# Patient Record
Sex: Female | Born: 1955 | Race: White | Hispanic: No | Smoking: Former smoker
Health system: Southern US, Community
[De-identification: ages and names within clinical notes are randomized; demographics above are authoritative.]

## PROBLEM LIST (undated history)

## (undated) DIAGNOSIS — I700 Atherosclerosis of aorta: Secondary | ICD-10-CM

## (undated) DIAGNOSIS — F419 Anxiety disorder, unspecified: Secondary | ICD-10-CM

## (undated) DIAGNOSIS — M5136 Other intervertebral disc degeneration, lumbar region: Secondary | ICD-10-CM

## (undated) DIAGNOSIS — I639 Cerebral infarction, unspecified: Secondary | ICD-10-CM

## (undated) DIAGNOSIS — E119 Type 2 diabetes mellitus without complications: Secondary | ICD-10-CM

## (undated) DIAGNOSIS — M549 Dorsalgia, unspecified: Secondary | ICD-10-CM

## (undated) DIAGNOSIS — M51369 Other intervertebral disc degeneration, lumbar region without mention of lumbar back pain or lower extremity pain: Secondary | ICD-10-CM

## (undated) DIAGNOSIS — F32A Depression, unspecified: Secondary | ICD-10-CM

## (undated) DIAGNOSIS — J449 Chronic obstructive pulmonary disease, unspecified: Secondary | ICD-10-CM

## (undated) DIAGNOSIS — E7800 Pure hypercholesterolemia, unspecified: Secondary | ICD-10-CM

## (undated) DIAGNOSIS — I10 Essential (primary) hypertension: Secondary | ICD-10-CM

## (undated) HISTORY — PX: CHOLECYSTECTOMY: SHX55

## (undated) HISTORY — PX: BREAST BIOPSY: SHX20

## (undated) HISTORY — PX: ABDOMINAL HYSTERECTOMY: SHX81

---

## 2004-03-02 ENCOUNTER — Ambulatory Visit: Payer: Self-pay | Admitting: General Surgery

## 2004-10-12 ENCOUNTER — Ambulatory Visit: Payer: Self-pay | Admitting: General Surgery

## 2008-04-21 ENCOUNTER — Ambulatory Visit: Payer: Self-pay | Admitting: Internal Medicine

## 2010-11-14 ENCOUNTER — Emergency Department: Payer: Self-pay | Admitting: Emergency Medicine

## 2010-12-10 ENCOUNTER — Emergency Department: Payer: Self-pay | Admitting: Emergency Medicine

## 2011-04-15 ENCOUNTER — Observation Stay: Payer: Self-pay | Admitting: Internal Medicine

## 2011-04-15 LAB — TROPONIN I
Troponin-I: <0.02 ng/mL
Troponin-I: <0.02 ng/mL

## 2011-04-15 LAB — CBC
MCH: 26.1 pg (ref 26.0–34.0)
MCHC: 32.2 g/dL (ref 32.0–36.0)
MCV: 81 fL (ref 80–100)
Platelet: 267 10*3/uL (ref 150–440)
RDW: 14.5 % (ref 11.5–14.5)
WBC: 10 10*3/uL (ref 3.6–11.0)

## 2011-04-15 LAB — CK TOTAL AND CKMB (NOT AT ARMC): CK, Total: 94 U/L (ref 21–215)

## 2011-04-15 LAB — BASIC METABOLIC PANEL
BUN: 17 mg/dL (ref 7–18)
Chloride: 97 mmol/L — ABNORMAL LOW (ref 98–107)
Co2: 26 mmol/L (ref 21–32)
Glucose: 170 mg/dL — ABNORMAL HIGH (ref 65–99)
Osmolality: 276 (ref 275–301)
Potassium: 4.5 mmol/L (ref 3.5–5.1)

## 2011-04-16 LAB — CBC WITH DIFFERENTIAL/PLATELET
Basophil #: 0.1 10*3/uL (ref 0.0–0.1)
Basophil %: 0.6 %
Eosinophil #: 0.1 10*3/uL (ref 0.0–0.7)
HCT: 34 % — ABNORMAL LOW (ref 35.0–47.0)
HGB: 11.1 g/dL — ABNORMAL LOW (ref 12.0–16.0)
Lymphocyte #: 2.4 10*3/uL (ref 1.0–3.6)
Lymphocyte %: 28.1 %
MCH: 26.1 pg (ref 26.0–34.0)
MCHC: 32.6 g/dL (ref 32.0–36.0)
MCV: 80 fL (ref 80–100)
Monocyte %: 5.5 %
Neutrophil %: 64.1 %
RDW: 14.9 % — ABNORMAL HIGH (ref 11.5–14.5)

## 2011-04-16 LAB — CK TOTAL AND CKMB (NOT AT ARMC)
CK, Total: 71 U/L (ref 21–215)
CK, Total: 73 U/L (ref 21–215)
CK-MB: 0.8 ng/mL (ref 0.5–3.6)
CK-MB: 0.9 ng/mL (ref 0.5–3.6)

## 2011-04-16 LAB — BASIC METABOLIC PANEL
Anion Gap: 12 (ref 7–16)
BUN: 15 mg/dL (ref 7–18)
Calcium, Total: 9 mg/dL (ref 8.5–10.1)
Creatinine: 1.18 mg/dL (ref 0.60–1.30)
EGFR (African American): >60
EGFR (Non-African Amer.): 50 — ABNORMAL LOW
Glucose: 144 mg/dL — ABNORMAL HIGH (ref 65–99)
Osmolality: 292 (ref 275–301)
Potassium: 4.3 mmol/L (ref 3.5–5.1)

## 2011-04-16 LAB — LIPID PANEL
Cholesterol: 118 mg/dL (ref 0–200)
Triglycerides: 105 mg/dL (ref 0–200)

## 2011-04-16 LAB — TROPONIN I: Troponin-I: <0.02 ng/mL

## 2012-12-10 ENCOUNTER — Emergency Department: Payer: Self-pay | Admitting: Emergency Medicine

## 2012-12-10 LAB — BASIC METABOLIC PANEL
Co2: 27 mmol/L (ref 21–32)
Creatinine: 0.8 mg/dL (ref 0.60–1.30)
EGFR (African American): >60
EGFR (Non-African Amer.): >60
Glucose: 227 mg/dL — ABNORMAL HIGH (ref 65–99)
Osmolality: 278 (ref 275–301)
Potassium: 4.1 mmol/L (ref 3.5–5.1)
Sodium: 135 mmol/L — ABNORMAL LOW (ref 136–145)

## 2012-12-10 LAB — CBC
HGB: 11.5 g/dL — ABNORMAL LOW (ref 12.0–16.0)
Platelet: 255 10*3/uL (ref 150–440)
RBC: 4.64 10*6/uL (ref 3.80–5.20)
RDW: 15.2 % — ABNORMAL HIGH (ref 11.5–14.5)
WBC: 11.4 10*3/uL — ABNORMAL HIGH (ref 3.6–11.0)

## 2012-12-10 LAB — TROPONIN I: Troponin-I: <0.02 ng/mL

## 2012-12-11 LAB — TROPONIN I: Troponin-I: <0.02 ng/mL

## 2013-01-20 IMAGING — CR DG CHEST 2V
1 series · 2 of 2 positions shown · non-contrast
Comparison: none

REASON FOR EXAM: SOB
COMMENTS:

PROCEDURE:     DXR - DXR CHEST PA (OR AP) AND LATERAL  - November 14, 2010  [DATE]
RESULT:     The lung fields are clear. No pneumonia, pneumothorax or pleural
effusion is seen. The heart size is normal. The mediastinal and osseous
structures show no significant abnormalities.

[Series 1: view not recorded · 0.17mm/px · 2 of 2 slices shown]
[im 1/2]
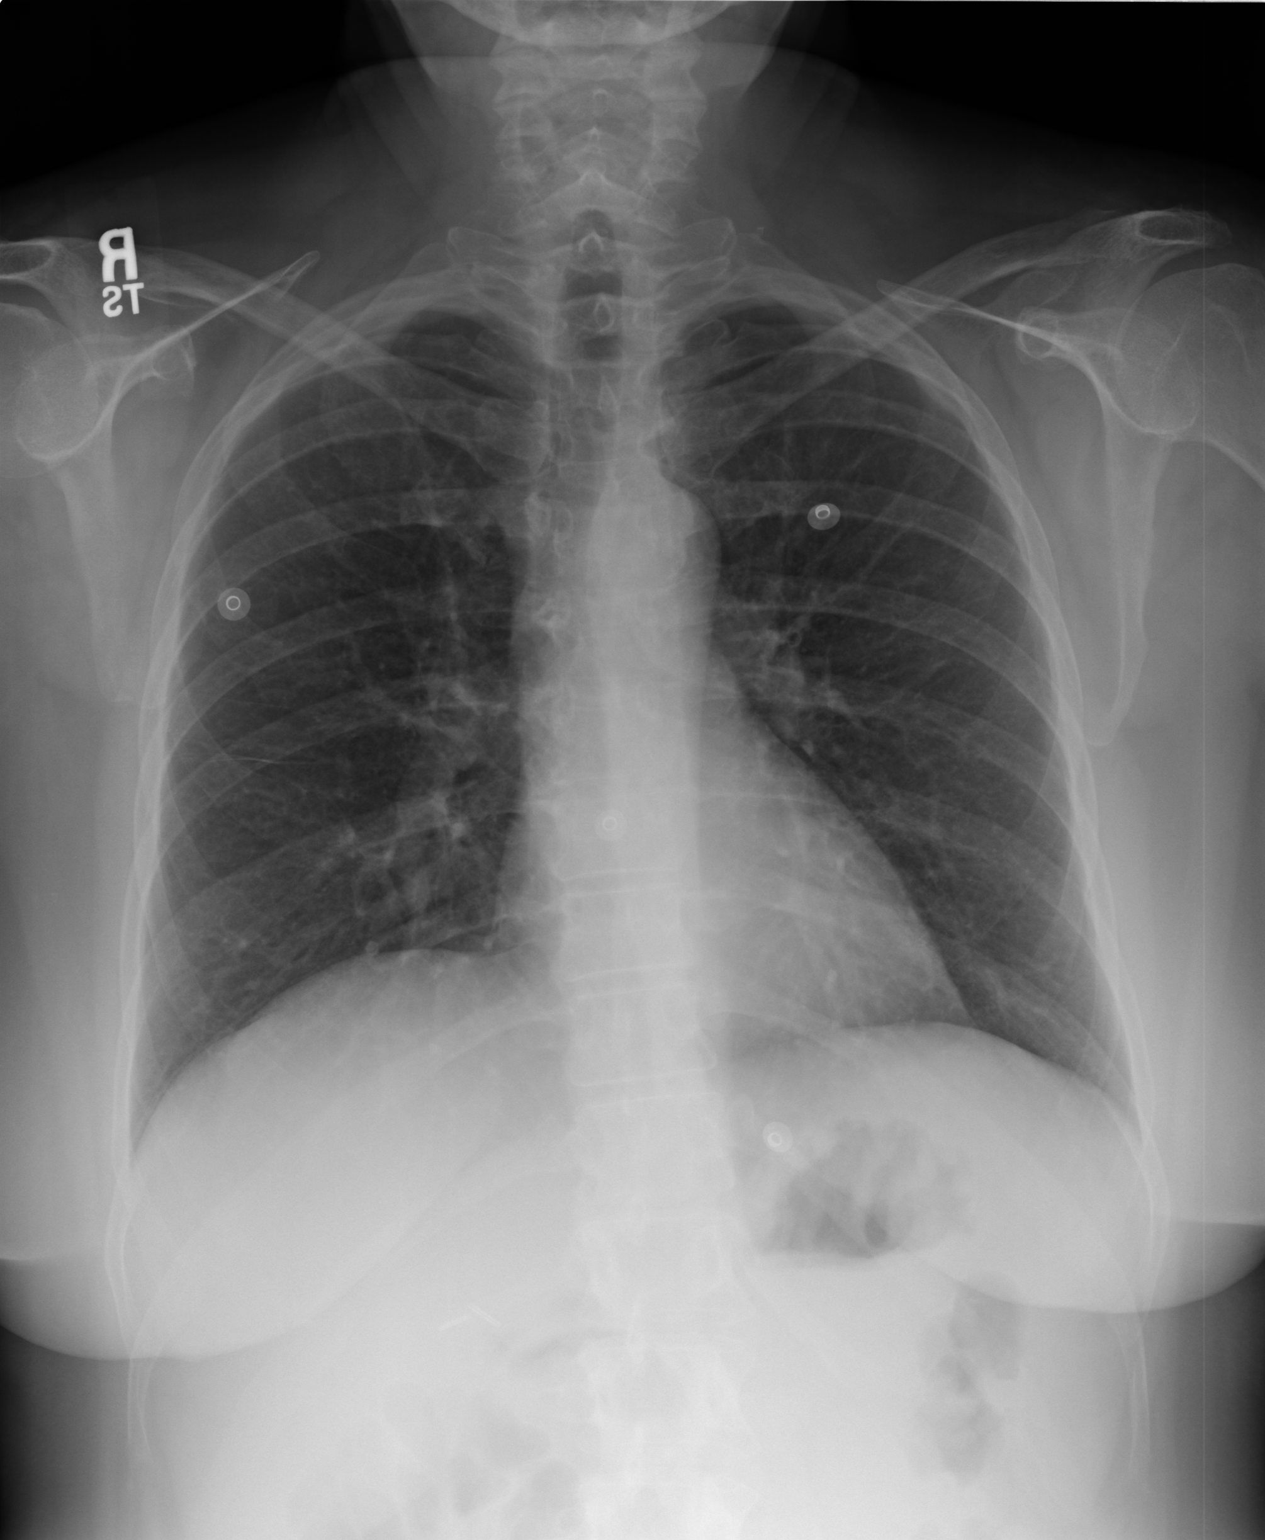
[im 2/2]
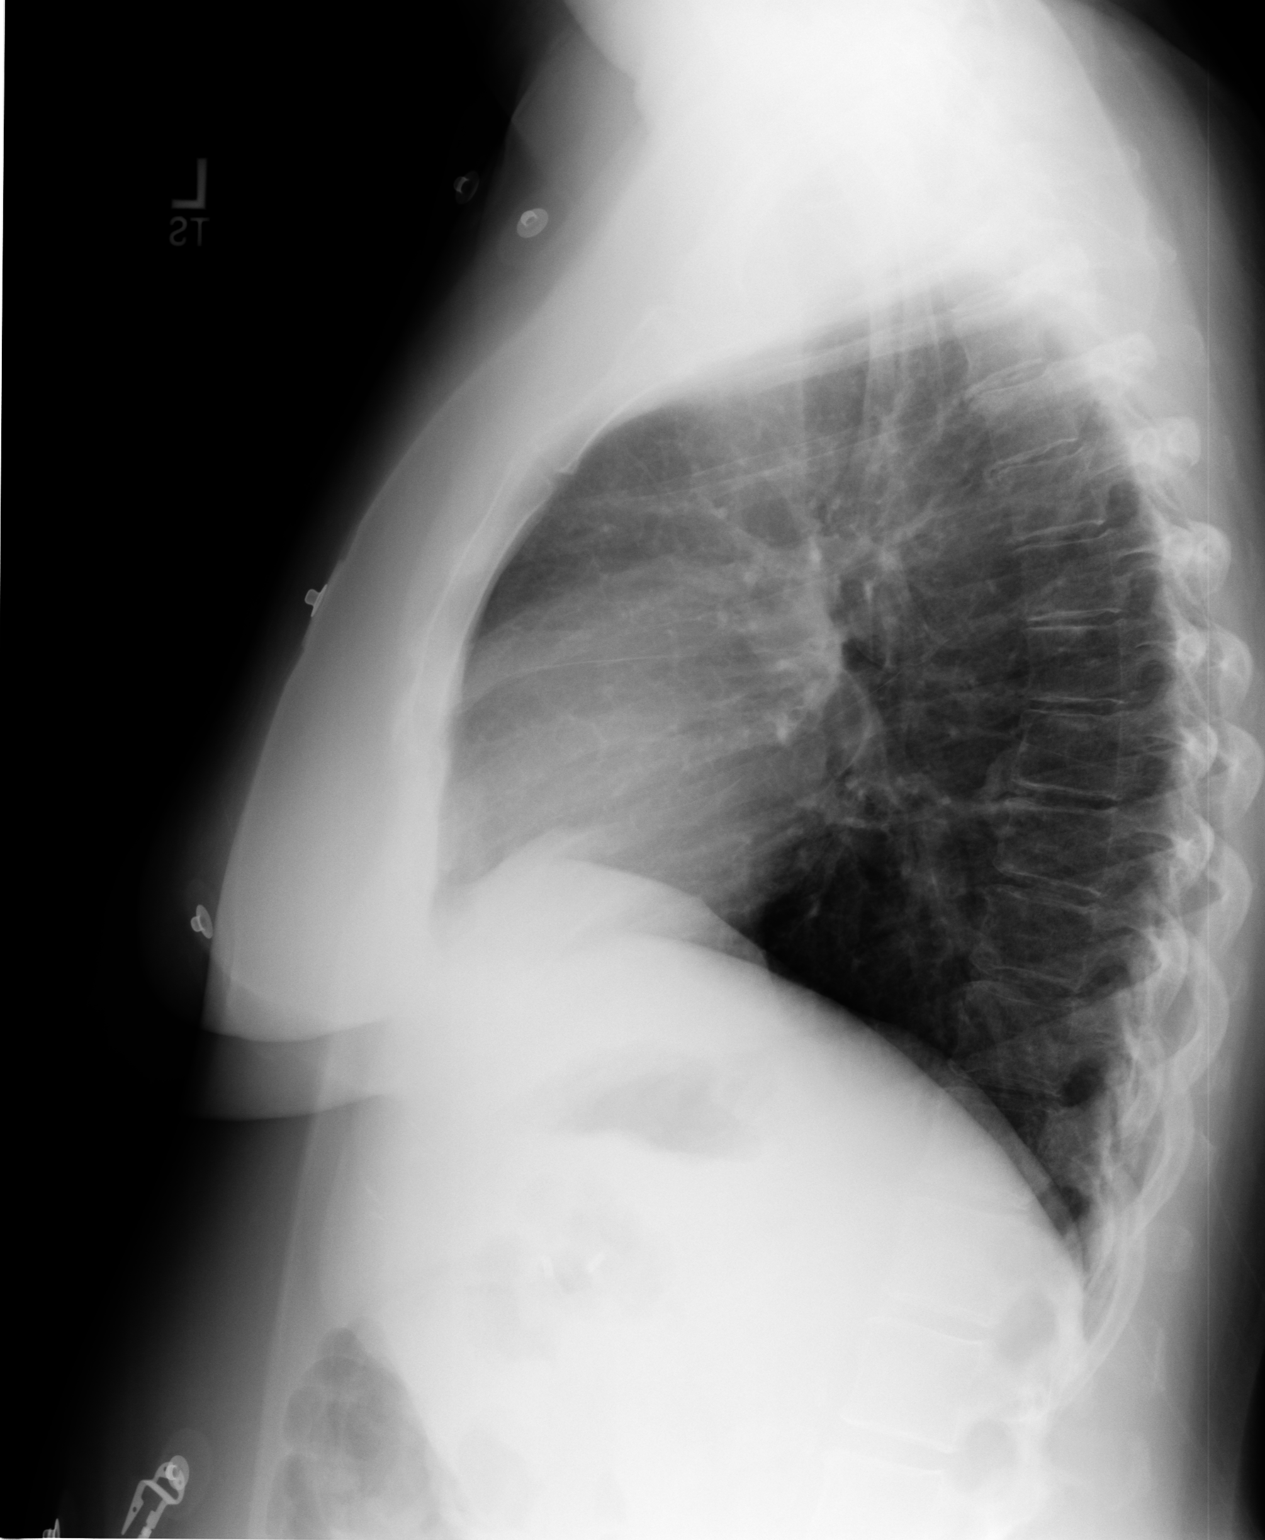

[2 of 2 positions shown; findings below may reference images not displayed]

IMPRESSION: No acute changes are identified.

## 2013-12-25 ENCOUNTER — Emergency Department: Payer: Self-pay | Admitting: Emergency Medicine

## 2013-12-25 LAB — BASIC METABOLIC PANEL
Anion Gap: 10 (ref 7–16)
BUN: 7 mg/dL (ref 7–18)
CALCIUM: 8.4 mg/dL — AB (ref 8.5–10.1)
CREATININE: 0.97 mg/dL (ref 0.60–1.30)
Chloride: 105 mmol/L (ref 98–107)
Co2: 24 mmol/L (ref 21–32)
EGFR (African American): >60
Glucose: 181 mg/dL — ABNORMAL HIGH (ref 65–99)
OSMOLALITY: 280 (ref 275–301)
Potassium: 3.8 mmol/L (ref 3.5–5.1)
SODIUM: 139 mmol/L (ref 136–145)

## 2013-12-25 LAB — CBC
HCT: 35.2 % (ref 35.0–47.0)
HGB: 11.2 g/dL — ABNORMAL LOW (ref 12.0–16.0)
MCH: 24.9 pg — ABNORMAL LOW (ref 26.0–34.0)
MCHC: 31.9 g/dL — ABNORMAL LOW (ref 32.0–36.0)
MCV: 78 fL — AB (ref 80–100)
PLATELETS: 240 10*3/uL (ref 150–440)
RBC: 4.5 10*6/uL (ref 3.80–5.20)
RDW: 15.7 % — AB (ref 11.5–14.5)
WBC: 8 10*3/uL (ref 3.6–11.0)

## 2013-12-25 LAB — TROPONIN I: Troponin-I: <0.02 ng/mL

## 2014-07-24 NOTE — H&P (Signed)
PATIENT NAME:  [name], [name] MR#:  [id number] DATE OF BIRTH:  [birth year]  DATE OF ADMISSION:  04/15/2011  PRIMARY CARE PHYSICIAN: Dr. Audria [facility]son at [facility]  CHIEF COMPLAINT: Tingling in her arm and chest pain.   HISTORY OF PRESENT ILLNESS: 59 year old female who has history of diabetes, hypertension, hyperlipidemia, anxiety, depression, chronic obstructive pulmonary disease. She quit smoking about 1 to 2 years ago. She was smoking for about 30. Today she presented to the Emergency Room. She started having some tingling of her left arm and started having midsternal chest pain. The chest pain she describes as hard. She was slightly nauseous and dizzy but no diaphoresis. She also has [name] dry cough she says for like [name] few weeks which she attributes to her chronic obstructive pulmonary disease. She said she never had this kind of chest pain before. When I asked her was she having an anxiety spells she said she had no reason to be anxious today though her husband says that this could be an anxiety what he thinks. She denies any abdominal pain. She denies any pleuritic chest pain. She got about 324 mg of aspirin in the Emergency Room, 1 mg of IV Ativan. She said she had no cardiac history in the past. She had [name] stress test probably [name] long time ago. So she is being admitted for chest pain with multiple risk factors. She got one dose of DuoNebs also in the Emergency Room. The chest pain and the symptoms happened at rest. She said everything started today. She denies having chest pain for last few days. She has mild dyspnea on exertion also maybe because of her chronic obstructive pulmonary disease.   REVIEW OF SYSTEMS: She denies any fever or chills. No acute change in vision. No headache. She is complaining of mild dizziness. She has cough. She has chronic obstructive pulmonary disease. She has some dyspnea. Today presents with chest pain. No palpitations or syncope. Had mild nausea. No  vomiting, diarrhea, abdominal pain. No dysuria. No frequency. No thyroid problems. No anemia. No easy bruisability or bleeding from any site. No rash. No joint pains or swelling. No focal numbness or weakness. She has anxiety, depression.   PAST MEDICAL HISTORY:  1. Chronic obstructive pulmonary disease. 2. Diabetes. 3. Hypertension. 4. Hyperlipidemia. 5. Anxiety, depression. 6. She was admitted in 2004. At that time she had abdominal wall cellulitis.   PAST SURGICAL HISTORY:  1. Hysterectomy.  2. Surgery for kidney stones.  3. Cholecystectomy.   ALLERGIES TO MEDICATIONS: None.   HOME MEDICATIONS: She does not have [name] list of medications with her but what I could gather from her:  1. Glipizide 10 mg daily.  2. Simvastatin. 3. Metformin 1000 mg twice [name] day. 4. Prilosec.  5. Albuterol as needed. 6. Advair Diskus. 7. Wellbutrin XL 150 mg daily. 8. Xanax 0.25 mg q.8 hours as needed. This needs to be confirmed. I asked her husband to bring the list.   SOCIAL HISTORY: [place]. She was [name] smoker since 59 years of age. She quit about 1 to 2 years ago. No alcohol. No drug use.   FAMILY HISTORY: Her dad died of MI in 7570s. Mother had lung cancer.  PHYSICAL EXAMINATION: VITAL SIGNS: Her vitals when she presented to the Emergency Room include: Temperature 97, heart rate 71, respiratory rate 20, blood pressure 161/70, saturating 98% on room air.   GENERAL: This is [name] middle-aged obese Caucasian female, slightly anxious  in appearance, comfortably lying in bed. She still has some lingering chest pain.   HEENT: Bilateral pupils are equal. Extraocular muscles are intact. No scleral icterus. No conjunctivitis. Oral mucosa is moist. No pallor.   NECK: No thyroid tenderness, enlargement or nodule. Neck is supple. No masses, nontender. No adenopathy. No JVD. No carotid bruits.   CHEST: Bilateral breath sounds, she has coarse breath sounds. No wheezing appreciable.  Normal respiratory effort. Not using accessory muscles of respiration.   HEART: Heart sounds are regular. No murmur. Good peripheral pulses. No lower extremity edema.   ABDOMEN: Soft, nontender. Normal bowel sounds. No hepatomegaly. No bruit. No masses.  RECTAL: Deferred.   NEUROLOGIC: She is awake, alert, oriented to time, place, and person. Anxious. Cranial nerves are intact. Moving all extremities against gravity.   EXTREMITIES: No cyanosis. No clubbing.   SKIN: No rash. No lesions.   LABORATORY, DIAGNOSTIC AND RADIOLOGICAL DATA: White count 10, hemoglobin 11.1, platelet count 267,000. BMP: Sodium 135, potassium 4.5, BUN 17, creatinine 0.96. Troponin is negative, two sets. Chest x-ray essentially negative. D-dimer is pending. Her EKG shows that she has some sinus rhythm. She has nonspecific T wave changes, some flattening of the T waves in the precordial leads but no acute ischemic changes, unchanged from prior EKGs.   IMPRESSION:  1. Chest pain with multiple risk factors of chronic obstructive pulmonary disease, diabetes, hypertension and hyperlipidemia. Rule out myocardial infarction. Rule out coronary artery disease. 2. Diabetes. 3. Hypertension. 4. Hyperlipidemia. 5. Chronic obstructive pulmonary disease. 6. Anxiety, depression.  PLAN: 59 year old obese female with history of diabetes, hypertension, hyperlipidemia, and chronic obstructive pulmonary disease. She was [name] chronic smoker. She comes with some tingling in her left arm and midsternal chest pain at rest. She got 324 mg of aspirin. I am going to continue aspirin on her. Give her some nitro paste to see whether that helps her chest pain or not. Will check an hemoglobin A1c and lipid profile in the morning. Get serial cardiac enzymes on her if her cardiac enzymes are negative then I am going to do [name] Myoview in the morning considering multiple risk factors. She also has [name] cough and she has chronic obstructive pulmonary disease. I am  going to give her some DuoNebs and continue her Advair at this time. I do not think she needs steroids at this time. Will continue her Wellbutrin and Xanax for anxiety. I am going to continue glipizide but hold metformin in case she needs any contrast study and continue her PPI. I am not starting her on any beta blockers this time considering the fact that she has chronic obstructive pulmonary disease. Plan was discussed with the patient and the husband in detail. I will admit her to observation, telemetry.   TIME SPENT WITH ADMISSION AND COORDINATION: 50 minutes.  ____________________________ Fredia Sorrow, MD ag:cms D: 04/15/2011 20:19:26 ET T: 04/16/2011 06:40:24 ET JOB#: [id number]  cc: Fredia Sorrow, MD, <Dictator> Dr. Audria Nine at [facility] Fredia Sorrow MD ELECTRONICALLY SIGNED 04/26/2011 10:55

## 2014-07-24 NOTE — Discharge Summary (Signed)
PATIENT NAME:  Kayla ShanksDAVIS, Kayla A MR#:  [id number] DATE OF BIRTH:  04-08-1955  DATE OF ADMISSION:  04/15/2011 DATE OF DISCHARGE:  04/16/2011  DISCHARGE DIAGNOSES:  1. Atypical chest pain, likely noncardiac, unclear etiology.  2. Diabetes.  3. Hypertension.  4. Hyperlipidemia.  5. Anxiety.  6. Depression.  7. Ex-smoker.  8. History of chronic obstructive pulmonary disease.   DISPOSITION: The patient is being discharged home.   FOLLOWUP: Follow up with primary care physician in 1 to 2 weeks after discharge.   DIET: Low sodium, 1800 calorie ADA diet.   ACTIVITY: As tolerated.   DISCHARGE MEDICATIONS:  1. Advair 250/50, 1 puff b.i.d.  2. Combivent 2 puffs every 6 hours p.r.n.  3. DuoNebs every 6 hours p.r.n.  4. Aspirin 81 mg daily.  5. Omeprazole 20 mg daily.  6. Metformin 1000 mg b.i.d.  7. Xanax 0.25 mg t.i.d.  8. Wellbutrin 150 mg daily.  9. Glipizide 10 mg daily.  10. Lipitor 20 mg daily.   LABORATORY, DIAGNOSTIC AND RADIOLOGICAL DATA:  Inpatient nuclear stress test showed no evidence of any ischemia. Ejection fraction of 72%.  Serial cardiac enzymes normal.  Hemoglobin A1c 7.3.  Essentially normal CBC other than hemoglobin of 11.3.  Normal complete metabolic panel.  Serial cardiac enzymes negative.  Normal cholesterol profile.  Chest x-ray showed no evidence of any acute cardiopulmonary disease.  CT of the chest with contrast showed no evidence of PE.   HOSPITAL COURSE: The patient is a 59 year old female with a history of diabetes, hypertension, and hyperlipidemia who presented with atypical chest pain. She was admitted to the hospital and monitored on telemetry. She had no events on telemetry.  Her serial cardiac enzymes were negative. She underwent an inpatient stress test which showed no evidence of ischemia. Her cholesterol profile was checked and was normal. Her hemoglobin A1c was 7.3. Her hypertension remained well controlled during the hospitalization, as did her  chronic obstructive pulmonary disease.   She is being discharged home in a stable condition. She will follow up with her primary care physician in 1 to 2 weeks after discharge.   TIME SPENT: 45 minutes   ____________________________ Darrick MeigsSangeeta Kayzen Kendzierski, MD sp:cbb D: 04/16/2011 16:41:28 ET T: 04/17/2011 10:24:32 ET JOB#: [id number]  cc: Darrick MeigsSangeeta Elisandro Jarrett, MD, <Dictator> Darrick MeigsSANGEETA Ramah Langhans MD ELECTRONICALLY SIGNED 04/17/2011 16:27

## 2014-10-27 ENCOUNTER — Emergency Department: Payer: Medicare Other

## 2014-10-27 ENCOUNTER — Inpatient Hospital Stay
Admission: EM | Admit: 2014-10-27 | Discharge: 2014-10-29 | DRG: 193 | Disposition: A | Discharge status: Home / Self Care | Payer: Medicare Other | Attending: Internal Medicine | Admitting: Internal Medicine

## 2014-10-27 DIAGNOSIS — E119 Type 2 diabetes mellitus without complications: Secondary | ICD-10-CM | POA: Diagnosis present

## 2014-10-27 DIAGNOSIS — J440 Chronic obstructive pulmonary disease with acute lower respiratory infection: Secondary | ICD-10-CM | POA: Diagnosis present

## 2014-10-27 DIAGNOSIS — Z87891 Personal history of nicotine dependence: Secondary | ICD-10-CM | POA: Diagnosis not present

## 2014-10-27 DIAGNOSIS — J441 Chronic obstructive pulmonary disease with (acute) exacerbation: Secondary | ICD-10-CM | POA: Diagnosis present

## 2014-10-27 DIAGNOSIS — I10 Essential (primary) hypertension: Secondary | ICD-10-CM | POA: Diagnosis present

## 2014-10-27 DIAGNOSIS — Z8673 Personal history of transient ischemic attack (TIA), and cerebral infarction without residual deficits: Secondary | ICD-10-CM | POA: Diagnosis not present

## 2014-10-27 DIAGNOSIS — J189 Pneumonia, unspecified organism: Principal | ICD-10-CM | POA: Diagnosis present

## 2014-10-27 DIAGNOSIS — E780 Pure hypercholesterolemia: Secondary | ICD-10-CM | POA: Diagnosis present

## 2014-10-27 DIAGNOSIS — J9601 Acute respiratory failure with hypoxia: Secondary | ICD-10-CM | POA: Diagnosis present

## 2014-10-27 HISTORY — DX: Pure hypercholesterolemia, unspecified: E78.00

## 2014-10-27 HISTORY — DX: Type 2 diabetes mellitus without complications: E11.9

## 2014-10-27 HISTORY — DX: Cerebral infarction, unspecified: I63.9

## 2014-10-27 HISTORY — DX: Chronic obstructive pulmonary disease, unspecified: J44.9

## 2014-10-27 HISTORY — DX: Dorsalgia, unspecified: M54.9

## 2014-10-27 HISTORY — DX: Essential (primary) hypertension: I10

## 2014-10-27 LAB — COMPREHENSIVE METABOLIC PANEL
ALT: 11 U/L — AB (ref 14–54)
AST: 32 U/L (ref 15–41)
Albumin: 3.7 g/dL (ref 3.5–5.0)
Alkaline Phosphatase: 88 U/L (ref 38–126)
Anion gap: 15 (ref 5–15)
BUN: 18 mg/dL (ref 6–20)
CALCIUM: 9.3 mg/dL (ref 8.9–10.3)
CO2: 24 mmol/L (ref 22–32)
Chloride: 99 mmol/L — ABNORMAL LOW (ref 101–111)
Creatinine, Ser: 0.95 mg/dL (ref 0.44–1.00)
GFR calc Af Amer: >60 mL/min (ref >60)
Glucose, Bld: 346 mg/dL — ABNORMAL HIGH (ref 65–99)
Potassium: 4.7 mmol/L (ref 3.5–5.1)
Sodium: 138 mmol/L (ref 135–145)
TOTAL PROTEIN: 7.4 g/dL (ref 6.5–8.1)
Total Bilirubin: 0.3 mg/dL (ref 0.3–1.2)

## 2014-10-27 LAB — GLUCOSE, CAPILLARY: Glucose-Capillary: 359 mg/dL — ABNORMAL HIGH (ref 65–99)

## 2014-10-27 LAB — BLOOD GAS, ARTERIAL
ALLENS TEST (PASS/FAIL): POSITIVE — AB
Acid-base deficit: 1.7 mmol/L (ref 0.0–2.0)
Bicarbonate: 22.1 mEq/L (ref 21.0–28.0)
FIO2: 0.21
O2 Saturation: 93.1 %
Patient temperature: 37
pCO2 arterial: 34 mmHg (ref 32.0–48.0)
pH, Arterial: 7.42 (ref 7.350–7.450)
pO2, Arterial: 66 mmHg — ABNORMAL LOW (ref 83.0–108.0)

## 2014-10-27 LAB — CBC
HEMATOCRIT: 34.8 % — AB (ref 35.0–47.0)
Hemoglobin: 11 g/dL — ABNORMAL LOW (ref 12.0–16.0)
MCH: 25.5 pg — ABNORMAL LOW (ref 26.0–34.0)
MCHC: 31.5 g/dL — ABNORMAL LOW (ref 32.0–36.0)
MCV: 80.8 fL (ref 80.0–100.0)
PLATELETS: 258 10*3/uL (ref 150–440)
RBC: 4.31 MIL/uL (ref 3.80–5.20)
RDW: 15.7 % — ABNORMAL HIGH (ref 11.5–14.5)
WBC: 20.2 10*3/uL — ABNORMAL HIGH (ref 3.6–11.0)

## 2014-10-27 LAB — BRAIN NATRIURETIC PEPTIDE: B NATRIURETIC PEPTIDE 5: 63 pg/mL (ref 0.0–100.0)

## 2014-10-27 LAB — TROPONIN I: Troponin I: <0.03 ng/mL (ref <0.031)

## 2014-10-27 MED ORDER — INSULIN ASPART 100 UNIT/ML ~~LOC~~ SOLN
0.0000 [IU] | Freq: Every day | SUBCUTANEOUS | Status: DC
  Administered 2014-10-27: 22:00:00 5 [IU] via SUBCUTANEOUS
  Filled 2014-10-27: qty 5

## 2014-10-27 MED ORDER — CEFTRIAXONE SODIUM IN DEXTROSE 20 MG/ML IV SOLN
1.0000 g | INTRAVENOUS | Status: DC
  Filled 2014-10-27: qty 50

## 2014-10-27 MED ORDER — FLUTICASONE PROPIONATE HFA 110 MCG/ACT IN AERO
2.0000 | INHALATION_SPRAY | Freq: Two times a day (BID) | RESPIRATORY_TRACT | Status: DC
  Administered 2014-10-27 – 2014-10-29 (×4): 2 via RESPIRATORY_TRACT
  Filled 2014-10-27: qty 12

## 2014-10-27 MED ORDER — PANTOPRAZOLE SODIUM 40 MG PO TBEC
40.0000 mg | DELAYED_RELEASE_TABLET | Freq: Every day | ORAL | Status: DC
  Administered 2014-10-27 – 2014-10-29 (×3): 40 mg via ORAL
  Filled 2014-10-27 (×3): qty 1

## 2014-10-27 MED ORDER — INSULIN ASPART 100 UNIT/ML ~~LOC~~ SOLN
0.0000 [IU] | Freq: Three times a day (TID) | SUBCUTANEOUS | Status: DC
  Administered 2014-10-28 (×3): 11 [IU] via SUBCUTANEOUS
  Administered 2014-10-29: 13:00:00 8 [IU] via SUBCUTANEOUS
  Administered 2014-10-29: 5 [IU] via SUBCUTANEOUS
  Filled 2014-10-27: qty 11
  Filled 2014-10-27: qty 5
  Filled 2014-10-27: qty 8
  Filled 2014-10-27: qty 15
  Filled 2014-10-27: qty 11

## 2014-10-27 MED ORDER — GLIPIZIDE ER 5 MG PO TB24
10.0000 mg | ORAL_TABLET | Freq: Every day | ORAL | Status: DC
  Administered 2014-10-27 – 2014-10-29 (×3): 10 mg via ORAL
  Filled 2014-10-27 (×3): qty 2

## 2014-10-27 MED ORDER — LISDEXAMFETAMINE DIMESYLATE 70 MG PO CAPS: 70.0000 mg | ORAL_CAPSULE | Freq: Every day | ORAL | Status: DC

## 2014-10-27 MED ORDER — GI COCKTAIL ~~LOC~~
30.0000 mL | Freq: Once | ORAL | Status: AC
  Administered 2014-10-27: 30 mL via ORAL
  Filled 2014-10-27: qty 30

## 2014-10-27 MED ORDER — GABAPENTIN 100 MG PO CAPS
100.0000 mg | ORAL_CAPSULE | Freq: Every morning | ORAL | Status: DC
  Administered 2014-10-28 – 2014-10-29 (×2): 100 mg via ORAL
  Filled 2014-10-27 (×3): qty 1

## 2014-10-27 MED ORDER — ENOXAPARIN SODIUM 40 MG/0.4ML ~~LOC~~ SOLN
40.0000 mg | SUBCUTANEOUS | Status: DC
  Administered 2014-10-28: 40 mg via SUBCUTANEOUS
  Filled 2014-10-27: qty 0.4

## 2014-10-27 MED ORDER — GABAPENTIN 300 MG PO CAPS
900.0000 mg | ORAL_CAPSULE | Freq: Every day | ORAL | Status: DC
  Administered 2014-10-27 – 2014-10-28 (×2): 900 mg via ORAL
  Filled 2014-10-27 (×2): qty 3

## 2014-10-27 MED ORDER — METHYLPREDNISOLONE SODIUM SUCC 125 MG IJ SOLR
INTRAMUSCULAR | Status: AC
  Administered 2014-10-27: 125 mg
  Filled 2014-10-27: qty 2

## 2014-10-27 MED ORDER — ALPRAZOLAM 1 MG PO TABS
1.0000 mg | ORAL_TABLET | Freq: Four times a day (QID) | ORAL | Status: DC | PRN
  Administered 2014-10-27 – 2014-10-28 (×2): 1 mg via ORAL
  Filled 2014-10-27 (×2): qty 1

## 2014-10-27 MED ORDER — DEXTROSE 5 % IV SOLN
500.0000 mg | INTRAVENOUS | Status: DC
  Administered 2014-10-28: 19:00:00 500 mg via INTRAVENOUS
  Filled 2014-10-27 (×2): qty 500

## 2014-10-27 MED ORDER — CITALOPRAM HYDROBROMIDE 20 MG PO TABS
20.0000 mg | ORAL_TABLET | Freq: Every day | ORAL | Status: DC
  Administered 2014-10-27 – 2014-10-29 (×3): 20 mg via ORAL
  Filled 2014-10-27 (×3): qty 1

## 2014-10-27 MED ORDER — DEXTROSE 5 % IV SOLN
1.0000 g | Freq: Once | INTRAVENOUS | Status: AC
  Administered 2014-10-27: 1 g via INTRAVENOUS
  Filled 2014-10-27: qty 10

## 2014-10-27 MED ORDER — AMPHETAMINE-DEXTROAMPHETAMINE 5 MG PO TABS
30.0000 mg | ORAL_TABLET | Freq: Two times a day (BID) | ORAL | Status: DC
  Administered 2014-10-27 – 2014-10-29 (×4): 30 mg via ORAL
  Filled 2014-10-27 (×4): qty 6

## 2014-10-27 MED ORDER — DEXTROSE 5 % IV SOLN
500.0000 mg | Freq: Once | INTRAVENOUS | Status: AC
  Administered 2014-10-27: 500 mg via INTRAVENOUS
  Filled 2014-10-27: qty 500

## 2014-10-27 MED ORDER — IPRATROPIUM-ALBUTEROL 0.5-2.5 (3) MG/3ML IN SOLN
RESPIRATORY_TRACT | Status: AC
  Administered 2014-10-27: 17:00:00
  Filled 2014-10-27: qty 6

## 2014-10-27 MED ORDER — OXYCODONE-ACETAMINOPHEN 7.5-325 MG PO TABS
1.0000 | ORAL_TABLET | ORAL | Status: DC | PRN
  Administered 2014-10-27 – 2014-10-29 (×5): 1 via ORAL
  Filled 2014-10-27 (×5): qty 1

## 2014-10-27 MED ORDER — LISINOPRIL 5 MG PO TABS
5.0000 mg | ORAL_TABLET | Freq: Every day | ORAL | Status: DC
  Administered 2014-10-27 – 2014-10-29 (×3): 5 mg via ORAL
  Filled 2014-10-27 (×3): qty 1

## 2014-10-27 MED ORDER — IPRATROPIUM-ALBUTEROL 0.5-2.5 (3) MG/3ML IN SOLN
3.0000 mL | Freq: Four times a day (QID) | RESPIRATORY_TRACT | Status: DC
  Administered 2014-10-28 – 2014-10-29 (×7): 3 mL via RESPIRATORY_TRACT
  Filled 2014-10-27 (×7): qty 3

## 2014-10-27 MED ORDER — SODIUM CHLORIDE 0.9 % IV SOLN
INTRAVENOUS | Status: DC
  Administered 2014-10-27 – 2014-10-29 (×3): via INTRAVENOUS

## 2014-10-27 MED ORDER — LISDEXAMFETAMINE DIMESYLATE 30 MG PO CAPS
70.0000 mg | ORAL_CAPSULE | Freq: Every day | ORAL | Status: DC
  Administered 2014-10-27: 70 mg via ORAL
  Filled 2014-10-27: qty 1

## 2014-10-27 MED ORDER — TIOTROPIUM BROMIDE MONOHYDRATE 18 MCG IN CAPS
18.0000 ug | ORAL_CAPSULE | Freq: Every day | RESPIRATORY_TRACT | Status: DC
  Administered 2014-10-28 – 2014-10-29 (×2): 18 ug via RESPIRATORY_TRACT
  Filled 2014-10-27: qty 5

## 2014-10-27 NOTE — ED Notes (Signed)
meds given.  Pt continues to have a cough.  nsr on monitor.  Pt anxious.

## 2014-10-27 NOTE — H&P (Signed)
[name] is an 59 y.o. female.   Chief Complaint: Shortness of breath HPI: Patient spent time outside yesterday and began having shortness of breath. Not relieved by inhalers. Has had non productive cough. Feels burning in chest since this started, Was found to have pneumonia on CXR.  Past Medical History  Diagnosis Date  . COPD (chronic obstructive pulmonary disease)   . Hypertension   . Diabetes mellitus without complication   . Hypercholesteremia   . Back pain   . Stroke     History reviewed. No pertinent past surgical history.  No family history on file. Social History:  reports that she has quit smoking. She does not have any smokeless tobacco history on file. She reports that she does not drink alcohol. Her drug history is not on file.  Allergies: No Known Allergies   (Not in a hospital admission)  Results for orders placed or performed during the hospital encounter of 10/27/14 (from the past 48 hour(s))  CBC     Status: Abnormal   Collection Time: 10/27/14  5:08 PM  Result Value Ref Range   WBC 20.2 (H) 3.6 - 11.0 K/uL   RBC 4.31 3.80 - 5.20 MIL/uL   Hemoglobin 11.0 (L) 12.0 - 16.0 g/dL   HCT 34.8 (L) 35.0 - 47.0 %   MCV 80.8 80.0 - 100.0 fL   MCH 25.5 (L) 26.0 - 34.0 pg   MCHC 31.5 (L) 32.0 - 36.0 g/dL   RDW 15.7 (H) 11.5 - 14.5 %   Platelets 258 150 - 440 K/uL  Comprehensive metabolic panel     Status: Abnormal   Collection Time: 10/27/14  5:08 PM  Result Value Ref Range   Sodium 138 135 - 145 mmol/L   Potassium 4.7 3.5 - 5.1 mmol/L   Chloride 99 (L) 101 - 111 mmol/L   CO2 24 22 - 32 mmol/L   Glucose, Bld 346 (H) 65 - 99 mg/dL   BUN 18 6 - 20 mg/dL   Creatinine, Ser 0.95 0.44 - 1.00 mg/dL   Calcium 9.3 8.9 - 10.3 mg/dL   Total Protein 7.4 6.5 - 8.1 g/dL   Albumin 3.7 3.5 - 5.0 g/dL   AST 32 15 - 41 U/L   ALT 11 (L) 14 - 54 U/L   Alkaline Phosphatase 88 38 - 126 U/L   Total Bilirubin 0.3 0.3 - 1.2 mg/dL   GFR calc non Af Amer >60 >60 mL/min   GFR  calc Af Amer >60 >60 mL/min    Comment: (NOTE) The eGFR has been calculated using the CKD EPI equation. This calculation has not been validated in all clinical situations. eGFR's persistently <60 mL/min signify possible Chronic Kidney Disease.    Anion gap 15 5 - 15  Brain natriuretic peptide (only with dyspnea)     Status: None   Collection Time: 10/27/14  5:08 PM  Result Value Ref Range   B Natriuretic Peptide 63.0 0.0 - 100.0 pg/mL  Troponin I     Status: None   Collection Time: 10/27/14  5:08 PM  Result Value Ref Range   Troponin I <0.03 <0.031 ng/mL    Comment:        NO INDICATION OF MYOCARDIAL INJURY.   Blood gas, arterial     Status: Abnormal   Collection Time: 10/27/14  5:20 PM  Result Value Ref Range   FIO2 0.21    pH, Arterial 7.42 7.350 - 7.450   pCO2 arterial 34 32.0 - 48.0  mmHg   pO2, Arterial 66 (L) 83.0 - 108.0 mmHg   Bicarbonate 22.1 21.0 - 28.0 mEq/L   Acid-base deficit 1.7 0.0 - 2.0 mmol/L   O2 Saturation 93.1 %   Patient temperature 37.0    Collection site RIGHT RADIAL    Sample type ARTERIAL DRAW    Allens test (pass/fail) POSITIVE (A) PASS   Dg Chest Portable 1 View  10/27/2014   CLINICAL DATA:  Dyspnea, onset yesterday.  Worsened today.  EXAM: PORTABLE CHEST - 1 VIEW  COMPARISON:  12/25/2013  FINDINGS: There is patchy ground-glass opacity in the left base adjacent to the cardiac apex. This is new. It could represent infectious infiltrate. The right lung is clear. There is no large effusion. Heart size is normal. Hilar and mediastinal contours are normal. Pulmonary vasculature is normal.  IMPRESSION: Patchy infiltrate in the left base, possibly infectious. Followup PA and lateral chest X-ray is recommended in 3-4 weeks following trial of antibiotic therapy to ensure resolution and exclude underlying malignancy.   Electronically Signed   By: Daniel R Mitchell M.D.   On: 10/27/2014 17:35    Review of Systems  Constitutional: Positive for chills. Negative  for fever.  HENT: Negative for hearing loss and sore throat.   Eyes: Negative for blurred vision.  Respiratory: Positive for cough, shortness of breath and wheezing.   Cardiovascular: Negative for chest pain and leg swelling.  Gastrointestinal: Positive for nausea. Negative for vomiting and diarrhea.  Genitourinary: Negative for dysuria.  Musculoskeletal: Negative for back pain.  Skin: Negative for rash.  Neurological: Negative for sensory change, focal weakness and weakness.  Psychiatric/Behavioral: Negative for depression.    Blood pressure 129/67, pulse 111, temperature 98.1 F (36.7 C), resp. rate 26, height 5' 3" (1.6 m), weight 74.844 kg (165 lb), SpO2 100 %. Physical Exam  Constitutional: She is oriented to person, place, and time. She appears well-developed and well-nourished. No distress.  HENT:  Head: Normocephalic.  Mouth/Throat: Oropharyngeal exudate present.  Eyes: EOM are normal. Pupils are equal, round, and reactive to light. No scleral icterus.  Neck: No JVD present. No thyromegaly present.  Cardiovascular: Normal rate.   No murmur heard. Respiratory: She is in respiratory distress. She has wheezes. She exhibits no tenderness.  Scattered rhonci. Mild use of accessory muscles.  GI: Soft. Bowel sounds are normal. She exhibits no distension and no mass.  Musculoskeletal: She exhibits no edema or tenderness.  Lymphadenopathy:    She has no cervical adenopathy.  Neurological: She is alert and oriented to person, place, and time.  Skin: Skin is warm and dry. No rash noted.     Assessment/Plan 1. Pneumonia: Suspect bacterial. Will start IV abx. Check blood cultures.  2. Sepsis: Tachycardic, high WBC . Will give supportive care and treat underlying cause.  3. Acute Respiratory Failure: Hypoxic on presentation and using accessory muscles to breath. Will suppliment oxygen for now.  4. COPD Exacerbation: Start scheduled nebs. Do not see need for steriods at this  point.  5. DM: Hold metformin. SSI.  Time spent=55min  Johnston,  John D 10/27/2014, 8:14 PM    

## 2014-10-27 NOTE — ED Provider Notes (Signed)
[facility] Emergency Department Provider Note  ____________________________________________  Time seen: Approximately 6:07 PM  I have reviewed the triage vital signs and the nursing notes.   HISTORY  Chief Complaint Shortness of Breath and Wheezing    HPI GAETANA [name] is a 59 y.o. female patient reports she quit smoking 7 years ago came a little bit short of breath yesterday much worse today also complains of burning in the epigastric area going through the back. Patient came in in respiratory distress. Although she has oxygen saturation about 99% on room air. Patient was using excessive accessory muscles on arrival and had some wheezing and decreased breath sounds. After breathing treatment and some time her oxygen saturation are still 99% on room air and is more calm there is slight wheezing with deep inspiration patient reports occasional cough which is nonproductive. No chest tightness she says   Past Medical History  Diagnosis Date  . COPD (chronic obstructive pulmonary disease)   . Hypertension   . Diabetes mellitus without complication   . Hypercholesteremia   . Back pain   . Stroke     There are no active problems to display for this patient.   History reviewed. No pertinent past surgical history.  Current Outpatient Rx  Name  Route  Sig  Dispense  Refill  . albuterol (PROVENTIL HFA;VENTOLIN HFA) 108 (90 BASE) MCG/ACT inhaler   Inhalation   Inhale 2 puffs into the lungs 4 (four) times daily as needed for wheezing or shortness of breath.         . ALPRAZolam (XANAX) 1 MG tablet   Oral   Take 1 mg by mouth every 6 (six) hours as needed for anxiety.         Marland Kitchen amphetamine-dextroamphetamine (ADDERALL) 30 MG tablet   Oral   Take 30 mg by mouth 2 (two) times daily.         . citalopram (CELEXA) 20 MG tablet   Oral   Take 20 mg by mouth daily.         Marland Kitchen esomeprazole (NEXIUM) 40 MG capsule   Oral   Take 40 mg by mouth daily at  12 noon.         . fluticasone (FLONASE) 50 MCG/ACT nasal spray   Each Nare   Place 1-2 sprays into both nostrils 2 (two) times daily as needed for allergies or rhinitis.         . fluticasone (FLOVENT HFA) 110 MCG/ACT inhaler   Inhalation   Inhale 2 puffs into the lungs 2 (two) times daily.         Marland Kitchen gabapentin (NEURONTIN) 100 MG capsule   Oral   Take 100 mg by mouth every morning.         . gabapentin (NEURONTIN) 300 MG capsule   Oral   Take 900 mg by mouth at bedtime.         Marland Kitchen glipiZIDE (GLUCOTROL XL) 10 MG 24 hr tablet   Oral   Take 10 mg by mouth daily.         Marland Kitchen ibuprofen (ADVIL,MOTRIN) 800 MG tablet   Oral   Take 800 mg by mouth every 8 (eight) hours as needed for moderate pain.         Marland Kitchen ipratropium-albuterol (DUONEB) 0.5-2.5 (3) MG/3ML SOLN   Nebulization   Take 3 mLs by nebulization 4 (four) times daily as needed (for shortness of breath).         Marland Kitchen  lisdexamfetamine (VYVANSE) 70 MG capsule   Oral   Take 70 mg by mouth daily.         Marland Kitchen lisinopril (PRINIVIL,ZESTRIL) 5 MG tablet   Oral   Take 5 mg by mouth daily.         . metFORMIN (GLUCOPHAGE) 1000 MG tablet   Oral   Take 1,000 mg by mouth 2 (two) times daily with a meal.         . oxyCODONE-acetaminophen (PERCOCET) 7.5-325 MG per tablet   Oral   Take 1 tablet by mouth every 4 (four) hours as needed for severe pain.         . simvastatin (ZOCOR) 40 MG tablet   Oral   Take 40 mg by mouth at bedtime.         Marland Kitchen tiotropium (SPIRIVA) 18 MCG inhalation capsule   Inhalation   Place 18 mcg into inhaler and inhale daily.         Marland Kitchen tiZANidine (ZANAFLEX) 4 MG tablet   Oral   Take 4 mg by mouth 2 (two) times daily as needed for muscle spasms.           Allergies Review of patient's allergies indicates no known allergies.  No family history on file.  Social History History  Substance Use Topics  . Smoking status: Former Games developer  . Smokeless tobacco: Not on file  . Alcohol  Use: No    Review of Systems Constitutional: No fever/chills Eyes: No visual changes. ENT: No sore throat. Cardiovascular: See history of present illness Respiratory: See history of present illness Gastrointestinal: No abdominal pain see history of present illness  No nausea, no vomiting.  No diarrhea.  No constipation. Genitourinary: Negative for dysuria. Musculoskeletal: Negative for back pain. Skin: Negative for rash. Neurological: Negative for headaches, focal weakness or numbness.  10-point ROS otherwise negative.  ____________________________________________   PHYSICAL EXAM:  VITAL SIGNS: ED Triage Vitals  Enc Vitals Group     BP 10/27/14 1650 129/67 mmHg     Pulse Rate 10/27/14 1650 111     Resp 10/27/14 1650 26     Temp 10/27/14 1651 98.1 F (36.7 C)     Temp src --      SpO2 10/27/14 1650 89 %     Weight 10/27/14 1650 165 lb (74.844 kg)     Height 10/27/14 1650 5\' 3"  (1.6 m)     Head Cir --      Peak Flow --      Pain Score 10/27/14 1651 10     Pain Loc --      Pain Edu? --      Excl. in GC? --     Constitutional: Alert and oriented. Well appearing see history of present illness Eyes: Conjunctivae are normal. PERRL. EOMI. Head: Atraumatic. Nose: No congestion/rhinnorhea. Mouth/Throat: Mucous membranes are moist.  Oropharynx non-erythematous. Neck: No stridor.  cept for slightly Cardiovascular: Normal rate, regular rhythm. Grossly normal heart sounds.  Good peripheral circulation. Respiratory: Normal respiratory effort.  No retractions. See history of present illness Gastrointestinal: Soft and nontender except for slightly in the epigastric area . No distention. No abdominal bruits. No CVA tenderness. Musculoskeletal: No lower extremity tenderness nor edema.  No joint effusions. Neurologic:  Normal speech and language. No gross focal neurologic deficits are appreciated. No gait instability. Skin:  Skin is warm, dry and intact. No rash noted. Psychiatric:  Mood and affect are normal. Speech and behavior are normal.  ____________________________________________  LABS (all labs ordered are listed, but only abnormal results are displayed)  Labs Reviewed  CBC - Abnormal; Notable for the following:    WBC 20.2 (*)    Hemoglobin 11.0 (*)    HCT 34.8 (*)    MCH 25.5 (*)    MCHC 31.5 (*)    RDW 15.7 (*)    All other components within normal limits  COMPREHENSIVE METABOLIC PANEL - Abnormal; Notable for the following:    Chloride 99 (*)    Glucose, Bld 346 (*)    ALT 11 (*)    All other components within normal limits  BLOOD GAS, ARTERIAL - Abnormal; Notable for the following:    pO2, Arterial 66 (*)    Allens test (pass/fail) POSITIVE (*)    All other components within normal limits  BRAIN NATRIURETIC PEPTIDE  TROPONIN I   ____________________________________________  EKG  EKG read and interpreted by me shows normal-sized normal sinus rhythm at a rate of 97 normal axis no significant ST-T wave changes ____________________________________________  RADIOLOGY  X-ray read by radiologist and reviewed by me shows left lower lobe infiltrate ____________________________________________   PROCEDURES  Patient walked in room by tECH patient becomes more tachypnea, she is walking in the room and becomes tachycardic using accessory muscles and begins to get dizzy and stumble after walking around the room once or twice she did not desaturate.  ____________________________________________   INITIAL IMPRESSION / ASSESSMENT AND PLAN / ED COURSE  Pertinent labs & imaging results that were available during my care of the patient were reviewed by me and considered in my medical decision making (see chart for details).   ____________________________________________   FINAL CLINICAL IMPRESSION(S) / ED DIAGNOSES  Final diagnoses:  Community acquired pneumonia      Arnaldo Natal, MD 10/27/14 1929

## 2014-10-27 NOTE — ED Notes (Signed)
Pt reports that she has had chest tightness and sob since noon today.  No n/v/d.  Pt has raspy cough.  No fever.  md at bedside.  Iv started stat.  Breathing treatment done.  nsr on monitor.  Alert.

## 2014-10-27 NOTE — ED Notes (Signed)
Pt hx of COPD, to ER c/o SOB and burning in chest. Pt nonproductive cough, wheezing, increased WOB. Hoarse voice, color WNL.

## 2014-10-28 LAB — CBC
HCT: 33.8 % — ABNORMAL LOW (ref 35.0–47.0)
Hemoglobin: 10.9 g/dL — ABNORMAL LOW (ref 12.0–16.0)
MCH: 26 pg (ref 26.0–34.0)
MCHC: 32.4 g/dL (ref 32.0–36.0)
MCV: 80.1 fL (ref 80.0–100.0)
Platelets: 252 10*3/uL (ref 150–440)
RBC: 4.22 MIL/uL (ref 3.80–5.20)
RDW: 15.8 % — ABNORMAL HIGH (ref 11.5–14.5)
WBC: 10.3 10*3/uL (ref 3.6–11.0)

## 2014-10-28 LAB — GLUCOSE, CAPILLARY
Glucose-Capillary: 317 mg/dL — ABNORMAL HIGH (ref 65–99)
Glucose-Capillary: 336 mg/dL — ABNORMAL HIGH (ref 65–99)
Glucose-Capillary: 303 mg/dL — ABNORMAL HIGH (ref 65–99)
Glucose-Capillary: 489 mg/dL — ABNORMAL HIGH (ref 65–99)
Glucose-Capillary: 490 mg/dL — ABNORMAL HIGH (ref 65–99)

## 2014-10-28 MED ORDER — METHYLPREDNISOLONE SODIUM SUCC 125 MG IJ SOLR
60.0000 mg | INTRAMUSCULAR | Status: DC
  Administered 2014-10-28: 13:00:00 60 mg via INTRAVENOUS
  Filled 2014-10-28: qty 2

## 2014-10-28 MED ORDER — PRAMIPEXOLE DIHYDROCHLORIDE 0.125 MG PO TABS
0.1250 mg | ORAL_TABLET | Freq: Every day | ORAL | Status: DC
  Administered 2014-10-28 – 2014-10-29 (×2): 0.125 mg via ORAL
  Filled 2014-10-28 (×4): qty 1

## 2014-10-28 MED ORDER — GUAIFENESIN 100 MG/5ML PO SOLN: 200.0000 mg | ORAL | Status: DC | PRN

## 2014-10-28 MED ORDER — DEXTROSE 50 % IV SOLN: 25.0000 mL | Freq: Once | INTRAVENOUS | Status: DC

## 2014-10-28 MED ORDER — INSULIN ASPART 100 UNIT/ML ~~LOC~~ SOLN
7.0000 [IU] | Freq: Once | SUBCUTANEOUS | Status: AC
  Administered 2014-10-28: 7 [IU] via SUBCUTANEOUS
  Filled 2014-10-28: qty 7

## 2014-10-28 MED ORDER — DIPHENHYDRAMINE HCL 25 MG PO CAPS
25.0000 mg | ORAL_CAPSULE | Freq: Every evening | ORAL | Status: DC | PRN
  Administered 2014-10-28: 25 mg via ORAL
  Filled 2014-10-28: qty 1

## 2014-10-28 MED ORDER — GUAIFENESIN-CODEINE 100-10 MG/5ML PO SOLN
10.0000 mL | ORAL | Status: DC | PRN
  Administered 2014-10-28 (×2): 10 mL via ORAL
  Filled 2014-10-28 (×2): qty 10

## 2014-10-28 MED ORDER — DEXTROSE 5 % IV SOLN
1.0000 g | INTRAVENOUS | Status: DC
  Administered 2014-10-28: 17:00:00 1 g via INTRAVENOUS
  Filled 2014-10-28 (×2): qty 10

## 2014-10-28 MED ORDER — MENTHOL 3 MG MT LOZG
1.0000 | LOZENGE | OROMUCOSAL | Status: DC | PRN
  Administered 2014-10-28 – 2014-10-29 (×4): 3 mg via ORAL
  Filled 2014-10-28: qty 9

## 2014-10-28 NOTE — Progress Notes (Signed)
Inpatient Diabetes Program Recommendations  AACE/ADA: New Consensus Statement on Inpatient Glycemic Control (2013)  Target Ranges:  Prepandial:   less than 140 mg/dL      Peak postprandial:   less than 180 mg/dL (1-2 hours)      Critically ill patients:  140 - 180 mg/dL   Results for [name], [name] (MRN [id number]) as of 10/28/2014 08:57  Ref. Range 10/27/2014 22:12 10/28/2014 07:18  Glucose-Capillary Latest Ref Range: 65-99 mg/dL 782 (H) 956 (H)    Diabetes history: DM2 Outpatient Diabetes medications: Metformin 1000 mg BID, Glipizide 10 mg daily Current orders for Inpatient glycemic control: Novolog 0-15 units TID with meals, Novolog 0-5 units HS, Glipizide 10 mg daily  Inpatient Diabetes Program Recommendations Correction (SSI): Please consider increasing Novolog correction to Resistant scale. HgbA1C: Please consider ordering an A1C to evaluate glycemic control over the past 2-3 months.  Note: Patient received Solumedrol 125 mg on 10/27/14 at 17:09 which is contributing to hyperglycemia. No other steroids are currently ordered.   Thanks, Orlando Penner, RN, MSN, CCRN, CDE Diabetes Coordinator Inpatient Diabetes Program [phone] (Team Pager from 8am to 5pm) [phone] ([facility] office) [phone] [facility] office) [phone] [facility] office)

## 2014-10-28 NOTE — Progress Notes (Signed)
Patient's blood sugar is higher than scheduled dose, MD notified, ordered 7 units of short acting insulin. Patient wants something for sleep, benadryl ordered.

## 2014-10-28 NOTE — Plan of Care (Signed)
Problem: Discharge Progression Outcomes Goal: Other Discharge Outcomes/Goals Outcome: Progressing Patient c/o pain in lungs after coughing and back pain.  Relieved well with prn Percocet, also c/o anxiety x1 relieved well with prn Xanax    Patient VSS stable this shift, did c/o some SOB in afternoon relieved well after respiratory gave breathing treatment did resolve complaint.   Patient continues on IV ABX   Patient fell and home and can be slightly unsteady at times high fall protocol in place u to bathroom with standby assist. Patient educated on purse lip breathing if having shortness of breath

## 2014-10-28 NOTE — Plan of Care (Signed)
Problem: Discharge Progression Outcomes Goal: Discharge plan in place and appropriate Outcome: Progressing Individualization of Care Pt prefers to be called [name] Hx of COPD, HTN, DM, Hypercholesteremia, Stroke, and Back Pain.  Admitted for bacterial pneumonia      Goal: Other Discharge Outcomes/Goals 1.  Pain:  Patient with complaints of burning pain in lungs after coughing.  Also complaints of back pain.  Administered Percocet x 2 with partial relief.   2.  Hemodynamic:  Patient VSS stable this shift. BP 148/64 mmHg  Pulse 75  Temp(Src) 98.8 F (37.1 C) (Oral)  Resp 20  Ht  (1.575 m)  Wt 163 lb 1.6 oz (73.982 kg)  BMI 29.82 kg/m2  SpO2 98% 3.  Complications:  Patient also with complaints of anxiety.  Administration of Xanax did resolve complaint.  Patient WBC count elevated.  Antibiotic treatment in EC.   4.  Activity:  Patient steady on her feet, but has had recent fall at home.  On high fall risk.  Up to bathroom with standby assist. 5.  No discharge plan at this time.  Patient educated on deep breathing and pneumonia with handout.

## 2014-10-28 NOTE — Progress Notes (Signed)
[facility] - [place] at [facility]   PATIENT NAME: [name]    MR#:  [id number]  DATE OF BIRTH:  [birth year]  SUBJECTIVE:  Patient reports that she feels better than admission however not quite at baseline. Patient does have some wheezing.  REVIEW OF SYSTEMS:    Review of Systems  Constitutional: Negative for fever, chills and malaise/fatigue.  HENT: Negative for sore throat.   Eyes: Negative for blurred vision.  Respiratory: Positive for cough, sputum production and shortness of breath. Negative for hemoptysis and wheezing.   Cardiovascular: Negative for chest pain, palpitations and leg swelling.  Gastrointestinal: Negative for nausea, vomiting, abdominal pain, diarrhea and blood in stool.  Genitourinary: Negative for dysuria.  Musculoskeletal: Negative for back pain.  Neurological: Negative for dizziness, tingling, tremors and headaches.  Endo/Heme/Allergies: Does not bruise/bleed easily.    Tolerating Diet: Yes      DRUG ALLERGIES:  No Known Allergies  VITALS:  Blood pressure 126/58, pulse 84, temperature 97.8 F (36.6 C), temperature source Oral, resp. rate 19, height  (1.575 m), weight 73.982 kg (163 lb 1.6 oz), SpO2 100 %.  PHYSICAL EXAMINATION:   Physical Exam  Constitutional: She is oriented to person, place, and time and well-developed, well-nourished, and in no distress. No distress.  HENT:  Head: Normocephalic.  Eyes: No scleral icterus.  Neck: Normal range of motion. Neck supple. No JVD present. No tracheal deviation present.  Cardiovascular: Normal rate, regular rhythm and normal heart sounds.  Exam reveals no gallop and no friction rub.   No murmur heard. Pulmonary/Chest: Effort normal. No respiratory distress. She has wheezes. She has no rales. She exhibits no tenderness.  Abdominal: Soft. Bowel sounds are normal. She exhibits no distension and no mass. There is no tenderness. There is no rebound and no guarding.   Musculoskeletal: Normal range of motion. She exhibits no edema.  Neurological: She is alert and oriented to person, place, and time.  Skin: Skin is warm. No rash noted. No erythema.  Psychiatric: Affect and judgment normal.      LABORATORY PANEL:   CBC  Recent Labs Lab 10/28/14 0452  WBC 10.3  HGB 10.9*  HCT 33.8*  PLT 252   ------------------------------------------------------------------------------------------------------------------  Chemistries   Recent Labs Lab 10/27/14 1708  NA 138  K 4.7  CL 99*  CO2 24  GLUCOSE 346*  BUN 18  CREATININE 0.95  CALCIUM 9.3  AST 32  ALT 11*  ALKPHOS 88  BILITOT 0.3   ------------------------------------------------------------------------------------------------------------------  Cardiac Enzymes  Recent Labs Lab 10/27/14 1708  TROPONINI <0.03   ------------------------------------------------------------------------------------------------------------------  RADIOLOGY:  Dg Chest Portable 1 View  10/27/2014   CLINICAL DATA:  Dyspnea, onset yesterday.  Worsened today.  EXAM: PORTABLE CHEST - 1 VIEW  COMPARISON:  12/25/2013  FINDINGS: There is patchy ground-glass opacity in the left base adjacent to the cardiac apex. This is new. It could represent infectious infiltrate. The right lung is clear. There is no large effusion. Heart size is normal. Hilar and mediastinal contours are normal. Pulmonary vasculature is normal.  IMPRESSION: Patchy infiltrate in the left base, possibly infectious. Followup PA and lateral chest X-ray is recommended in 3-4 weeks following trial of antibiotic therapy to ensure resolution and exclude underlying malignancy.   Electronically Signed   By: Ellery Plunk M.D.   On: 10/27/2014 17:35     ASSESSMENT AND PLAN:   59-year-old female with a history of COPD, diabetes and hypertension who presented with shortness of breath  found to have a continue quite pneumonia.  1. Acute respiratory  failure : This is a combination of COPD exacerbation as well as left lower lobe pneumonia as outlined below.  2. Community acquired pneumonia: Patient continue Rocephin and azithromycin. Blood culture negative date. We'll continue with current management.  3.. Acute COPD exacerbation: Patient has wheezing today on examination. I will start low-dose IV steroids and continue her current nebulizer treatment and inhaler.   4. Diabetes without complication type II: Patient's metformin is on hold. Continue sliding scale insulin. Continue glipizide and ADA diet.  5. PSYCH: Continue Celexa and Adderall  6. Essential hypertension: Patient's blood pressure is controlled. Continue lisinopril    Management plans discussed with the patient and she is in agreement.  CODE STATUS: Full  TOTAL TIME TAKING CARE OF THIS PATIENT: 35 minutes.   Greater than 50% counseling and coordination of care  POSSIBLE D/Ctomorrow , DEPENDING ON CLINICAL CONDITION.   Alistar Mcenery M.D on 10/28/2014 at 12:23 PM  Between 7am to 6pm - Pager - [phone] After 6pm go to [email or web address] - password [email or web address] [facility]  [place] [facility]  Office  [phone]  CC: Primary care physician; ADAMS, Laurette Schimke, MD

## 2014-10-29 LAB — GLUCOSE, CAPILLARY
GLUCOSE-CAPILLARY: 205 mg/dL — AB (ref 65–99)
Glucose-Capillary: 251 mg/dL — ABNORMAL HIGH (ref 65–99)

## 2014-10-29 MED ORDER — PREDNISONE 10 MG PO TABS: 5.0000 mg | ORAL_TABLET | Freq: Every day | ORAL | Status: DC

## 2014-10-29 MED ORDER — PREDNISONE 50 MG PO TABS
50.0000 mg | ORAL_TABLET | Freq: Every day | ORAL | Status: DC
  Administered 2014-10-29: 50 mg via ORAL
  Filled 2014-10-29: qty 1

## 2014-10-29 MED ORDER — LEVOFLOXACIN 750 MG PO TABS: 750.0000 mg | ORAL_TABLET | Freq: Every day | ORAL | Status: DC

## 2014-10-29 MED ORDER — GUAIFENESIN-CODEINE 100-10 MG/5ML PO SOLN
10.0000 mL | Freq: Four times a day (QID) | ORAL | Status: DC | PRN
Start: 2014-10-29 — End: 2020-06-23

## 2014-10-29 MED ORDER — IPRATROPIUM-ALBUTEROL 0.5-2.5 (3) MG/3ML IN SOLN: 3.0000 mL | Freq: Four times a day (QID) | RESPIRATORY_TRACT | Status: DC | PRN

## 2014-10-29 MED ORDER — FLUCONAZOLE 100 MG PO TABS
150.0000 mg | ORAL_TABLET | Freq: Once | ORAL | Status: AC
  Administered 2014-10-29: 150 mg via ORAL
  Filled 2014-10-29: qty 2

## 2014-10-29 NOTE — Progress Notes (Signed)
Discharge instructions given and went over with patient at bedside. All questions answered. Prescriptions in hand. Patient discharged home via wheelchair by nursing staff. Bo Mcclintock, RN

## 2014-10-29 NOTE — Discharge Summary (Signed)
[facility] - [place] at [facility]   PATIENT NAME: [name]    MR#:  [id number]  DATE OF BIRTH:  February 26, 1956  DATE OF ADMISSION:  10/27/2014 ADMITTING PHYSICIAN: Gracelyn Nurse, MD  DATE OF DISCHARGE: 10/28/2014 PRIMARY CARE PHYSICIAN: ADAMS, Laurette Schimke, MD    ADMISSION DIAGNOSIS:  Community acquired pneumonia [J18.9]  DISCHARGE DIAGNOSIS:  Active Problems:   Pneumonia   SECONDARY DIAGNOSIS:   Past Medical History  Diagnosis Date  . COPD (chronic obstructive pulmonary disease)   . Hypertension   . Diabetes mellitus without complication   . Hypercholesteremia   . Back pain   . Stroke     HOSPITAL COURSE:  59 year old female with a history of COPD, diabetes and hypertension who presented with shortness of breath found to have a continue quite pneumonia.  1. Acute respiratory failure : This is a combination of COPD exacerbation as well as left lower lobe pneumonia as outlined below.  2. Community acquired pneumonia: Patient was started on Rocephin and azithromycin. Blood cultures are negative date. Her symptoms have improved. She will be discharged on oral antibiotics.  3.. Acute COPD exacerbation: Patient was started on low-dose IV steroids and continued her current nebulizer treatment and inhaler. On discharge she has no audible wheezing and is not requiring oxygen.   4. Diabetes without complication type II: Patient may resume her outpatient medications including metformin and glipizide. 5. PSYCH: Continue Celexa and Adderall  6. Essential hypertension: Patient's blood pressure is controlled. Continue lisinopril   DISCHARGE CONDITIONS AND DIET:  Patient's being discharged home in stable condition on a regular diet  CONSULTS OBTAINED:    none  DRUG ALLERGIES:  No Known Allergies  DISCHARGE MEDICATIONS:   Current Discharge Medication List    START taking these medications   Details  guaiFENesin-codeine 100-10 MG/5ML syrup  Take 10 mLs by mouth every 6 (six) hours as needed for cough. Qty: 120 mL, Refills: 0    !! ipratropium-albuterol (DUONEB) 0.5-2.5 (3) MG/3ML SOLN Take 3 mLs by nebulization every 6 (six) hours as needed. Qty: 360 mL, Refills: 0    levofloxacin (LEVAQUIN) 750 MG tablet Take 1 tablet (750 mg total) by mouth daily. Qty: 5 tablet, Refills: 0    predniSONE (DELTASONE) 10 MG tablet Take 0.5 tablets (5 mg total) by mouth daily with breakfast. Qty: 38 tablet, Refills: 0     !! - Potential duplicate medications found. Please discuss with provider.    CONTINUE these medications which have NOT CHANGED   Details  albuterol (PROVENTIL HFA;VENTOLIN HFA) 108 (90 BASE) MCG/ACT inhaler Inhale 2 puffs into the lungs 4 (four) times daily as needed for wheezing or shortness of breath.    ALPRAZolam (XANAX) 1 MG tablet Take 1 mg by mouth every 6 (six) hours as needed for anxiety.    amphetamine-dextroamphetamine (ADDERALL) 30 MG tablet Take 30 mg by mouth 2 (two) times daily.    citalopram (CELEXA) 20 MG tablet Take 20 mg by mouth daily.    esomeprazole (NEXIUM) 40 MG capsule Take 40 mg by mouth daily at 12 noon.    fluticasone (FLONASE) 50 MCG/ACT nasal spray Place 1-2 sprays into both nostrils 2 (two) times daily as needed for allergies or rhinitis.    fluticasone (FLOVENT HFA) 110 MCG/ACT inhaler Inhale 2 puffs into the lungs 2 (two) times daily.    !! gabapentin (NEURONTIN) 100 MG capsule Take 100 mg by mouth every morning.    !! gabapentin (NEURONTIN) 300 MG  capsule Take 900 mg by mouth at bedtime.    glipiZIDE (GLUCOTROL XL) 10 MG 24 hr tablet Take 10 mg by mouth daily.    ibuprofen (ADVIL,MOTRIN) 800 MG tablet Take 800 mg by mouth every 8 (eight) hours as needed for moderate pain.    !! ipratropium-albuterol (DUONEB) 0.5-2.5 (3) MG/3ML SOLN Take 3 mLs by nebulization 4 (four) times daily as needed (for shortness of breath).    lisdexamfetamine (VYVANSE) 70 MG capsule Take 70 mg by mouth  daily.    lisinopril (PRINIVIL,ZESTRIL) 5 MG tablet Take 5 mg by mouth daily.    metFORMIN (GLUCOPHAGE) 1000 MG tablet Take 1,000 mg by mouth 2 (two) times daily with a meal.    oxyCODONE-acetaminophen (PERCOCET) 7.5-325 MG per tablet Take 1 tablet by mouth every 4 (four) hours as needed for severe pain.    simvastatin (ZOCOR) 40 MG tablet Take 40 mg by mouth at bedtime.    tiotropium (SPIRIVA) 18 MCG inhalation capsule Place 18 mcg into inhaler and inhale daily.    tiZANidine (ZANAFLEX) 4 MG tablet Take 4 mg by mouth 2 (two) times daily as needed for muscle spasms.     !! - Potential duplicate medications found. Please discuss with provider.            Today   CHIEF COMPLAINT:  Patient is feeling much better this morning. Patient has no complaints. Patient is ready for discharge. Patient denies shortness of breath. She does have a cough however it is improved since admission.   VITAL SIGNS:  Blood pressure 108/54, pulse 71, temperature 97.6 F (36.4 C), temperature source Oral, resp. rate 24, height 5\' 2"  (1.575 m), weight 73.982 kg (163 lb 1.6 oz), SpO2 99 %.   REVIEW OF SYSTEMS:  Review of Systems  Constitutional: Negative for fever, chills and malaise/fatigue.  HENT: Negative for sore throat.   Eyes: Negative for blurred vision.  Respiratory: Positive for cough. Negative for hemoptysis, shortness of breath and wheezing.   Cardiovascular: Negative for chest pain, palpitations and leg swelling.  Gastrointestinal: Negative for nausea, vomiting, abdominal pain, diarrhea and blood in stool.  Genitourinary: Negative for dysuria.  Musculoskeletal: Negative for back pain.  Neurological: Negative for dizziness, tremors and headaches.  Endo/Heme/Allergies: Does not bruise/bleed easily.     PHYSICAL EXAMINATION:  GENERAL:  59 y.o.-year-old patient lying in the bed with no acute distress.  NECK:  Supple, no jugular venous distention. No thyroid enlargement, no tenderness.   LUNGS: Normal breath sounds bilaterally, no wheezing, rales,rhonchi  No use of accessory muscles of respiration.  CARDIOVASCULAR: S1, S2 normal. No murmurs, rubs, or gallops.  ABDOMEN: Soft, non-tender, non-distended. Bowel sounds present. No organomegaly or mass.  EXTREMITIES: No pedal edema, cyanosis, or clubbing.  PSYCHIATRIC: The patient is alert and oriented x 3.  SKIN: No obvious rash, lesion, or ulcer.   DATA REVIEW:   CBC  Recent Labs Lab 10/28/14 0452  WBC 10.3  HGB 10.9*  HCT 33.8*  PLT 252    Chemistries   Recent Labs Lab 10/27/14 1708  NA 138  K 4.7  CL 99*  CO2 24  GLUCOSE 346*  BUN 18  CREATININE 0.95  CALCIUM 9.3  AST 32  ALT 11*  ALKPHOS 88  BILITOT 0.3    Cardiac Enzymes  Recent Labs Lab 10/27/14 1708  TROPONINI <0.03    Microbiology Results  @[email or web address] @  RADIOLOGY:  Dg Chest Portable 1 View  10/27/2014   CLINICAL DATA:  Dyspnea, onset yesterday.  Worsened today.  EXAM: PORTABLE CHEST - 1 VIEW  COMPARISON:  12/25/2013  FINDINGS: There is patchy ground-glass opacity in the left base adjacent to the cardiac apex. This is new. It could represent infectious infiltrate. The right lung is clear. There is no large effusion. Heart size is normal. Hilar and mediastinal contours are normal. Pulmonary vasculature is normal.  IMPRESSION: Patchy infiltrate in the left base, possibly infectious. Followup PA and lateral chest X-ray is recommended in 3-4 weeks following trial of antibiotic therapy to ensure resolution and exclude underlying malignancy.   Electronically Signed   By: Ellery Plunk M.D.   On: 10/27/2014 17:35      Management plans discussed with the patient and she is in agreement. Stable for discharge home  Patient should follow up with PCP  CODE STATUS:     Code Status Orders        Start     Ordered   10/27/14 2140  Full code   Continuous     10/27/14 2139      TOTAL TIME TAKING CARE OF THIS PATIENT: 35 minutes.     Caleb Prigmore M.D on 10/29/2014 at 11:20 AM  Between 7am to 6pm - Pager - [phone] After 6pm go to [email or web address] - password [email or web address] [facility]  [place] [facility]  Office  [phone]  CC: Primary care physician; ADAMS, Laurette Schimke, MD

## 2014-11-01 LAB — CULTURE, BLOOD (ROUTINE X 2)
Culture: NO GROWTH
Culture: NO GROWTH

## 2014-12-01 ENCOUNTER — Ambulatory Visit: Payer: Self-pay | Admitting: Family Medicine

## 2015-08-11 ENCOUNTER — Emergency Department: Payer: Medicare Other

## 2015-08-11 ENCOUNTER — Emergency Department
Admission: EM | Admit: 2015-08-11 | Discharge: 2015-08-11 | Disposition: A | Discharge status: Home / Self Care | Payer: Medicare Other | Attending: Emergency Medicine | Admitting: Emergency Medicine

## 2015-08-11 ENCOUNTER — Encounter: Payer: Self-pay | Admitting: Emergency Medicine

## 2015-08-11 DIAGNOSIS — Z87891 Personal history of nicotine dependence: Secondary | ICD-10-CM | POA: Insufficient documentation

## 2015-08-11 DIAGNOSIS — Z7984 Long term (current) use of oral hypoglycemic drugs: Secondary | ICD-10-CM | POA: Insufficient documentation

## 2015-08-11 DIAGNOSIS — E119 Type 2 diabetes mellitus without complications: Secondary | ICD-10-CM | POA: Insufficient documentation

## 2015-08-11 DIAGNOSIS — Z79899 Other long term (current) drug therapy: Secondary | ICD-10-CM | POA: Insufficient documentation

## 2015-08-11 DIAGNOSIS — T7840XA Allergy, unspecified, initial encounter: Secondary | ICD-10-CM | POA: Diagnosis present

## 2015-08-11 DIAGNOSIS — I10 Essential (primary) hypertension: Secondary | ICD-10-CM | POA: Insufficient documentation

## 2015-08-11 DIAGNOSIS — E7800 Pure hypercholesterolemia, unspecified: Secondary | ICD-10-CM | POA: Insufficient documentation

## 2015-08-11 DIAGNOSIS — J449 Chronic obstructive pulmonary disease, unspecified: Secondary | ICD-10-CM | POA: Diagnosis not present

## 2015-08-11 DIAGNOSIS — Z792 Long term (current) use of antibiotics: Secondary | ICD-10-CM | POA: Diagnosis not present

## 2015-08-11 DIAGNOSIS — Z8673 Personal history of transient ischemic attack (TIA), and cerebral infarction without residual deficits: Secondary | ICD-10-CM | POA: Insufficient documentation

## 2015-08-11 DIAGNOSIS — R42 Dizziness and giddiness: Secondary | ICD-10-CM | POA: Diagnosis not present

## 2015-08-11 LAB — BASIC METABOLIC PANEL
Anion gap: 10 (ref 5–15)
BUN: 14 mg/dL (ref 6–20)
CALCIUM: 9.8 mg/dL (ref 8.9–10.3)
CO2: 27 mmol/L (ref 22–32)
CREATININE: 0.85 mg/dL (ref 0.44–1.00)
Chloride: 101 mmol/L (ref 101–111)
GFR calc Af Amer: >60 mL/min (ref >60)
Glucose, Bld: 209 mg/dL — ABNORMAL HIGH (ref 65–99)
POTASSIUM: 4.7 mmol/L (ref 3.5–5.1)
SODIUM: 138 mmol/L (ref 135–145)

## 2015-08-11 LAB — URINALYSIS COMPLETE WITH MICROSCOPIC (ARMC ONLY)
BILIRUBIN URINE: NEGATIVE
GLUCOSE, UA: NEGATIVE mg/dL
HGB URINE DIPSTICK: NEGATIVE
Ketones, ur: NEGATIVE mg/dL
LEUKOCYTES UA: NEGATIVE
NITRITE: NEGATIVE
Protein, ur: NEGATIVE mg/dL
SPECIFIC GRAVITY, URINE: 1.008 (ref 1.005–1.030)
pH: 5 (ref 5.0–8.0)

## 2015-08-11 LAB — GLUCOSE, CAPILLARY: GLUCOSE-CAPILLARY: 185 mg/dL — AB (ref 65–99)

## 2015-08-11 LAB — CBC
HEMATOCRIT: 38.1 % (ref 35.0–47.0)
Hemoglobin: 12.2 g/dL (ref 12.0–16.0)
MCH: 26.1 pg (ref 26.0–34.0)
MCHC: 32 g/dL (ref 32.0–36.0)
MCV: 81.6 fL (ref 80.0–100.0)
PLATELETS: 237 10*3/uL (ref 150–440)
RBC: 4.67 MIL/uL (ref 3.80–5.20)
RDW: 14.9 % — AB (ref 11.5–14.5)
WBC: 10.2 10*3/uL (ref 3.6–11.0)

## 2015-08-11 MED ORDER — IPRATROPIUM-ALBUTEROL 0.5-2.5 (3) MG/3ML IN SOLN
3.0000 mL | Freq: Once | RESPIRATORY_TRACT | Status: AC
  Administered 2015-08-11: 3 mL via RESPIRATORY_TRACT
  Filled 2015-08-11 (×2): qty 3

## 2015-08-11 MED ORDER — OXYCODONE-ACETAMINOPHEN 5-325 MG PO TABS
1.0000 | ORAL_TABLET | Freq: Once | ORAL | Status: AC
  Administered 2015-08-11: 1 via ORAL
  Filled 2015-08-11: qty 1

## 2015-08-11 MED ORDER — PREDNISONE 20 MG PO TABS: 40.0000 mg | ORAL_TABLET | Freq: Every day | ORAL | Status: DC

## 2015-08-11 MED ORDER — RANITIDINE HCL 150 MG/10ML PO SYRP
150.0000 mg | ORAL_SOLUTION | Freq: Once | ORAL | Status: AC
  Administered 2015-08-11: 150 mg via ORAL
  Filled 2015-08-11: qty 10

## 2015-08-11 MED ORDER — DIPHENHYDRAMINE HCL 25 MG PO CAPS
50.0000 mg | ORAL_CAPSULE | Freq: Once | ORAL | Status: AC
  Administered 2015-08-11: 50 mg via ORAL
  Filled 2015-08-11: qty 2

## 2015-08-11 MED ORDER — BENZONATATE 100 MG PO CAPS: 100.0000 mg | ORAL_CAPSULE | Freq: Four times a day (QID) | ORAL | Status: AC | PRN

## 2015-08-11 MED ORDER — METHYLPREDNISOLONE SODIUM SUCC 125 MG IJ SOLR
125.0000 mg | Freq: Once | INTRAMUSCULAR | Status: AC
  Administered 2015-08-11: 125 mg via INTRAVENOUS
  Filled 2015-08-11: qty 2

## 2015-08-11 NOTE — ED Provider Notes (Signed)
[facility] Emergency Department Provider Note    ____________________________________________  Time seen: ~1535  I have reviewed the triage vital signs and the nursing notes.   HISTORY  Chief Complaint Allergic Reaction and Dizziness   History limited by: Not Limited   HPI [name] is a 60 y.o. female who presents to the emergency department today with primary complaint of facial swelling. The patient states that she got bit between her eyebrows yesterday by a bug when she was outside. She states that since that time she started noticing swelling around her eyes. The patient tried taking some Benadryl last night. She states today the swelling has progressively gotten worse. It is associated with some itchiness. She denies any blurry vision or double vision. The patient states that she has also developed a cough today. The patient does have a history of COPD. It is nonproductive. The patient has not had any chest pain. The patient does not feel like her throat or tongue or swelling. Patient denies any recent fevers.   Past Medical History  Diagnosis Date  . COPD (chronic obstructive pulmonary disease) (HCC)   . Hypertension   . Diabetes mellitus without complication (HCC)   . Hypercholesteremia   . Back pain   . Stroke [facility])     Patient Active Problem List   Diagnosis Date Noted  . Pneumonia 10/27/2014    Past Surgical History  Procedure Laterality Date  . Cholecystectomy      Current Outpatient Rx  Name  Route  Sig  Dispense  Refill  . albuterol (PROVENTIL HFA;VENTOLIN HFA) 108 (90 BASE) MCG/ACT inhaler   Inhalation   Inhale 2 puffs into the lungs 4 (four) times daily as needed for wheezing or shortness of breath.         . ALPRAZolam (XANAX) 1 MG tablet   Oral   Take 1 mg by mouth every 6 (six) hours as needed for anxiety.         Marland Kitchen amphetamine-dextroamphetamine (ADDERALL) 30 MG tablet   Oral   Take 30 mg by mouth 2 (two)  times daily.         . citalopram (CELEXA) 20 MG tablet   Oral   Take 20 mg by mouth daily.         Marland Kitchen esomeprazole (NEXIUM) 40 MG capsule   Oral   Take 40 mg by mouth daily at 12 noon.         . fluticasone (FLONASE) 50 MCG/ACT nasal spray   Each Nare   Place 1-2 sprays into both nostrils 2 (two) times daily as needed for allergies or rhinitis.         . fluticasone (FLOVENT HFA) 110 MCG/ACT inhaler   Inhalation   Inhale 2 puffs into the lungs 2 (two) times daily.         Marland Kitchen gabapentin (NEURONTIN) 100 MG capsule   Oral   Take 100 mg by mouth every morning.         . gabapentin (NEURONTIN) 300 MG capsule   Oral   Take 900 mg by mouth at bedtime.         Marland Kitchen glipiZIDE (GLUCOTROL XL) 10 MG 24 hr tablet   Oral   Take 10 mg by mouth daily.         Marland Kitchen guaiFENesin-codeine 100-10 MG/5ML syrup   Oral   Take 10 mLs by mouth every 6 (six) hours as needed for cough.   120 mL  0   . ibuprofen (ADVIL,MOTRIN) 800 MG tablet   Oral   Take 800 mg by mouth every 8 (eight) hours as needed for moderate pain.         Marland Kitchen. ipratropium-albuterol (DUONEB) 0.5-2.5 (3) MG/3ML SOLN   Nebulization   Take 3 mLs by nebulization 4 (four) times daily as needed (for shortness of breath).         Marland Kitchen. ipratropium-albuterol (DUONEB) 0.5-2.5 (3) MG/3ML SOLN   Nebulization   Take 3 mLs by nebulization every 6 (six) hours as needed.   360 mL   0   . levofloxacin (LEVAQUIN) 750 MG tablet   Oral   Take 1 tablet (750 mg total) by mouth daily.   5 tablet   0   . lisdexamfetamine (VYVANSE) 70 MG capsule   Oral   Take 70 mg by mouth daily.         Marland Kitchen. lisinopril (PRINIVIL,ZESTRIL) 5 MG tablet   Oral   Take 5 mg by mouth daily.         . metFORMIN (GLUCOPHAGE) 1000 MG tablet   Oral   Take 1,000 mg by mouth 2 (two) times daily with a meal.         . oxyCODONE-acetaminophen (PERCOCET) 7.5-325 MG per tablet   Oral   Take 1 tablet by mouth every 4 (four) hours as needed for severe  pain.         . predniSONE (DELTASONE) 10 MG tablet   Oral   Take 0.5 tablets (5 mg total) by mouth daily with breakfast.   38 tablet   0     Label  & dispense according to the schedule below: ...   . simvastatin (ZOCOR) 40 MG tablet   Oral   Take 40 mg by mouth at bedtime.         Marland Kitchen. tiotropium (SPIRIVA) 18 MCG inhalation capsule   Inhalation   Place 18 mcg into inhaler and inhale daily.         Marland Kitchen. tiZANidine (ZANAFLEX) 4 MG tablet   Oral   Take 4 mg by mouth 2 (two) times daily as needed for muscle spasms.           Allergies Review of patient's allergies indicates no known allergies.  History reviewed. No pertinent family history.  Social History Social History  Substance Use Topics  . Smoking status: Former Games developermoker  . Smokeless tobacco: None  . Alcohol Use: No    Review of Systems  Constitutional: Negative for fever. Cardiovascular: Negative for chest pain. Respiratory: Negative for shortness of breath.Positive for cough Gastrointestinal: Negative for abdominal pain, vomiting and diarrhea. Genitourinary: Negative for dysuria. Musculoskeletal: Positive for chronic back pain Skin: Negative for rash. Positive for swelling around her eyes Neurological: Negative for headaches, focal weakness or numbness.  10-point ROS otherwise negative.  ____________________________________________   PHYSICAL EXAM:  VITAL SIGNS: ED Triage Vitals  Enc Vitals Group     BP 08/11/15 1320 143/61 mmHg     Pulse Rate 08/11/15 1320 80     Resp 08/11/15 1320 18     Temp 08/11/15 1320 98.2 F (36.8 C)     Temp Source 08/11/15 1320 Oral     SpO2 08/11/15 1320 99 %     Weight 08/11/15 1320 165 lb (74.844 kg)     Height 08/11/15 1320 5\' 2"  (1.575 m)     Head Cir --      Peak Flow --  Pain Score 08/11/15 1320 10   Constitutional: Alert and oriented. Well appearing and in no distress. Eyes: Conjunctivae are normal. PERRL. Normal extraocular movements. ENT   Head:  Normocephalic and atraumatic.Patient does have some swelling around bilateral eyes. No conjunctival injection.   Nose: No congestion/rhinnorhea.   Mouth/Throat: Mucous membranes are moist. No tongue swelling. No pharyngeal swelling or erythema.   Neck: No stridor. Hematological/Lymphatic/Immunilogical: No cervical lymphadenopathy. Cardiovascular: Normal rate, regular rhythm.  No murmurs, rubs, or gallops. Respiratory: Normal respiratory effort without tachypnea nor retractions.  Diffuse bilateral expiratory wheezing. Occasional dry cough. Gastrointestinal: Soft and nontender. No distention.  Genitourinary: Deferred Musculoskeletal: Normal range of motion in all extremities. No joint effusions.  No lower extremity tenderness nor edema. Neurologic:  Normal speech and language. No gross focal neurologic deficits are appreciated.  Skin:  Skin is warm, dry and intact. No rash noted. Psychiatric: Mood and affect are normal. Speech and behavior are normal. Patient exhibits appropriate insight and judgment.  ____________________________________________    LABS (pertinent positives/negatives)  Labs Reviewed  GLUCOSE, CAPILLARY - Abnormal; Notable for the following:    Glucose-Capillary 185 (*)    All other components within normal limits  BASIC METABOLIC PANEL - Abnormal; Notable for the following:    Glucose, Bld 209 (*)    All other components within normal limits  CBC - Abnormal; Notable for the following:    RDW 14.9 (*)    All other components within normal limits  URINALYSIS COMPLETEWITH MICROSCOPIC ([facility] ONLY) - Abnormal; Notable for the following:    Color, Urine STRAW (*)    APPearance CLEAR (*)    Bacteria, UA RARE (*)    Squamous Epithelial / LPF 0-5 (*)    All other components within normal limits  CBG MONITORING, ED     ____________________________________________   EKG  I, Phineas Semen, attending physician, personally viewed and interpreted this  EKG  EKG Time: 1333 Rate: 77 Rhythm: normal sinus rhythm with av dual paced complex Axis: normal Intervals: qtc 463 QRS: narrow ST changes: no st elevation Impression: abnormal ekg ____________________________________________    RADIOLOGY  CXR IMPRESSION: No active cardiopulmonary disease.  ____________________________________________   PROCEDURES  Procedure(s) performed: None  Critical Care performed: No  ____________________________________________   INITIAL IMPRESSION / ASSESSMENT AND PLAN / ED COURSE  Pertinent labs & imaging results that were available during my care of the patient were reviewed by me and considered in my medical decision making (see chart for details).  Patient presents to the emergency department today primarily because of concerns for swelling around her eyes which she thinks is secondary to an insect bite. On exam patient does have some. Orbital swelling. No erythema or warmth. Motions are intact. This point I think likely secondary to some localized edema possibly secondary to insect bite. Will give steroids as well as oral Benadryl and Zantac. This point no concerning signs for anaphylaxis. No throat swelling. No nausea or vomiting. Blood pressure is good. Patient has had a cough and some wheezing, while this theoretically could be secondary to allergic reaction to think more likely it represents a COPD exacerbation given patient's history. I think the IV steroids will help with this. Additionally will try DuoNeb. Will furthermore get a chest x-ray to make sure no pneumonia is present.  ----------------------------------------- 5:29 PM on 08/11/2015 -----------------------------------------  Patient appears much improved after medication. Swelling around the eyes has decreased significantly. Patient states she felt a little bit better after DuoNeb treatment. Coughing has decreased  in frequency. Will plan on discharging home with further prednisone  and cough medication.  ____________________________________________   FINAL CLINICAL IMPRESSION(S) / ED DIAGNOSES  Final diagnoses:  Allergic reaction, initial encounter     Phineas Semen, MD 08/11/15 1730

## 2015-08-11 NOTE — ED Notes (Addendum)
Yesterday pt claims was bitten in her yard but not sure by what. Ambulance came to home by pt did not want to be taken to ED, pt took benadryl. Face was swollen today and caused the pt alarm and decided to come in to be seen.

## 2015-08-11 NOTE — Discharge Instructions (Signed)
Please seek medical attention for any high fevers, chest pain, shortness of breath, change in behavior, persistent vomiting, bloody stool or any other new or concerning symptoms.   Allergies An allergy is when your body reacts to a substance in a way that is not normal. An allergic reaction can happen after you:  Eat something.  Breathe in something.  Touch something. WHAT KINDS OF ALLERGIES ARE THERE? You can be allergic to:  Things that are only around during certain seasons, like molds and pollens.  Foods.  Drugs.  Insects.  Animal dander. WHAT ARE SYMPTOMS OF ALLERGIES?  Puffiness (swelling). This may happen on the lips, face, tongue, mouth, or throat.  Sneezing.  Coughing.  Breathing loudly (wheezing).  Stuffy nose.  Tingling in the mouth.  A rash.  Itching.  Itchy, red, puffy areas of skin (hives).  Watery eyes.  Throwing up (vomiting).  Watery poop (diarrhea).  Dizziness.  Feeling faint or fainting.  Trouble breathing or swallowing.  A tight feeling in the chest.  A fast heartbeat. HOW ARE ALLERGIES DIAGNOSED? Allergies can be diagnosed with:  A medical and family history.  Skin tests.  Blood tests.  A food diary. A food diary is a record of all the foods, drinks, and symptoms you have each day.  The results of an elimination diet. This diet involves making sure not to eat certain foods and then seeing what happens when you start eating them again. HOW ARE ALLERGIES TREATED? There is no cure for allergies, but allergic reactions can be treated with medicine. Severe reactions usually need to be treated at a hospital.  HOW CAN REACTIONS BE PREVENTED? The best way to prevent an allergic reaction is to avoid the thing you are allergic to. Allergy shots and medicines can also help prevent reactions in some cases.   This information is not intended to replace advice given to you by your health care provider. Make sure you discuss any  questions you have with your health care provider.   Document Released: 07/13/2012 Document Revised: 04/08/2014 Document Reviewed: 12/28/2013 Elsevier Interactive Patient Education 2016 Elsevier Inc.  Cough, Adult A cough helps to clear your throat and lungs. A cough may last only 2-3 weeks (acute), or it may last longer than 8 weeks (chronic). Many different things can cause a cough. A cough may be a sign of an illness or another medical condition. HOME CARE  Pay attention to any changes in your cough.  Take medicines only as told by your doctor.  If you were prescribed an antibiotic medicine, take it as told by your doctor. Do not stop taking it even if you start to feel better.  Talk with your doctor before you try using a cough medicine.  Drink enough fluid to keep your pee (urine) clear or pale yellow.  If the air is dry, use a cold steam vaporizer or humidifier in your home.  Stay away from things that make you cough at work or at home.  If your cough is worse at night, try using extra pillows to raise your head up higher while you sleep.  Do not smoke, and try not to be around smoke. If you need help quitting, ask your doctor.  Do not have caffeine.  Do not drink alcohol.  Rest as needed. GET HELP IF:  You have new problems (symptoms).  You cough up yellow fluid (pus).  Your cough does not get better after 2-3 weeks, or your cough gets worse.  Medicine  does not help your cough and you are not sleeping well.  You have pain that gets worse or pain that is not helped with medicine.  You have a fever.  You are losing weight and you do not know why.  You have night sweats. GET HELP RIGHT AWAY IF:  You cough up blood.  You have trouble breathing.  Your heartbeat is very fast.   This information is not intended to replace advice given to you by your health care provider. Make sure you discuss any questions you have with your health care provider.   Document  Released: 11/29/2010 Document Revised: 12/07/2014 Document Reviewed: 05/25/2014 Elsevier Interactive Patient Education [employer] Elsevier Inc.

## 2015-08-11 NOTE — ED Notes (Addendum)
Patient Daughter in law Kayla SkatesHeather [name] Contact information  [phone]

## 2015-08-11 NOTE — ED Notes (Signed)
Patient states that she was working out in the yard yesterday and was bit by something on her face. Patient has swelling and redness on her cheeks, eyes, nose, and forehead, breathing is equal and unlabored, patient does not appear to be in any distress at this time.   Patient also states that she has been experiencing some dizziness for the past few day and tingling in her left arm for the past several weeks.

## 2015-08-11 NOTE — ED Notes (Signed)
Redness and swelling in patients face has improved with medications given in ED

## 2016-07-04 ENCOUNTER — Emergency Department: Payer: Medicare Other

## 2016-07-04 ENCOUNTER — Emergency Department
Admission: EM | Admit: 2016-07-04 | Discharge: 2016-07-05 | Disposition: A | Discharge status: Home / Self Care | Payer: Medicare Other | Attending: Emergency Medicine | Admitting: Emergency Medicine

## 2016-07-04 ENCOUNTER — Encounter: Payer: Self-pay | Admitting: Emergency Medicine

## 2016-07-04 DIAGNOSIS — Z79899 Other long term (current) drug therapy: Secondary | ICD-10-CM | POA: Diagnosis not present

## 2016-07-04 DIAGNOSIS — E119 Type 2 diabetes mellitus without complications: Secondary | ICD-10-CM | POA: Insufficient documentation

## 2016-07-04 DIAGNOSIS — Z87891 Personal history of nicotine dependence: Secondary | ICD-10-CM | POA: Insufficient documentation

## 2016-07-04 DIAGNOSIS — J069 Acute upper respiratory infection, unspecified: Secondary | ICD-10-CM

## 2016-07-04 DIAGNOSIS — R05 Cough: Secondary | ICD-10-CM | POA: Diagnosis present

## 2016-07-04 DIAGNOSIS — J441 Chronic obstructive pulmonary disease with (acute) exacerbation: Secondary | ICD-10-CM

## 2016-07-04 DIAGNOSIS — I10 Essential (primary) hypertension: Secondary | ICD-10-CM | POA: Insufficient documentation

## 2016-07-04 DIAGNOSIS — Z7984 Long term (current) use of oral hypoglycemic drugs: Secondary | ICD-10-CM | POA: Insufficient documentation

## 2016-07-04 LAB — CBC
HCT: 36.8 % (ref 35.0–47.0)
Hemoglobin: 12 g/dL (ref 12.0–16.0)
MCH: 25.5 pg — ABNORMAL LOW (ref 26.0–34.0)
MCHC: 32.6 g/dL (ref 32.0–36.0)
MCV: 78 fL — ABNORMAL LOW (ref 80.0–100.0)
PLATELETS: 236 10*3/uL (ref 150–440)
RBC: 4.71 MIL/uL (ref 3.80–5.20)
RDW: 14.4 % (ref 11.5–14.5)
WBC: 12.7 10*3/uL — ABNORMAL HIGH (ref 3.6–11.0)

## 2016-07-04 LAB — BASIC METABOLIC PANEL
Anion gap: 8 (ref 5–15)
BUN: 15 mg/dL (ref 6–20)
CO2: 29 mmol/L (ref 22–32)
Calcium: 9.5 mg/dL (ref 8.9–10.3)
Chloride: 101 mmol/L (ref 101–111)
Creatinine, Ser: 0.91 mg/dL (ref 0.44–1.00)
Glucose, Bld: 156 mg/dL — ABNORMAL HIGH (ref 65–99)
Potassium: 3.9 mmol/L (ref 3.5–5.1)
Sodium: 138 mmol/L (ref 135–145)

## 2016-07-04 LAB — INFLUENZA PANEL BY PCR (TYPE A & B)
INFLBPCR: NEGATIVE
Influenza A By PCR: NEGATIVE

## 2016-07-04 MED ORDER — METHYLPREDNISOLONE SODIUM SUCC 125 MG IJ SOLR
125.0000 mg | Freq: Once | INTRAMUSCULAR | Status: AC
  Administered 2016-07-04: 125 mg via INTRAVENOUS
  Filled 2016-07-04: qty 2

## 2016-07-04 MED ORDER — AZITHROMYCIN 500 MG PO TABS
500.0000 mg | ORAL_TABLET | Freq: Once | ORAL | Status: AC
  Administered 2016-07-05: 500 mg via ORAL
  Filled 2016-07-04: qty 1

## 2016-07-04 MED ORDER — AZITHROMYCIN 250 MG PO TABS: 500.0000 mg | ORAL_TABLET | Freq: Every day | ORAL | 0 refills | Status: AC

## 2016-07-04 MED ORDER — IPRATROPIUM-ALBUTEROL 0.5-2.5 (3) MG/3ML IN SOLN
RESPIRATORY_TRACT | Status: AC
  Administered 2016-07-04: 3 mL
  Filled 2016-07-04: qty 3

## 2016-07-04 MED ORDER — PREDNISONE 20 MG PO TABS: 60.0000 mg | ORAL_TABLET | Freq: Every day | ORAL | 0 refills | Status: DC

## 2016-07-04 MED ORDER — IPRATROPIUM-ALBUTEROL 0.5-2.5 (3) MG/3ML IN SOLN
3.0000 mL | Freq: Once | RESPIRATORY_TRACT | Status: AC
  Administered 2016-07-04: 3 mL via RESPIRATORY_TRACT

## 2016-07-04 NOTE — ED Notes (Signed)
Patient transported to X-ray 

## 2016-07-04 NOTE — ED Provider Notes (Signed)
[facility] Emergency Department Provider Note  ____________________________________________  Time seen: Approximately 9:50 PM  I have reviewed the triage vital signs and the nursing notes.   HISTORY  Chief Complaint Cough and Shortness of Breath    HPI [name] is a 61 y.o. female with a history of COPD who wears oxygen as needed, remote smoking history,HTN, HL, DM presenting with a cough. The patient reports that since yesterday, she has had a cough that is mostly dry but intermittently productive of clear mucus. She has also had a clear rhinorrhea. No ear pain, sore throat, nausea vomiting or diarrhea. No fevers or chills. At home, she has tried her albuterol inhaler without improvement.   Past Medical History:  Diagnosis Date  . Back pain   . COPD (chronic obstructive pulmonary disease) (HCC)   . Diabetes mellitus without complication (HCC)   . Hypercholesteremia   . Hypertension   . Stroke [facility])     Patient Active Problem List   Diagnosis Date Noted  . Pneumonia 10/27/2014    Past Surgical History:  Procedure Laterality Date  . CHOLECYSTECTOMY      Current Outpatient Rx  . Order #: [id number] Class: Historical Med  . Order #: [id number] Class: Historical Med  . Order #: [id number] Class: Historical Med  . Order #: [id number] Class: Print  . Order #: [id number] Class: Print  . Order #: [id number] Class: Historical Med  . Order #: [id number] Class: Historical Med  . Order #: [id number] Class: Historical Med  . Order #: [id number] Class: Historical Med  . Order #: [id number] Class: Historical Med  . Order #: [id number] Class: Historical Med  . Order #: [id number] Class: Historical Med  . Order #: [id number] Class: Print  . Order #: [id number] Class: Historical Med  . Order #: [id number] Class: Historical Med  . Order #: [id number] Class: Normal  . Order #: [id number] Class: Normal  . Order #: [id number] Class: Historical Med  . Order #: [id number] Class:  Historical Med  . Order #: [id number] Class: Historical Med  . Order #: [id number] Class: Historical Med  . Order #: [id number] Class: Print  . Order #: [id number] Class: Historical Med  . Order #: [id number] Class: Historical Med  . Order #: [id number] Class: Historical Med    Allergies Patient has no known allergies.  No family history on file.  Social History Social History  Substance Use Topics  . Smoking status: Former Games developer  . Smokeless tobacco: Never Used  . Alcohol use No    Review of Systems Constitutional: No fever/chills.No lightheadedness or syncope. No myalgias. Eyes: No visual changes. No eye discharge. ENT: No sore throat. Positive clear rhinorrhea. Cardiovascular: Denies chest pain. Denies palpitations. Respiratory: Denies shortness of breath.  Positive cough. Denies posttussive vomiting. Gastrointestinal: No abdominal pain.  No nausea, no vomiting.  No diarrhea.  No constipation. Genitourinary: Negative for dysuria. Musculoskeletal: Negative for back pain. Skin: Negative for rash. Neurological: Negative for headaches. No focal numbness, tingling or weakness.   10-point ROS otherwise negative.  ____________________________________________   PHYSICAL EXAM:  VITAL SIGNS: ED Triage Vitals  Enc Vitals Group     BP 07/04/16 2116 (!) 162/100     Pulse Rate 07/04/16 2116 98     Resp 07/04/16 2116 (!) 22     Temp 07/04/16 2116 99.7 F (37.6 C)     Temp Source 07/04/16 2116 Oral     SpO2 07/04/16 2116 100 %     Weight 07/04/16 2116 125 lb (56.7 kg)     Height 07/04/16 2116  (1.549 m)     Head  Circumference --      Peak Flow --      Pain Score 07/04/16 2134 9     Pain Loc --      Pain Edu? --      Excl. in GC? --     Constitutional: Alert and oriented. Chronically ill appearing and in no acute distress. Answers questions appropriately. Eyes: Conjunctivae are normal.  EOMI. No scleral icterus. No eye discharge. Head: Atraumatic. Nose: No  congestion/rhinnorhea. Mouth/Throat: Mucous membranes are moist.  Neck: No stridor.  Supple.  No JVD. Cardiovascular: Normal rate, regular rhythm. No murmurs, rubs or gallops.  Respiratory: Normal respiratory effort.  No accessory muscle use or retractions. Mild end expiratory wheezing in the upper lung fields. No rales or rhonchi. Prolonged expiratory phase. Is able to speak in full sentences without difficulty. Active "barky" cough during my examination that is dry.  Gastrointestinal: Soft, nontender and nondistended.  No guarding or rebound.  No peritoneal signs. Musculoskeletal: No LE edema. No ttp in the calves or palpable cords.  Negative Homan's sign. Neurologic:  A&Ox3.  Speech is clear.  Face and smile are symmetric.  EOMI.  Moves all extremities well. Skin:  Skin is warm, dry and intact. No rash noted. Psychiatric: Mood and affect are normal. Speech and behavior are normal.  Normal judgement.  ____________________________________________   LABS (all labs ordered are listed, but only abnormal results are displayed)  Labs Reviewed  CBC - Abnormal; Notable for the following:       Result Value   WBC 12.7 (*)    MCV 78.0 (*)    MCH 25.5 (*)    All other components within normal limits  BASIC METABOLIC PANEL - Abnormal; Notable for the following:    Glucose, Bld 156 (*)    All other components within normal limits  INFLUENZA PANEL BY PCR (TYPE A & B)   ____________________________________________  EKG  ED ECG REPORT I, Rockne Menghini, the attending physician, personally viewed and interpreted this ECG.   Date: 07/04/2016  EKG Time: 2124  Rate: 94  Rhythm: normal sinus rhythm  Axis: leftward  Intervals:none  ST&T Chang No STEMI ____________________________________________  RADIOLOGY  Dg Chest 2 View  Result Date: 07/04/2016 CLINICAL DATA:  Barking cough since yesterday EXAM: CHEST  2 VIEW COMPARISON:  08/11/2015 FINDINGS: The lungs are clear. The pulmonary  vasculature is normal. Heart size is normal. Hilar and mediastinal contours are unremarkable. There is no pleural effusion. IMPRESSION: No active cardiopulmonary disease. Electronically Signed   By: Ellery Plunk M.D.   On: 07/04/2016 22:08    ____________________________________________   PROCEDURES  Procedure(s) performed: None  Procedures  Critical Care performed: No ____________________________________________   INITIAL IMPRESSION / ASSESSMENT AND PLAN / ED COURSE  Pertinent labs & imaging results that were available during my care of the patient were reviewed by me and considered in my medical decision making (see chart for details).  61 y.o. female with a history of COPD presenting with cough, rhinorrhea. Overall, the patient's examination is reassuring. She is afebrile here with a normal heart rate and an O2 sat between 96 and 98% on room air on my examination. She does have some active coughing or get a chest x-ray to rule out pneumonia but most likely this is a viral URI with acute COPD exacerbation. I will treat her with Solu-Medrol, DuoNeb, and reevaluate the patient for final disposition. Pertussis, pneumothorax, or acute cardiac pathology is much less likely.  ----------------------------------------- 11:56 PM on  07/04/2016 -----------------------------------------  The patient has no evidence of infiltrate on her chest x-ray. In addition, her influenza testing is negative. She has been ambulatory with a pulse oximeter reading of 98 100% during the entirety of walking. She is feeling better after DuoNeb and has received antibiotics and steroids here. At this time, the patient is stable for discharge. Return precautions as well as follow-up instructions were discussed.  ____________________________________________  FINAL CLINICAL IMPRESSION(S) / ED DIAGNOSES  Final diagnoses:  COPD exacerbation (HCC)  Upper respiratory tract infection, unspecified type          NEW MEDICATIONS STARTED DURING THIS VISIT:  New Prescriptions   AZITHROMYCIN (ZITHROMAX) 250 MG TABLET    Take 2 tablets (500 mg total) by mouth daily. Take 1 tablet daily for 3 days.   PREDNISONE (DELTASONE) 20 MG TABLET    Take 3 tablets (60 mg total) by mouth daily.      Rockne Menghini, MD 07/04/16 2356

## 2016-07-04 NOTE — ED Notes (Signed)
Pt has barking cough since yesterday.  Pt also reports pain in neck and left arm pain.  No chest pain.  Fever today. Pt alert.  Speech clear.

## 2016-07-04 NOTE — ED Triage Notes (Addendum)
Pt to triage via w/c, frequent barky cough noted; pt st cough, SHOB, neck pain since yesterday; pt taken to room 12 and placed in hosp gown & on card monitor for further evaluation

## 2016-07-04 NOTE — Discharge Instructions (Signed)
Please drink plenty of fluids stay well hydrated. Take the entire course of antibiotics and steroids, even if you're feeling better. Continue to use your inhalers for coughing spasms, wheezing or shortness of breath.  Return to the emergency department if you develop severe pain, shortness of breath, lightheadedness or fainting, fever, or any other symptoms concerning to you.

## 2016-07-04 NOTE — ED Notes (Signed)
Report off to teresa rn.

## 2016-07-04 NOTE — ED Notes (Signed)
Pt ambulated approx. 200 feet and maintained O2 sat between 98-100%

## 2016-07-05 DIAGNOSIS — J441 Chronic obstructive pulmonary disease with (acute) exacerbation: Secondary | ICD-10-CM | POA: Diagnosis not present

## 2016-10-17 ENCOUNTER — Emergency Department
Admission: EM | Admit: 2016-10-17 | Discharge: 2016-10-17 | Disposition: A | Discharge status: Home / Self Care | Payer: Medicare Other | Attending: Emergency Medicine | Admitting: Emergency Medicine

## 2016-10-17 ENCOUNTER — Emergency Department: Payer: Medicare Other

## 2016-10-17 ENCOUNTER — Encounter: Payer: Self-pay | Admitting: Emergency Medicine

## 2016-10-17 DIAGNOSIS — Z87891 Personal history of nicotine dependence: Secondary | ICD-10-CM | POA: Diagnosis not present

## 2016-10-17 DIAGNOSIS — I10 Essential (primary) hypertension: Secondary | ICD-10-CM | POA: Insufficient documentation

## 2016-10-17 DIAGNOSIS — J449 Chronic obstructive pulmonary disease, unspecified: Secondary | ICD-10-CM | POA: Insufficient documentation

## 2016-10-17 DIAGNOSIS — E119 Type 2 diabetes mellitus without complications: Secondary | ICD-10-CM | POA: Diagnosis not present

## 2016-10-17 DIAGNOSIS — R1084 Generalized abdominal pain: Secondary | ICD-10-CM | POA: Insufficient documentation

## 2016-10-17 DIAGNOSIS — R109 Unspecified abdominal pain: Secondary | ICD-10-CM

## 2016-10-17 LAB — URINALYSIS, COMPLETE (UACMP) WITH MICROSCOPIC
Bacteria, UA: NONE SEEN
Bilirubin Urine: NEGATIVE
GLUCOSE, UA: 150 mg/dL — AB
HGB URINE DIPSTICK: NEGATIVE
KETONES UR: 5 mg/dL — AB
LEUKOCYTES UA: NEGATIVE
NITRITE: NEGATIVE
PROTEIN: 30 mg/dL — AB
Specific Gravity, Urine: 1.034 — ABNORMAL HIGH (ref 1.005–1.030)
pH: 5 (ref 5.0–8.0)

## 2016-10-17 LAB — BASIC METABOLIC PANEL
Anion gap: 9 (ref 5–15)
BUN: 19 mg/dL (ref 6–20)
CALCIUM: 9.2 mg/dL (ref 8.9–10.3)
CO2: 28 mmol/L (ref 22–32)
Chloride: 100 mmol/L — ABNORMAL LOW (ref 101–111)
Creatinine, Ser: 0.88 mg/dL (ref 0.44–1.00)
GFR calc Af Amer: >60 mL/min (ref >60)
GLUCOSE: 235 mg/dL — AB (ref 65–99)
Potassium: 4.7 mmol/L (ref 3.5–5.1)
Sodium: 137 mmol/L (ref 135–145)

## 2016-10-17 LAB — CBC
HEMATOCRIT: 38.4 % (ref 35.0–47.0)
Hemoglobin: 12.7 g/dL (ref 12.0–16.0)
MCH: 25.7 pg — AB (ref 26.0–34.0)
MCHC: 33 g/dL (ref 32.0–36.0)
MCV: 78 fL — ABNORMAL LOW (ref 80.0–100.0)
Platelets: 267 10*3/uL (ref 150–440)
RBC: 4.92 MIL/uL (ref 3.80–5.20)
RDW: 14.9 % — AB (ref 11.5–14.5)
WBC: 11.2 10*3/uL — ABNORMAL HIGH (ref 3.6–11.0)

## 2016-10-17 MED ORDER — ONDANSETRON HCL 4 MG/2ML IJ SOLN
4.0000 mg | Freq: Once | INTRAMUSCULAR | Status: AC
  Administered 2016-10-17: 4 mg via INTRAVENOUS
  Filled 2016-10-17: qty 2

## 2016-10-17 MED ORDER — MORPHINE SULFATE (PF) 4 MG/ML IV SOLN
4.0000 mg | Freq: Once | INTRAVENOUS | Status: AC
  Administered 2016-10-17: 4 mg via INTRAVENOUS
  Filled 2016-10-17: qty 1

## 2016-10-17 MED ORDER — HYDROMORPHONE HCL 1 MG/ML IJ SOLN
1.0000 mg | Freq: Once | INTRAMUSCULAR | Status: AC
  Administered 2016-10-17: 1 mg via INTRAVENOUS
  Filled 2016-10-17: qty 1

## 2016-10-17 MED ORDER — IOPAMIDOL (ISOVUE-300) INJECTION 61%
100.0000 mL | Freq: Once | INTRAVENOUS | Status: AC | PRN
  Administered 2016-10-17: 100 mL via INTRAVENOUS

## 2016-10-17 MED ORDER — IOPAMIDOL (ISOVUE-300) INJECTION 61%
30.0000 mL | Freq: Once | INTRAVENOUS | Status: AC
  Administered 2016-10-17: 30 mL via ORAL

## 2016-10-17 MED ORDER — OXYCODONE-ACETAMINOPHEN 5-325 MG PO TABS: 1.0000 | ORAL_TABLET | Freq: Three times a day (TID) | ORAL | 0 refills | Status: DC | PRN

## 2016-10-17 MED ORDER — SODIUM CHLORIDE 0.9 % IV SOLN
Freq: Once | INTRAVENOUS | Status: AC
  Administered 2016-10-17: 10:00:00 via INTRAVENOUS

## 2016-10-17 MED ORDER — DOCUSATE SODIUM 100 MG PO CAPS: 100.0000 mg | ORAL_CAPSULE | Freq: Every day | ORAL | 2 refills | Status: AC | PRN

## 2016-10-17 NOTE — ED Triage Notes (Signed)
Patient presents to ED via POV from home with c/o right flank pain that radiates to her right side. Patient states she was seen at her PCP for same, blood work was done and patient was placed on another oral diabetic medication. Patient unable to state what it is called. Patient also reports nausea. Patient denies CP or SOB at this time.

## 2016-10-17 NOTE — ED Notes (Signed)
Patient transported to CT at this time. 

## 2016-10-17 NOTE — ED Notes (Signed)
Ct notified pt finished with oral contrast.

## 2016-10-17 NOTE — ED Provider Notes (Addendum)
[facility] Emergency Department Provider Note       Time seen: ----------------------------------------- 9:58 AM on 10/17/2016 -----------------------------------------     I have reviewed the triage vital signs and the nursing notes.   HISTORY   Chief Complaint Flank Pain    HPI [name] is a 61 y.o. female who presents to the ED for severe right flank pain. Patient reports right flank pain that radiates to her right side. She was seen recently by her primary care doctor for same. She is not sure what the diagnosis was. She has had some nausea, describes the pain as sharp and unbearable. Nothing makes it better or worse. She denies any other associated symptoms.   Past Medical History:  Diagnosis Date  . Back pain   . COPD (chronic obstructive pulmonary disease) (HCC)   . Diabetes mellitus without complication (HCC)   . Hypercholesteremia   . Hypertension   . Stroke [facility](HCC)     Patient Active Problem List   Diagnosis Date Noted  . Pneumonia 10/27/2014    Past Surgical History:  Procedure Laterality Date  . CHOLECYSTECTOMY      Allergies Patient has no known allergies.  Social History Social History  Substance Use Topics  . Smoking status: Former Games developermoker  . Smokeless tobacco: Never Used  . Alcohol use No    Review of Systems Constitutional: Negative for fever. Cardiovascular: Negative for chest pain. Respiratory: Negative for shortness of breath. Gastrointestinal: Positive for abdominal pain, nausea Genitourinary: Negative for dysuria. Musculoskeletal: Negative for back pain. Skin: Negative for rash. Neurological: Negative for headaches, focal weakness or numbness.  All systems negative/normal/unremarkable except as stated in the HPI  ____________________________________________   PHYSICAL EXAM:  VITAL SIGNS: ED Triage Vitals  Enc Vitals Group     BP 10/17/16 0920 128/64     Pulse Rate 10/17/16 0916 67     Resp  10/17/16 0916 17     Temp 10/17/16 0916 97.6 F (36.4 C)     Temp Source 10/17/16 0916 Oral     SpO2 10/17/16 0916 100 %     Weight 10/17/16 0918 160 lb (72.6 kg)     Height 10/17/16 0918 5\' 1"  (1.549 m)     Head Circumference --      Peak Flow --      Pain Score 10/17/16 0916 9     Pain Loc --      Pain Edu? --      Excl. in GC? --     Constitutional: Alert and oriented. Mild distress Eyes: Conjunctivae are normal. Normal extraocular movements. ENT   Head: Normocephalic and atraumatic.   Nose: No congestion/rhinnorhea.   Mouth/Throat: Mucous membranes are moist.   Neck: No stridor. Cardiovascular: Normal rate, regular rhythm. No murmurs, rubs, or gallops. Respiratory: Normal respiratory effort without tachypnea nor retractions. Breath sounds are clear and equal bilaterally. No wheezes/rales/rhonchi. Gastrointestinal: Right flank tenderness, no rebound or guarding. Normal bowel sounds. Musculoskeletal: Nontender with normal range of motion in extremities. No lower extremity tenderness nor edema. Neurologic:  Normal speech and language. No gross focal neurologic deficits are appreciated.  Skin:  Skin is warm, dry and intact. No rash noted. Psychiatric: Mood and affect are normal. Speech and behavior are normal.  ____________________________________________  ED COURSE:  Pertinent labs & imaging results that were available during my care of the patient were reviewed by me and considered in my medical decision making (see chart for details). Patient presents for flank  pain, we will assess with labs and imaging as indicated.   Procedures ____________________________________________   LABS (pertinent positives/negatives)  Labs Reviewed  URINALYSIS, COMPLETE (UACMP) WITH MICROSCOPIC - Abnormal; Notable for the following:       Result Value   Color, Urine YELLOW (*)    APPearance CLOUDY (*)    Specific Gravity, Urine 1.034 (*)    Glucose, UA 150 (*)    Ketones, ur  5 (*)    Protein, ur 30 (*)    Squamous Epithelial / LPF 0-5 (*)    All other components within normal limits  BASIC METABOLIC PANEL - Abnormal; Notable for the following:    Chloride 100 (*)    Glucose, Bld 235 (*)    All other components within normal limits  CBC - Abnormal; Notable for the following:    WBC 11.2 (*)    MCV 78.0 (*)    MCH 25.7 (*)    RDW 14.9 (*)    All other components within normal limits    RADIOLOGY Images were viewed by me  CT renal protocol IMPRESSION: No acute abnormality. Negative for urinary tract calculi. Normal Appendix Repeat CT with contrast IMPRESSION: 1. Negative for age CT Abdomen and Pelvis. Normal appendix. No acute or inflammatory process identified. 2. Aortic Atherosclerosis (ICD10-I70.0). ____________________________________________  FINAL ASSESSMENT AND PLAN  Flank pain  Plan: Patient's labs and imaging were dictated above. Patient had presented for Flank pain of uncertain etiology. Labs and workup here have been reassuring, no clear etiology for her symptoms. She is stable for outpatient follow-up.   Emily Filbert, MD   Note: This note was generated in part or whole with voice recognition software. Voice recognition is usually quite accurate but there are transcription errors that can and very often do occur. I apologize for any typographical errors that were not detected and corrected.     Emily Filbert, MD 10/17/16 1119    Emily Filbert, MD 10/17/16 1230

## 2016-10-17 NOTE — ED Notes (Signed)
Patient transported to CT 

## 2016-10-17 NOTE — ED Notes (Addendum)
Critical result note charted in error, wrong patient.

## 2016-10-18 ENCOUNTER — Other Ambulatory Visit: Payer: Self-pay | Admitting: Internal Medicine

## 2016-10-18 DIAGNOSIS — R109 Unspecified abdominal pain: Secondary | ICD-10-CM

## 2017-05-02 DEATH — deceased

## 2017-10-09 ENCOUNTER — Encounter: Payer: Medicare Other | Attending: Nurse Practitioner | Admitting: Nurse Practitioner

## 2017-10-09 DIAGNOSIS — J449 Chronic obstructive pulmonary disease, unspecified: Secondary | ICD-10-CM | POA: Diagnosis not present

## 2017-10-09 DIAGNOSIS — E11622 Type 2 diabetes mellitus with other skin ulcer: Secondary | ICD-10-CM | POA: Insufficient documentation

## 2017-10-09 DIAGNOSIS — E1140 Type 2 diabetes mellitus with diabetic neuropathy, unspecified: Secondary | ICD-10-CM | POA: Insufficient documentation

## 2017-10-09 DIAGNOSIS — L97212 Non-pressure chronic ulcer of right calf with fat layer exposed: Secondary | ICD-10-CM | POA: Diagnosis present

## 2017-10-09 DIAGNOSIS — L409 Psoriasis, unspecified: Secondary | ICD-10-CM | POA: Insufficient documentation

## 2017-10-09 DIAGNOSIS — E7800 Pure hypercholesterolemia, unspecified: Secondary | ICD-10-CM | POA: Diagnosis not present

## 2017-10-09 DIAGNOSIS — I10 Essential (primary) hypertension: Secondary | ICD-10-CM | POA: Insufficient documentation

## 2017-10-10 NOTE — Progress Notes (Signed)
Kayla ShanksDAVIS, [name] ([id number]) Visit Report for 10/09/2017 Abuse/Suicide Risk Screen Details Patient Name: Kayla KeelDAVIS, [name] Date of Service: 10/09/2017 9:45 AM Medical Record Number: [id number] Patient Account Number: [id number] Date of Birth/Sex: 1956/02/11 (62 y.o. F) Treating RN: Curtis Sitesorthy, Joanna Primary Care Keonte Daubenspeck: Leotis ShamesSINGH, JASMINE Other Clinician: Referring Faylene Allerton: Referral, Self Treating Nayana Lenig/Extender: Kathreen Cosieroulter, Leah Weeks in Treatment: 0 Abuse/Suicide Risk Screen Items Answer ABUSE/SUICIDE RISK SCREEN: Has anyone close to you tried to hurt or harm you recentlyo No Do you feel uncomfortable with anyone in your familyo No Has anyone forced you do things that you didnot want to doo No Do you have any thoughts of harming yourselfo No Patient displays signs or symptoms of abuse and/or neglect. No Electronic Signature(s) Signed: 10/09/2017 5:06:29 PM By: Curtis Sitesorthy, Joanna Entered By: Curtis Sitesorthy, Joanna on 10/09/2017 10:27:55 Barrantes, Kayla CastillaEBORAH A. ([id number]) -------------------------------------------------------------------------------- Activities of Daily Living Details Patient Name: Kayla KeelAVIS, Tenna A. Date of Service: 10/09/2017 9:45 AM Medical Record Number: [id number] Patient Account Number: [id number] Date of Birth/Sex: 1956/02/11 (62 y.o. F) Treating RN: Curtis Sitesorthy, Joanna Primary Care Sallyann Kinnaird: Leotis ShamesSINGH, JASMINE Other Clinician: Referring Nea Gittens: Referral, Self Treating Aishani Kalis/Extender: Kathreen Cosieroulter, Leah Weeks in Treatment: 0 Activities of Daily Living Items Answer Activities of Daily Living (Please select one for each item) Drive Automobile Completely Able Take Medications Completely Able Use Telephone Completely Able Care for Appearance Completely Able Use Toilet Completely Able Bath / Shower Completely Able Dress Self Completely Able Feed Self Completely Able Walk Completely Able Get In / Out Bed Completely Able Housework Completely Able Prepare Meals Completely Able Handle  Money Completely Able Shop for Self Completely Able Electronic Signature(s) Signed: 10/09/2017 5:06:29 PM By: Curtis Sitesorthy, Joanna Entered By: Curtis Sitesorthy, Joanna on 10/09/2017 10:28:14 Deak, Kayla CastillaEBORAH A. ([id number]) -------------------------------------------------------------------------------- Education Assessment Details Patient Name: Kayla KeelAVIS, [place] A. Date of Service: 10/09/2017 9:45 AM Medical Record Number: [id number] Patient Account Number: [id number] Date of Birth/Sex: 1956/02/11 (62 y.o. F) Treating RN: Curtis Sitesorthy, Joanna Primary Care Georgios Kina: Leotis ShamesSINGH, JASMINE Other Clinician: Referring Anhad Sheeley: Referral, Self Treating Lary Eckardt/Extender: Kathreen Cosieroulter, Leah Weeks in Treatment: 0 Primary Learner Assessed: Patient Learning Preferences/Education Level/Primary Language Learning Preference: Explanation, Demonstration Highest Education Level: High School Preferred Language: English Cognitive Barrier Assessment/Beliefs Language Barrier: No Translator Needed: No Memory Deficit: No Emotional Barrier: No Cultural/Religious Beliefs Affecting Medical Care: No Physical Barrier Assessment Impaired Vision: No Impaired Hearing: No Decreased Hand dexterity: No Knowledge/Comprehension Assessment Knowledge Level: Medium Comprehension Level: Medium Ability to understand written Medium instructions: Ability to understand verbal Medium instructions: Motivation Assessment Anxiety Level: Calm Cooperation: Cooperative Education Importance: Acknowledges Need Interest in Health Problems: Asks Questions Perception: Coherent Willingness to Engage in Self- Medium Management Activities: Readiness to Engage in Self- Medium Management Activities: Electronic Signature(s) Signed: 10/09/2017 5:06:29 PM By: Curtis Sitesorthy, Joanna Entered By: Curtis Sitesorthy, Joanna on 10/09/2017 10:28:35 [name], Kayla CastillaEBORAH A. ([id number]) -------------------------------------------------------------------------------- Fall Risk Assessment  Details Patient Name: Kayla KeelAVIS, [name] Date of Service: 10/09/2017 9:45 AM Medical Record Number: [id number] Patient Account Number: [id number] Date of Birth/Sex: 1956/02/11 (62 y.o. F) Treating RN: Curtis Sitesorthy, Joanna Primary Care Reily Treloar: Leotis ShamesSINGH, JASMINE Other Clinician: Referring Jalayne Ganesh: Referral, Self Treating Sherea Liptak/Extender: Kathreen Cosieroulter, Leah Weeks in Treatment: 0 Fall Risk Assessment Items Have you had 2 or more falls in the last 12 monthso 0 No Have you had any fall that resulted in injury in the last 12 monthso 0 No FALL RISK ASSESSMENT: History of falling - immediate or within 3 months 0 No Secondary diagnosis 0 No Ambulatory aid None/bed rest/wheelchair/nurse 0 Yes Crutches/cane/walker 0 No Furniture 0 No IV Access/Saline Lock  0 No Gait/Training Normal/bed rest/immobile 0 No Weak 10 Yes Impaired 0 No Mental Status Oriented to own ability 0 Yes Electronic Signature(s) Signed: 10/09/2017 5:06:29 PM By: Curtis Sites Entered By: Curtis Sites on 10/09/2017 10:28:44 Petrizzo, Gavin Pound A. ([id number]) -------------------------------------------------------------------------------- Foot Assessment Details Patient Name: [name] A. Date of Service: 10/09/2017 9:45 AM Medical Record Number: [id number] Patient Account Number: [id number] Date of Birth/Sex: Nov 03, 1955 (62 y.o. F) Treating RN: Curtis Sites Primary Care Daion Ginsberg: Leotis Shames Other Clinician: Referring Chilton Sallade: Referral, Self Treating Ayme Short/Extender: Kathreen Cosier in Treatment: 0 Foot Assessment Items Site Locations + = Sensation present, - = Sensation absent, C = Callus, U = Ulcer R = Redness, W = Warmth, M = Maceration, PU = Pre-ulcerative lesion F = Fissure, S = Swelling, D = Dryness Assessment Right: Left: Other Deformity: No No Prior Foot Ulcer: No No Prior Amputation: No No Charcot Joint: No No Ambulatory Status: Ambulatory Without Help Gait: Steady Electronic Signature(s) Signed:  10/09/2017 5:06:29 PM By: Curtis Sites Entered By: Curtis Sites on 10/09/2017 10:30:35 [name], [name] ([id number]) -------------------------------------------------------------------------------- Nutrition Risk Assessment Details Patient Name: [name] A. Date of Service: 10/09/2017 9:45 AM Medical Record Number: [id number] Patient Account Number: [id number] Date of Birth/Sex: 1955/06/07 (62 y.o. F) Treating RN: Curtis Sites Primary Care Abdou Stocks: Leotis Shames Other Clinician: Referring Laquon Emel: Referral, Self Treating Loeta Herst/Extender: Kathreen Cosier in Treatment: 0 Height (in): 61 Weight (lbs): 157 Body Mass Index (BMI): 29.7 Nutrition Risk Assessment Items NUTRITION RISK SCREEN: I have an illness or condition that made me change the kind and/or amount of 0 No food I eat I eat fewer than two meals per day 0 No I eat few fruits and vegetables, or milk products 0 No I have three or more drinks of beer, liquor or wine almost every day 0 No I have tooth or mouth problems that make it hard for me to eat 0 No I don't always have enough money to buy the food I need 0 No I eat alone most of the time 0 No I take three or more different prescribed or over-the-counter drugs a day 1 Yes Without wanting to, I have lost or gained 10 pounds in the last six months 0 No I am not always physically able to shop, cook and/or feed myself 0 No Nutrition Protocols Good Risk Protocol 0 No interventions needed Moderate Risk Protocol Electronic Signature(s) Signed: 10/09/2017 5:06:29 PM By: Curtis Sites Entered By: Curtis Sites on 10/09/2017 10:28:50

## 2017-10-11 NOTE — Progress Notes (Addendum)
AVALIN, BRILEY ([id number]) Visit Report for 10/09/2017 Chief Complaint Document Details Patient Name: [name], [name] Date of Service: 10/09/2017 9:45 AM Medical Record Number: [id number] Patient Account Number: [id number] Date of Birth/Sex: Jul 01, 1955 (62 y.o. F) Treating RN: Phillis Haggis Primary Care Provider: Leotis Shames Other Clinician: Referring Provider: Leotis Shames Treating Provider/Extender: Kathreen Cosier in Treatment: 0 Information Obtained from: Patient Chief Complaint Right pretibial wound Electronic Signature(s) Signed: 10/09/2017 9:02:46 PM By: Bonnell Public Entered By: Bonnell Public on 10/09/2017 21:02:46 Bazinet, [name] ([id number]) -------------------------------------------------------------------------------- Debridement Details Patient Name: Kayla Keel A. Date of Service: 10/09/2017 9:45 AM Medical Record Number: [id number] Patient Account Number: [id number] Date of Birth/Sex: [birth year] (62 y.o. F) Treating RN: Phillis Haggis Primary Care Provider: Leotis Shames Other Clinician: Referring Provider: Leotis Shames Treating Provider/Extender: Kathreen Cosier in Treatment: 0 Debridement Performed for Wound #1 Right,Anterior Lower Leg Assessment: Performed By: Physician Bonnell Public, NP Debridement Type: Debridement Severity of Tissue Pre Fat layer exposed Debridement: Pre-procedure Verification/Time Yes - 11:11 Out Taken: Start Time: 11:11 Pain Control: Lidocaine 4% Topical Solution Total Area Debrided (L x W): 3.5 (cm) x 1 (cm) = 3.5 (cm) Tissue and other material Non-Viable, Eschar debrided: Level: Non-Viable Tissue Debridement Description: Selective/Open Wound Instrument: Curette Bleeding: None End Time: 11:12 Procedural Pain: 0 Post Procedural Pain: 0 Response to Treatment: Procedure was tolerated well Level of Consciousness: Awake and Alert Post Debridement Measurements of Total Wound Length: (cm) 3.5 Width:  (cm) 1 Depth: (cm) 0.1 Volume: (cm) 0.275 Character of Wound/Ulcer Post Debridement: Requires Further Debridement Severity of Tissue Post Debridement: Fat layer exposed Post Procedure Diagnosis Same as Pre-procedure Electronic Signature(s) Signed: 10/09/2017 9:02:24 PM By: Bonnell Public Signed: 10/10/2017 11:31:29 AM By: Alejandro Mulling Entered By: Bonnell Public on 10/09/2017 21:02:24 Tashiro, [name] ([id number]) -------------------------------------------------------------------------------- HPI Details Patient Name: Kayla Keel A. Date of Service: 10/09/2017 9:45 AM Medical Record Number: [id number] Patient Account Number: [id number] Date of Birth/Sex: 10-04-1955 (62 y.o. F) Treating RN: Phillis Haggis Primary Care Provider: Leotis Shames Other Clinician: Referring Provider: Leotis Shames Treating Provider/Extender: Kathreen Cosier in Treatment: 0 History of Present Illness HPI Description: 10/09/17-She presents today as an initial evaluation for a right pretibial ulcer. She states approximately 3 weeks ago she fell, lacerating the anterior aspect of her right lower shin. She states she was prescribed Levaquin which she completed last week. She states she has been applying steroid cream to this area with no noted improvement; is eschar covered. She denies pain to this area. 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 She states she has seen [facility] dermatology in [place] who has prescribed clobetasol ointment with no noted improvement. She has not been applying an occlusive dressing after the application of steroid cream. She states there is no known exacerbating or relieving factors. She does complain of pain, pruritus and admits that presentation today is significantly better than they flareup she had last week. 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  We have requested notes from dermatology and will review those. She does state she has had a biopsy to the right foot. Overall, she is a poor historian Psychologist, prison and probation services) Signed: 11/05/2017 8:38:20 AM By: Bonnell Public Previous Signature: 10/09/2017 9:07:16 PM Version By: Bonnell Public Entered By: Bonnell Public on 11/05/2017 08:38:20 Colvin, [name] ([id number]) -------------------------------------------------------------------------------- Physical Exam Details Patient Name: Kayla Keel A. Date of Service: 10/09/2017 9:45 AM Medical Record  Number: [id number] Patient Account Number: [id number] Date of Birth/Sex: 1955-09-04 (62 y.o. F) Treating RN: Phillis Haggis Primary Care Provider: Leotis Shames Other Clinician: Referring Provider: Leotis Shames Treating Provider/Extender: Kathreen Cosier in Treatment: 0 Respiratory respirations are even and unlabored. clear throughout. Cardiovascular s1 s2 regular rate and rhythm. RLE- palpable DP, PT. RLE- no edema, warm to touch. Musculoskeletal ambulates with no assistive devices. Integumentary (Hair, Skin) right plantar foot with pustules, red/brown intact lesions and evidence of healed/flaking skin. Psychiatric appears to have poor insight; poor historian. oriented x4. Electronic Signature(s) Signed: 10/09/2017 9:09:24 PM By: Bonnell Public Entered By: Bonnell Public on 10/09/2017 21:09:24 Flo Shanks ([id number]) -------------------------------------------------------------------------------- Physician Orders Details Patient Name: Kayla Keel A. Date of Service: 10/09/2017 9:45 AM Medical Record Number: [id number] Patient Account Number: [id number] Date of Birth/Sex: 1955/09/12 (62 y.o. F) Treating RN: Phillis Haggis Primary Care Provider: Leotis Shames Other Clinician: Referring Provider: Leotis Shames Treating Provider/Extender: Kathreen Cosier in Treatment: 0 Verbal / Phone Orders: Yes Clinician:  Pinkerton, Debi Read Back and Verified: Yes Diagnosis Coding Wound Cleansing Wound #1 Right,Anterior Lower Leg o Clean wound with Normal Saline. o Cleanse wound with mild soap and water Anesthetic (add to Medication List) Wound #1 Right,Anterior Lower Leg o Topical Lidocaine 4% cream applied to wound bed prior to debridement (In Clinic Only). Primary Wound Dressing Wound #1 Right,Anterior Lower Leg o Saline moistened gauze o Santyl Ointment Secondary Dressing Wound #1 Right,Anterior Lower Leg o Dry Gauze o [place] Dressing Change Frequency Wound #1 Right,Anterior Lower Leg o Change dressing every day. Edema Control Wound #1 Right,Anterior Lower Leg o Elevate legs to the level of the heart and pump ankles as often as possible Additional Orders / Instructions Wound #1 Right,Anterior Lower Leg o Increase protein intake. Patient Medications Allergies: No Known Allergies Notifications Medication Indication Start End lidocaine DOSE 1 - topical 4 % cream - 1 cream topical Electronic Signature(s) NAIOMY, WATTERS A. ([id number]) Signed: 10/09/2017 9:10:59 PM By: Bonnell Public Signed: 10/10/2017 11:31:29 AM By: Alejandro Mulling Entered By: Alejandro Mulling on 10/09/2017 11:11:05 Demeo, [name] ([id number]) -------------------------------------------------------------------------------- Prescription 10/09/2017 Patient Name: Kayla Keel A. Provider: Bonnell Public NP Date of Birth: 05-Dec-1955 NPI#: [id number] Sex: F DEA#: [id number] Phone #: [phone] License #: Patient Address: [facility] [address] [facility] [place], [place] [address] [address] [place], [place] [address] [phone] Allergies No Known Allergies Medication Medication: Route: Strength: Form: lidocaine topical 4% cream Class: TOPICAL LOCAL ANESTHETICS Dose: Frequency / Time: Indication: 1 1 cream  topical Number of Refills: Number of Units: 0 Generic Substitution: Start Date: End Date: Administered at Substitution Permitted Facility: Yes Time Administered: Time Discontinued: Note to Pharmacy: Signature(s): Date(s): Electronic Signature(s) Signed: 10/09/2017 9:10:59 PM By: Bonnell Public Signed: 10/10/2017 11:31:29 AM By: Alejandro Mulling Entered By: Alejandro Mulling on 10/09/2017 11:11:06 Mclouth, [name] ([id number]) Melichar, Yuna AMarland Kitchen ([id number]) --------------------------------------------------------------------------------  Problem List Details Patient Name: Kayla Keel A. Date of Service: 10/09/2017 9:45 AM Medical Record Number: [id number] Patient Account Number: [id number] Date of Birth/Sex: June 25, 1955 (62 y.o. F) Treating RN: Phillis Haggis Primary Care Provider: Leotis Shames Other Clinician: Referring Provider: Leotis Shames Treating Provider/Extender: Kathreen Cosier in Treatment: 0 Active Problems ICD-10 Evaluated Encounter Code Description Active Date Today Diagnosis L97.212 Non-pressure chronic ulcer of right calf with fat layer exposed 10/09/2017 No Yes L40.9 Psoriasis, unspecified 10/09/2017 No Yes Inactive Problems Resolved Problems Electronic Signature(s) Signed: 10/09/2017 9:01:21 PM By: Bonnell Public Entered By:  Sanaai Doane on 10/09/2017 21:01:21 BRITNE, BORELLI ([id number]) -------------------------------------------------------------------------------- Progress Note Details Patient Name: CLARITZA, JULY A. Date of Service: 10/09/2017 9:45 AM Medical Record Number: [id number] Patient Account Number: [id number] Date of Birth/Sex: [birth year] (62 y.o. F) Treating RN: Phillis Haggis Primary Care Provider: Leotis Shames Other Clinician: Referring Provider: Leotis Shames Treating Provider/Extender: Kathreen Cosier in Treatment: 0 Subjective Chief Complaint Information obtained from Patient Right pretibial wound History of  Present Illness (HPI) 10/09/17-She presents today as an initial evaluation for a right pretibial ulcer. She states approximately 3 weeks ago she fell, lacerating the anterior aspect of her right lower shin. She states she was prescribed Levaquin which she completed last week. She states she has been applying steroid cream to this area with no noted improvement; is eschar covered. She denies pain to this area. 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 She states she has seen [facility] dermatology in [place] who has prescribed clobetasol ointment with no noted improvement. She has not been applying an occlusive dressing after the application of steroid cream. She states there is no known exacerbating or relieving factors. She does complain of pain, pruritus and admits that presentation today is significantly better than they flareup she had last week. 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 We have requested notes from dermatology and will review those. She does state she has had a biopsy to the right foot. Overall, she is a poor historian Wound History Patient presents with 1 open wound that has been present for approximately 3 weeks. Patient has been treating wound in the following manner: bandaid. Laboratory tests have not been performed in the last month. Patient reportedly has not tested positive for an antibiotic resistant organism. Patient reportedly has not tested positive for osteomyelitis. Patient reportedly has not had testing performed to evaluate circulation in the legs. Patient History Information obtained from Patient. Allergies No Known Allergies Family History Cancer - Mother, Heart Disease - Father, Lung Disease - Mother, No family history of Diabetes, Hereditary Spherocytosis, Hypertension, Kidney Disease, Seizures, Stroke, Thyroid  Problems, Tuberculosis. Social History Former smoker, Marital Status - Widowed, Alcohol Use - Rarely, Drug Use - No History, Caffeine Use - Moderate. Medical History Eyes Denies history of Cataracts, Glaucoma, Optic Neuritis Ear/Nose/Mouth/Throat Denies history of Chronic sinus problems/congestion, Middle ear problems Hematologic/Lymphatic Denies history of Anemia, Hemophilia, Human Immunodeficiency Virus, Lymphedema, Sickle Cell Disease Cisek, Serrina A. ([id number]) Respiratory Patient has history of Chronic Obstructive Pulmonary Disease (COPD) - chronic bronchitis Denies history of Aspiration, Asthma, Pneumothorax, Sleep Apnea, Tuberculosis Cardiovascular Patient has history of Hypertension Denies history of Angina, Arrhythmia, Congestive Heart Failure, Coronary Artery Disease, Deep Vein Thrombosis, Hypotension, Myocardial Infarction, Peripheral Arterial Disease, Peripheral Venous Disease, Phlebitis, Vasculitis Gastrointestinal Denies history of Cirrhosis , Colitis, Crohn s, Hepatitis A, Hepatitis B, Hepatitis C Endocrine Patient has history of Type II Diabetes Denies history of Type I Diabetes Genitourinary Denies history of End Stage Renal Disease Immunological Denies history of Lupus Erythematosus, Raynaud s, Scleroderma Integumentary (Skin) Denies history of History of Burn, History of pressure wounds Musculoskeletal Denies history of Gout, Rheumatoid Arthritis, Osteoarthritis, Osteomyelitis Neurologic Patient has history of Neuropathy Denies history of Dementia, Quadriplegia, Paraplegia, Seizure Disorder Oncologic Denies history of Received Chemotherapy, Received Radiation Psychiatric Denies history of Anorexia/bulimia, Confinement Anxiety Medical And Surgical History Notes Cardiovascular hypercholesteremia Neurologic CVA in history but patient denies Review of Systems (ROS) Constitutional Symptoms (  General Health) The patient has no complaints or  symptoms. Eyes Denies complaints or symptoms of Dry Eyes, Vision Changes, Glasses / Contacts. Ear/Nose/Mouth/Throat Denies complaints or symptoms of Difficult clearing ears, Sinusitis. Hematologic/Lymphatic Denies complaints or symptoms of Bleeding / Clotting Disorders, Human Immunodeficiency Virus. Respiratory Denies complaints or symptoms of Chronic or frequent coughs, Shortness of Breath. Cardiovascular Denies complaints or symptoms of Chest pain, LE edema. Gastrointestinal Denies complaints or symptoms of Frequent diarrhea, Nausea, Vomiting. Endocrine Denies complaints or symptoms of Hepatitis, Thyroid disease, Polydypsia (Excessive Thirst). Genitourinary Denies complaints or symptoms of Kidney failure/ Dialysis, Incontinence/dribbling. Immunological Denies complaints or symptoms of Hives, Itching. Integumentary (Skin) Complains or has symptoms of Wounds. Denies complaints or symptoms of Bleeding or bruising tendency, Breakdown, Swelling. Musculoskeletal Denies complaints or symptoms of Muscle Pain, Muscle Weakness. Kayla KeelDAVIS, Dodie AMarland Kitchen. ([id number]) Neurologic Denies complaints or symptoms of Numbness/parasthesias, Focal/Weakness. Psychiatric Denies complaints or symptoms of Anxiety, Claustrophobia. Objective Constitutional Vitals Time Taken: 10:23 AM, Height: 61 in, Source: Measured, Weight: 157 lbs, Source: Measured, BMI: 29.7, Temperature: 98.2 F, Pulse: 72 bpm, Respiratory Rate: 16 breaths/min, Blood Pressure: 117/62 mmHg. Respiratory respirations are even and unlabored. clear throughout. Cardiovascular s1 s2 regular rate and rhythm. RLE- palpable DP, PT. RLE- no edema, warm to touch. Musculoskeletal ambulates with no assistive devices. Psychiatric appears to have poor insight; poor historian. oriented x4. Integumentary (Hair, Skin) right plantar foot with pustules, red/brown intact lesions and evidence of healed/flaking skin. Wound #1 status is Open. Original cause  of wound was Trauma. The wound is located on the Right,Anterior Lower Leg. The wound measures 3.5cm length x 1cm width x 0.1cm depth; 2.749cm^2 area and 0.275cm^3 volume. There is no tunneling or undermining noted. There is a none present amount of drainage noted. The wound margin is flat and intact. There is no granulation within the wound bed. There is a large (67-100%) amount of necrotic tissue within the wound bed including Eschar. The periwound skin appearance did not exhibit: Callus, Crepitus, Excoriation, Induration, Rash, Scarring, Dry/Scaly, Maceration, Atrophie Blanche, Cyanosis, Ecchymosis, Hemosiderin Staining, Mottled, Pallor, Rubor, Erythema. Periwound temperature was noted as No Abnormality. 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 ([id number]) Procedures Wound #1 Pre-procedure diagnosis of Wound #1 is a Diabetic Wound/Ulcer of the Lower Extremity located on the Right,Anterior Lower Leg .Severity of Tissue Pre Debridement is: Fat layer exposed. There was a Selective/Open Wound Non-Viable Tissue Debridement with a total area of 3.5 sq cm performed by Bonnell Publicoulter, Anaiyah Anglemyer, NP. With the following instrument(s): Curette to remove Non-Viable tissue/material. Material removed includes Eschar after achieving pain control using Lidocaine 4% Topical Solution. No specimens were taken. A time out was conducted at 11:11, prior to the start of the procedure. There was no bleeding. The procedure was tolerated well with a pain level of 0 throughout and a pain level of 0 following the procedure. Patient s Level of Consciousness post procedure was recorded as Awake and Alert. Post Debridement Measurements: 3.5cm length x 1cm width x 0.1cm depth; 0.275cm^3 volume. Character of Wound/Ulcer Post Debridement requires further debridement. Severity of Tissue Post Debridement is: Fat layer exposed. Post  procedure Diagnosis Wound #1: Same as Pre-Procedure Plan Wound Cleansing: Wound #1 Right,Anterior Lower Leg: Clean wound with Normal Saline. Cleanse wound with mild soap and water Anesthetic (add to Medication List): Wound #1 Right,Anterior Lower Leg: Topical Lidocaine 4% cream applied to wound bed prior to debridement (In Clinic Only). Primary Wound Dressing: Wound #  1 Right,Anterior Lower Leg: Saline moistened gauze Santyl Ointment Secondary Dressing: Wound #1 Right,Anterior Lower Leg: Dry Gauze [place] Dressing Change Frequency: Wound #1 Right,Anterior Lower Leg: Change dressing every day. Edema Control: Wound #1 Right,Anterior Lower Leg: Elevate legs to the level of the heart and pump ankles as often as possible Additional Orders / Instructions: Wound #1 Right,Anterior Lower Leg: Increase protein intake. The following medication(s) was prescribed: lidocaine topical 4 % cream 1 1 cream topical was prescribed at facility Electronic Signature(s) Signed: 11/05/2017 8:38:35 AM By: Bonnell Public Previous Signature: 10/09/2017 9:09:38 PM Version By: Valentina Gu, Addilynne A. ([id number]) Entered By: Bonnell Public on 11/05/2017 08:38:34 Sthilaire, [name] ([id number]) -------------------------------------------------------------------------------- ROS/PFSH Details Patient Name: Kayla Keel A. Date of Service: 10/09/2017 9:45 AM Medical Record Number: [id number] Patient Account Number: [id number] Date of Birth/Sex: 12/19/1955 (62 y.o. F) Treating RN: Curtis Sites Primary Care Provider: Leotis Shames Other Clinician: Referring Provider: Leotis Shames Treating Provider/Extender: Kathreen Cosier in Treatment: 0 Information Obtained From Patient Wound History Do you currently have one or more open woundso Yes How many open wounds do you currently haveo 1 Approximately how long have you had your woundso 3 weeks How have you been treating your wound(s) until nowo  bandaid Has your wound(s) ever healed and then re-openedo No Have you had any lab work done in the past montho No Have you tested positive for an antibiotic resistant organism (MRSA, VRE)o No Have you tested positive for osteomyelitis (bone infection)o No Have you had any tests for circulation on your legso No Constitutional Symptoms (General Health) Complaints and Symptoms: No Complaints or Symptoms Complaints and Symptoms: Negative for: Fatigue; Fever; Chills; Marked Weight Change Eyes Complaints and Symptoms: Negative for: Dry Eyes; Vision Changes; Glasses / Contacts Medical History: Negative for: Cataracts; Glaucoma; Optic Neuritis Ear/Nose/Mouth/Throat Complaints and Symptoms: Negative for: Difficult clearing ears; Sinusitis Medical History: Negative for: Chronic sinus problems/congestion; Middle ear problems Hematologic/Lymphatic Complaints and Symptoms: Negative for: Bleeding / Clotting Disorders; Human Immunodeficiency Virus Medical History: Negative for: Anemia; Hemophilia; Human Immunodeficiency Virus; Lymphedema; Sickle Cell Disease Respiratory Complaints and Symptoms: Negative for: Chronic or frequent coughs; Shortness of Breath Popko, Colin A. ([id number]) Medical History: Positive for: Chronic Obstructive Pulmonary Disease (COPD) - chronic bronchitis Negative for: Aspiration; Asthma; Pneumothorax; Sleep Apnea; Tuberculosis Cardiovascular Complaints and Symptoms: Negative for: Chest pain; LE edema Medical History: Positive for: Hypertension Negative for: Angina; Arrhythmia; Congestive Heart Failure; Coronary Artery Disease; Deep Vein Thrombosis; Hypotension; Myocardial Infarction; Peripheral Arterial Disease; Peripheral Venous Disease; Phlebitis; Vasculitis Past Medical History Notes: hypercholesteremia Gastrointestinal Complaints and Symptoms: Negative for: Frequent diarrhea; Nausea; Vomiting Medical History: Negative for: Cirrhosis ; Colitis; Crohnos;  Hepatitis A; Hepatitis B; Hepatitis C Endocrine Complaints and Symptoms: Negative for: Hepatitis; Thyroid disease; Polydypsia (Excessive Thirst) Medical History: Positive for: Type II Diabetes Negative for: Type I Diabetes Genitourinary Complaints and Symptoms: Negative for: Kidney failure/ Dialysis; Incontinence/dribbling Medical History: Negative for: End Stage Renal Disease Immunological Complaints and Symptoms: Negative for: Hives; Itching Medical History: Negative for: Lupus Erythematosus; Raynaudos; Scleroderma Integumentary (Skin) Complaints and Symptoms: Positive for: Wounds Negative for: Bleeding or bruising tendency; Breakdown; Swelling Medical History: Negative for: History of Burn; History of pressure wounds Kennerly, Tiajah A. ([id number]) Musculoskeletal Complaints and Symptoms: Negative for: Muscle Pain; Muscle Weakness Medical History: Negative for: Gout; Rheumatoid Arthritis; Osteoarthritis; Osteomyelitis Neurologic Complaints and Symptoms: Negative for: Numbness/parasthesias; Focal/Weakness Medical History: Positive for: Neuropathy Negative for: Dementia; Quadriplegia; Paraplegia; Seizure Disorder Past Medical History Notes: CVA in history but patient  denies Psychiatric Complaints and Symptoms: Negative for: Anxiety; Claustrophobia Medical History: Negative for: Anorexia/bulimia; Confinement Anxiety Oncologic Medical History: Negative for: Received Chemotherapy; Received Radiation Immunizations Pneumococcal Vaccine: Received Pneumococcal Vaccination: Yes Implantable Devices Family and Social History Cancer: Yes - Mother; Diabetes: No; Heart Disease: Yes - Father; Hereditary Spherocytosis: No; Hypertension: No; Kidney Disease: No; Lung Disease: Yes - Mother; Seizures: No; Stroke: No; Thyroid Problems: No; Tuberculosis: No; Former smoker; Marital Status - Widowed; Alcohol Use: Rarely; Drug Use: No History; Caffeine Use: Moderate; Financial  Concerns: No; Food, Clothing or Shelter Needs: No; Support System Lacking: No; Transportation Concerns: No; Advanced Directives: No; Patient does not want information on Advanced Directives Electronic Signature(s) Signed: 10/09/2017 5:06:29 PM By: Curtis Sites Signed: 10/09/2017 9:10:59 PM By: Bonnell Public Entered By: Curtis Sites on 10/09/2017 10:34:56 Bolinger, [name] ([id number]) -------------------------------------------------------------------------------- SuperBill Details Patient Name: Kayla Keel A. Date of Service: 10/09/2017 Medical Record Number: [id number] Patient Account Number: [id number] Date of Birth/Sex: 24-Jul-1955 (62 y.o. 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

## 2017-10-11 NOTE — Progress Notes (Signed)
HAYDYN, LIDDELL ([id number]) Visit Report for 10/09/2017 Allergy List Details Patient Name: [name], [name] Date of Service: 10/09/2017 9:45 AM Medical Record Number: [id number] Patient Account Number: [id number] Date of Birth/Sex: [birth year] (62 y.o. F) Treating RN: Curtis Sites Primary Care Lael Pilch: Leotis Shames Other Clinician: Referring Ary Lavine: Referral, Self Treating Nickolai Rinks/Extender: Kathreen Cosier in Treatment: 0 Allergies Active Allergies No Known Allergies Allergy Notes Electronic Signature(s) Signed: 10/09/2017 5:06:29 PM By: Curtis Sites Entered By: Curtis Sites on 10/09/2017 10:27:46 Wisher, Bary [name] ([id number]) -------------------------------------------------------------------------------- Arrival Information Details Patient Name: Kayla Keel A. Date of Service: 10/09/2017 9:45 AM Medical Record Number: [id number] Patient Account Number: [id number] Date of Birth/Sex: 1955-12-02 (62 y.o. F) Treating RN: Curtis Sites Primary Care Tifini Reeder: Leotis Shames Other Clinician: Referring Ahaana Rochette: Referral, Self Treating Verona Hartshorn/Extender: Kathreen Cosier in Treatment: 0 Visit Information Patient Arrived: Ambulatory Arrival Time: 10:21 Accompanied By: self Transfer Assistance: None Patient Identification Verified: Yes Secondary Verification Process Completed: Yes Patient Has Alerts: Yes Patient Alerts: DMII Electronic Signature(s) Signed: 10/09/2017 5:06:29 PM By: Curtis Sites Entered By: Curtis Sites on 10/09/2017 10:22:22 [name] ([id number]) -------------------------------------------------------------------------------- Clinic Level of Care Assessment Details Patient Name: Kayla Keel A. Date of Service: 10/09/2017 9:45 AM Medical Record Number: [id number] Patient Account Number: [id number] Date of Birth/Sex: 06/07/1955 (62 y.o. F) Treating RN: Phillis Haggis Primary Care Kiko Ripp: Leotis Shames Other  Clinician: Referring Taytem Ghattas: Referral, Self Treating Justun Anaya/Extender: Kathreen Cosier in Treatment: 0 Clinic Level of Care Assessment Items TOOL 1 Quantity Score X - Use when EandM and Procedure is performed on INITIAL visit 1 0 ASSESSMENTS - Nursing Assessment / Reassessment X - General Physical Exam (combine w/ comprehensive assessment (listed just below) when 1 20 performed on new pt. evals) X- 1 25 Comprehensive Assessment (HX, ROS, Risk Assessments, Wounds Hx, etc.) ASSESSMENTS - Wound and Skin Assessment / Reassessment []  - Dermatologic / Skin Assessment (not related to wound area) 0 ASSESSMENTS - Ostomy and/or Continence Assessment and Care []  - Incontinence Assessment and Management 0 []  - 0 Ostomy Care Assessment and Management (repouching, etc.) PROCESS - Coordination of Care X - Simple Patient / Family Education for ongoing care 1 15 []  - 0 Complex (extensive) Patient / Family Education for ongoing care []  - 0 Staff obtains Chiropractor, Records, Test Results / Process Orders []  - 0 Staff telephones HHA, Nursing Homes / Clarify orders / etc []  - 0 Routine Transfer to another Facility (non-emergent condition) []  - 0 Routine Hospital Admission (non-emergent condition) X- 1 15 New Admissions / Manufacturing engineer / Ordering NPWT, Apligraf, etc. []  - 0 Emergency Hospital Admission (emergent condition) PROCESS - Special Needs []  - Pediatric / Minor Patient Management 0 []  - 0 Isolation Patient Management []  - 0 Hearing / Language / Visual special needs []  - 0 Assessment of Community assistance (transportation, D/C planning, etc.) []  - 0 Additional assistance / Altered mentation []  - 0 Support Surface(s) Assessment (bed, cushion, seat, etc.) [name], [name] ([id number]) INTERVENTIONS - Miscellaneous []  - External ear exam 0 []  - 0 Patient Transfer (multiple staff / Nurse, adult / Similar devices) []  - 0 Simple Staple / Suture removal (25 or less) []   - 0 Complex Staple / Suture removal (26 or more) []  - 0 Hypo/Hyperglycemic Management (do not check if billed separately) X- 1 15 Ankle / Brachial Index (ABI) - do not check if billed separately Has the patient been seen at the hospital within the last three years: Yes Total Score: 90 Level Of Care:  New/Established - Level 3 Electronic Signature(s) Signed: 10/10/2017 11:31:29 AM By: Alejandro Mulling Entered By: Alejandro Mulling on 10/09/2017 13:01:35 [name] ([id number]) -------------------------------------------------------------------------------- Encounter Discharge Information Details Patient Name: Kayla Keel A. Date of Service: 10/09/2017 9:45 AM Medical Record Number: [id number] Patient Account Number: [id number] Date of Birth/Sex: 15-Jan-1956 (62 y.o. F) Treating RN: [name] Primary Care [name]: Leotis Shames Other Clinician: Referring Primo Innis: Referral, Self Treating [name]/Extender: Kathreen Cosier in Treatment: 0 Encounter Discharge Information Items Discharge Condition: Stable Ambulatory Status: Ambulatory Discharge Destination: Home Transportation: Private Auto Schedule Follow-up Appointment: Yes Clinical Summary of Care: Electronic Signature(s) Signed: 10/10/2017 10:58:55 AM By: [name] Entered By: [name] on 10/09/2017 11:20:39 [name], Bary [name] ([id number]) -------------------------------------------------------------------------------- Lower Extremity Assessment Details Patient Name: Kayla Keel A. Date of Service: 10/09/2017 9:45 AM Medical Record Number: [id number] Patient Account Number: [id number] Date of Birth/Sex: 10/16/1955 (62 y.o. F) Treating RN: Curtis Sites Primary Care [name]: Leotis Shames Other Clinician: Referring [name]: Referral, Self Treating Joseline Mccampbell/Extender: Kathreen Cosier in Treatment: 0 Edema Assessment Assessed: [Left: No] [Right: No] Edema: [Left: No] [Right: No] Vascular  Assessment Pulses: Dorsalis Pedis Palpable: [Left:Yes] [Right:Yes] Doppler Audible: [Left:Yes] [Right:Yes] Posterior Tibial Palpable: [Left:Yes] [Right:Yes] Doppler Audible: [Left:Yes] [Right:Yes] Extremity colors, hair growth, and conditions: Extremity Color: [Left:Normal] [Right:Normal] Hair Growth on Extremity: [Left:Yes] [Right:Yes] Temperature of Extremity: [Left:Warm] [Right:Warm] Capillary Refill: [Left:< 3 seconds] [Right:< 3 seconds] Blood Pressure: Brachial: [Left:114] [Right:110] Dorsalis Pedis: 98 [Left:Dorsalis Pedis: 120] Ankle: Posterior Tibial: 118 [Left:Posterior Tibial: 106 1.04] [Right:1.05] Toe Nail Assessment Left: Right: Thick: Yes Yes Discolored: Yes Yes Deformed: No No Improper Length and Hygiene: No No Electronic Signature(s) Signed: 10/09/2017 5:06:29 PM By: Curtis Sites Entered By: Curtis Sites on 10/09/2017 10:47:08 Wile, Cathryn AMarland Kitchen ([id number]) -------------------------------------------------------------------------------- Multi Wound Chart Details Patient Name: Kayla Keel A. Date of Service: 10/09/2017 9:45 AM Medical Record Number: [id number] Patient Account Number: [id number] Date of Birth/Sex: [birth year] (62 y.o. F) Treating RN: Phillis Haggis Primary Care Taylah Dubiel: Leotis Shames Other Clinician: Referring Nakyah Erdmann: Referral, Self Treating Gemma Ruan/Extender: Kathreen Cosier in Treatment: 0 Vital Signs Height(in): 61 Pulse(bpm): 72 Weight(lbs): 157 Blood Pressure(mmHg): 117/62 Body Mass Index(BMI): 30 Temperature(F): 98.2 Respiratory Rate 16 (breaths/min): Photos: [N/A:N/A] Wound Location: Right Lower Leg - Anterior N/A N/A Wounding Event: Trauma N/A N/A Primary Etiology: Diabetic Wound/Ulcer of the N/A N/A Lower Extremity Comorbid History: Chronic Obstructive N/A N/A Pulmonary Disease (COPD), Hypertension, Type II Diabetes, Neuropathy Date Acquired: 09/25/2017 N/A N/A Weeks of Treatment: 0 N/A N/A Wound Status:  Open N/A N/A Measurements L x W x D 3.5x1x0.1 N/A N/A (cm) Area (cm) : 2.749 N/A N/A Volume (cm) : 0.275 N/A N/A Classification: Grade 1 N/A N/A Exudate Amount: None Present N/A N/A Wound Margin: Flat and Intact N/A N/A Granulation Amount: None Present (0%) N/A N/A Necrotic Amount: Large (67-100%) N/A N/A Necrotic Tissue: Eschar N/A N/A Exposed Structures: Fascia: No N/A N/A Fat Layer (Subcutaneous Tissue) Exposed: No Tendon: No Muscle: No Joint: No Bone: No Epithelialization: None N/A N/A Ostrander, Lyllie A. ([id number]) Debridement: Debridement - Selective/Open N/A N/A Wound Pre-procedure 11:11 N/A N/A Verification/Time Out Taken: Pain Control: Lidocaine 4% Topical Solution N/A N/A Tissue Debrided: Necrotic/Eschar N/A N/A Level: Non-Viable Tissue N/A N/A Debridement Area (sq cm): 3.5 N/A N/A Instrument: Curette N/A N/A Bleeding: None N/A N/A Procedural Pain: 0 N/A N/A Post Procedural Pain: 0 N/A N/A Debridement Treatment Procedure was tolerated well N/A N/A Response: Post Debridement 3.5x1x0.1 N/A N/A Measurements L x W x D (cm) Post Debridement Volume: 0.275  N/A N/A (cm) Periwound Skin Texture: Excoriation: No N/A N/A Induration: No Callus: No Crepitus: No Rash: No Scarring: No Periwound Skin Moisture: Maceration: No N/A N/A Dry/Scaly: No Periwound Skin Color: Atrophie Blanche: No N/A N/A Cyanosis: No Ecchymosis: No Erythema: No Hemosiderin Staining: No Mottled: No Pallor: No Rubor: No Temperature: No Abnormality N/A N/A Tenderness on Palpation: No N/A N/A Wound Preparation: Ulcer Cleansing: N/A N/A Rinsed/Irrigated with Saline Topical Anesthetic Applied: Other: lidocaine 4% Procedures Performed: Debridement N/A N/A Treatment Notes Wound #1 (Right, Anterior Lower Leg) 1. Cleansed with: Clean wound with Normal Saline 2. Anesthetic Topical Lidocaine 4% cream to wound bed prior to debridement 4. Dressing Applied: Santyl Ointment 5.  Secondary Dressing Applied Bordered Foam Dressing Dry Gauze Notes Weckerly, Justyne A. ([id number]) saline wet gauze over santyl then dry gaUZE Electronic Signature(s) Signed: 10/09/2017 9:01:37 PM By: Bonnell Public Entered By: Bonnell Public on 10/09/2017 21:01:36 Kabler, Bary [name] ([id number]) -------------------------------------------------------------------------------- Multi-Disciplinary Care Plan Details Patient Name: Kayla Keel A. Date of Service: 10/09/2017 9:45 AM Medical Record Number: [id number] Patient Account Number: [id number] Date of Birth/Sex: 1956/02/13 (62 y.o. F) Treating RN: Phillis Haggis Primary Care Sheniya Garciaperez: Leotis Shames Other Clinician: Referring Dion Sibal: Referral, Self Treating Nikcole Eischeid/Extender: Kathreen Cosier in Treatment: 0 Active Inactive ` Abuse / Safety / Falls / Self Care Management Nursing Diagnoses: Potential for falls Goals: Patient will not experience any injury related to falls Date Initiated: 10/09/2017 Target Resolution Date: 02/07/2018 Goal Status: Active Interventions: Assess Activities of Daily Living upon admission and as needed Assess fall risk on admission and as needed Assess: immobility, friction, shearing, incontinence upon admission and as needed Notes: ` Nutrition Nursing Diagnoses: Imbalanced nutrition Impaired glucose control: actual or potential Potential for alteratiion in Nutrition/Potential for imbalanced nutrition Goals: Patient/caregiver agrees to and verbalizes understanding of need to use nutritional supplements and/or vitamins as prescribed Date Initiated: 10/09/2017 Target Resolution Date: 01/03/2018 Goal Status: Active Patient/caregiver will maintain therapeutic glucose control Date Initiated: 10/09/2017 Target Resolution Date: 02/07/2018 Goal Status: Active Interventions: Assess patient nutrition upon admission and as needed per policy Provide education on elevated blood sugars and impact on wound  healing Provide education on nutrition Notes: ` Orientation to the Wound Care Program BRADI, ARBUTHNOT ([id number]) Nursing Diagnoses: Knowledge deficit related to the wound healing center program Goals: Patient/caregiver will verbalize understanding of the Wound Healing Center Program Date Initiated: 10/09/2017 Target Resolution Date: 11/08/2017 Goal Status: Active Interventions: Provide education on orientation to the wound center Notes: ` Pain, Acute or Chronic Nursing Diagnoses: Pain, acute or chronic: actual or potential Potential alteration in comfort, pain Goals: Patient/caregiver will verbalize adequate pain control between visits Date Initiated: 10/09/2017 Target Resolution Date: 01/03/2018 Goal Status: Active Interventions: Complete pain assessment as per visit requirements Encourage patient to take pain medications as prescribed Notes: ` Wound/Skin Impairment Nursing Diagnoses: Impaired tissue integrity Knowledge deficit related to ulceration/compromised skin integrity Goals: Ulcer/skin breakdown will have a volume reduction of 80% by week 12 Date Initiated: 10/09/2017 Target Resolution Date: 01/03/2018 Goal Status: Active Interventions: Assess patient/caregiver ability to perform ulcer/skin care regimen upon admission and as needed Assess ulceration(s) every visit Notes: Electronic Signature(s) Signed: 10/10/2017 11:31:29 AM By: Alejandro Mulling Entered By: Alejandro Mulling on 10/09/2017 11:03:17 Bunten, Bary [name] ([id number]) [place]moreland, Marquite A. ([id number]) -------------------------------------------------------------------------------- Non-Wound Condition Assessment Details Patient Name: Kayla Keel A. Date of Service: 10/09/2017 9:45 AM Medical Record Number: [id number] Patient Account Number: [id number] Date of Birth/Sex: 1955-12-04 (62 y.o. F) Treating RN: Curtis Sites Primary Care Jonell Brumbaugh: [facility], Leavy Cella  Other Clinician: Referring Kripa Foskey:  Referral, Self Treating Jesiah Yerby/Extender: Kathreen Cosier in Treatment: 0 Non-Wound Condition: Condition: Rash / Dermatitis Location: Foot Side: Right Photos Periwound Skin Texture Texture Color No Abnormalities Noted: No No Abnormalities Noted: No Rash: Yes Erythema: Yes Erythema Location: Circumferential Moisture No Abnormalities Noted: No Temperature / Pain Temperature: No Abnormality Notes patient with redness and small pustules on her right plantar hindfoot and calcaneous Electronic Signature(s) Signed: 10/09/2017 5:06:29 PM By: Curtis Sites Entered By: Curtis Sites on 10/09/2017 11:04:35 Arroyo, Bary [name] ([id number]) -------------------------------------------------------------------------------- Pain Assessment Details Patient Name: Kayla Keel A. Date of Service: 10/09/2017 9:45 AM Medical Record Number: [id number] Patient Account Number: [id number] Date of Birth/Sex: [birth year] (62 y.o. F) Treating RN: Curtis Sites Primary Care Fernande Treiber: Leotis Shames Other Clinician: Referring Johnye Kist: Referral, Self Treating Emera Bussie/Extender: Kathreen Cosier in Treatment: 0 Active Problems Location of Pain Severity and Description of Pain Patient Has Paino Yes Site Locations Pain Location: Generalized Pain, Pain in Ulcers With Dressing Change: Yes Duration of the Pain. Constant / Intermittento Intermittent Pain Management and Medication Current Pain Management: Electronic Signature(s) Signed: 10/09/2017 5:06:29 PM By: Curtis Sites Entered By: Curtis Sites on 10/09/2017 10:23:40 Sarafian, Bary [name] ([id number]) -------------------------------------------------------------------------------- Patient/Caregiver Education Details Patient Name: Kayla Keel A. Date of Service: 10/09/2017 9:45 AM Medical Record Number: [id number] Patient Account Number: [id number] Date of Birth/Gender: 1955-12-23 (62 y.o. F) Treating RN: [name] Primary Care  Physician: Leotis Shames Other Clinician: Referring Physician: Referral, Self Treating Physician/Extender: Kathreen Cosier in Treatment: 0 Education Assessment Education Provided To: Patient Education Topics Provided Welcome To The Wound Care Center: Handouts: Welcome To The Wound Care Center Methods: Explain/Verbal Responses: State content correctly Wound/Skin Impairment: Handouts: Caring for Your Ulcer Methods: Explain/Verbal Responses: State content correctly Electronic Signature(s) Signed: 10/10/2017 10:58:55 AM By: [name] Entered By: [name] on 10/09/2017 11:20:59 Daidone, Bary [name] ([id number]) -------------------------------------------------------------------------------- Wound Assessment Details Patient Name: Kayla Keel A. Date of Service: 10/09/2017 9:45 AM Medical Record Number: [id number] Patient Account Number: [id number] Date of Birth/Sex: [birth year] (62 y.o. F) Treating RN: Curtis Sites Primary Care Mauriah Mcmillen: Leotis Shames Other Clinician: Referring [name] Raine: Referral, Self Treating Clydine Parkison/Extender: Kathreen Cosier in Treatment: 0 Wound Status Wound Number: 1 Primary Diabetic Wound/Ulcer of the Lower Extremity Etiology: Wound Location: Right Lower Leg - Anterior Wound Open Wounding Event: Trauma Status: Date Acquired: 09/25/2017 Comorbid Chronic Obstructive Pulmonary Disease Weeks Of Treatment: 0 History: (COPD), Hypertension, Type II Diabetes, Clustered Wound: No Neuropathy Photos Photo Uploaded By: Curtis Sites on 10/09/2017 11:03:05 Wound Measurements Length: (cm) 3.5 Width: (cm) 1 Depth: (cm) 0.1 Area: (cm) 2.749 Volume: (cm) 0.275 % Reduction in Area: % Reduction in Volume: Epithelialization: None Tunneling: No Undermining: No Wound Description Classification: Grade 1 Foul O Wound Margin: Flat and Intact Slough Exudate Amount: None Present dor After Cleansing: No /Fibrino No Wound Bed Granulation  Amount: None Present (0%) Exposed Structure Necrotic Amount: Large (67-100%) Fascia Exposed: No Necrotic Quality: Eschar Fat Layer (Subcutaneous Tissue) Exposed: No Tendon Exposed: No Muscle Exposed: No Joint Exposed: No Bone Exposed: No Periwound Skin Texture Texture Color No Abnormalities Noted: No No Abnormalities Noted: No Hlad, Karolyne A. ([id number]) Callus: No Atrophie Blanche: No Crepitus: No Cyanosis: No Excoriation: No Ecchymosis: No Induration: No Erythema: No Rash: No Hemosiderin Staining: No Scarring: No Mottled: No Pallor: No Moisture Rubor: No No Abnormalities Noted: No Dry / Scaly: No Temperature / Pain Maceration: No Temperature: No Abnormality Wound Preparation Ulcer Cleansing: Rinsed/Irrigated with Saline Topical Anesthetic Applied: Other: lidocaine 4%,  Treatment Notes Wound #1 (Right, Anterior Lower Leg) 1. Cleansed with: Clean wound with Normal Saline 2. Anesthetic Topical Lidocaine 4% cream to wound bed prior to debridement 4. Dressing Applied: Santyl Ointment 5. Secondary Dressing Applied Bordered Foam Dressing Dry Gauze Notes saline wet gauze over santyl then dry gaUZE Electronic Signature(s) Signed: 10/09/2017 5:06:29 PM By: Curtis Sitesorthy, Joanna Entered By: Curtis Sitesorthy, Joanna on 10/09/2017 10:37:27 Greenley, [name] ([id number]) -------------------------------------------------------------------------------- Vitals Details Patient Name: Kayla KeelAVIS, Kimiyo A. Date of Service: 10/09/2017 9:45 AM Medical Record Number: [id number] Patient Account Number: [id number] Date of Birth/Sex: 1955/08/26 (62 y.o. F) Treating RN: Curtis Sitesorthy, Joanna Primary Care Juell Radney: Leotis ShamesSINGH, JASMINE Other Clinician: Referring Tanea Moga: Referral, Self Treating Odeth Bry/Extender: Kathreen Cosieroulter, Leah Weeks in Treatment: 0 Vital Signs Time Taken: 10:23 Temperature (F): 98.2 Height (in): 61 Pulse (bpm): 72 Source: Measured Respiratory Rate (breaths/min): 16 Weight (lbs):  157 Blood Pressure (mmHg): 117/62 Source: Measured Reference Range: 80 - 120 mg / dl Body Mass Index (BMI): 29.7 Electronic Signature(s) Signed: 10/09/2017 5:06:29 PM By: Curtis Sitesorthy, Joanna Entered By: Curtis Sitesorthy, Joanna on 10/09/2017 10:27:36

## 2017-10-16 ENCOUNTER — Ambulatory Visit: Payer: Medicare Other | Admitting: Nurse Practitioner

## 2017-10-17 ENCOUNTER — Encounter: Payer: Medicare Other | Admitting: Physician Assistant

## 2017-10-17 DIAGNOSIS — E11622 Type 2 diabetes mellitus with other skin ulcer: Secondary | ICD-10-CM | POA: Diagnosis not present

## 2017-10-20 NOTE — Progress Notes (Signed)
LAQUIDA, COTRELL ([id number]) Visit Report for 10/17/2017 Chief Complaint Document Details Patient Name: [name], [name] Date of Service: 10/17/2017 11:00 AM Medical Record Number: [id number] Patient Account Number: [id number] Date of Birth/Sex: 05-25-1955 (62 y.o. F) Treating RN: Curtis Sites Primary Care Provider: Leotis Shames Other Clinician: Referring Provider: Leotis Shames Treating Provider/Extender: Linwood Dibbles, HOYT Weeks in Treatment: 1 Information Obtained from: Patient Chief Complaint Right pretibial wound Electronic Signature(s) Signed: 10/18/2017 1:32:06 AM By: Lenda Kelp PA-C Entered By: Lenda Kelp on 10/17/2017 10:39:13 [name], [name] ([id number]) -------------------------------------------------------------------------------- Debridement Details Patient Name: Kayla Keel A. Date of Service: 10/17/2017 11:00 AM Medical Record Number: [id number] Patient Account Number: [id number] Date of Birth/Sex: 01-23-1956 (62 y.o. F) Treating RN: Curtis Sites Primary Care Provider: Leotis Shames Other Clinician: Referring Provider: Leotis Shames Treating Provider/Extender: STONE III, HOYT Weeks in Treatment: 1 Debridement Performed for Wound #1 Right,Anterior Lower Leg Assessment: Performed By: Physician STONE III, HOYT E., PA-C Debridement Type: Debridement Severity of Tissue Pre Fat layer exposed Debridement: Pre-procedure Verification/Time Yes - 11:21 Out Taken: Start Time: 11:21 Pain Control: Lidocaine 4% Topical Solution Total Area Debrided (L x W): 0.3 (cm) x 1 (cm) = 0.3 (cm) Tissue and other material Viable, Eschar, Slough, Slough debrided: Level: Non-Viable Tissue Debridement Description: Selective/Open Wound Instrument: Blade, Curette Bleeding: Minimum Hemostasis Achieved: Pressure End Time: 11:24 Procedural Pain: 0 Post Procedural Pain: 0 Response to Treatment: Procedure was tolerated well Level of Consciousness: Awake and  Alert Post Debridement Measurements of Total Wound Length: (cm) 4.5 Width: (cm) 1 Depth: (cm) 0.1 Volume: (cm) 0.353 Character of Wound/Ulcer Post Debridement: Improved Severity of Tissue Post Debridement: Fat layer exposed Post Procedure Diagnosis Same as Pre-procedure Electronic Signature(s) Signed: 10/18/2017 1:32:06 AM By: Lenda Kelp PA-C Signed: 10/20/2017 2:22:32 PM By: Curtis Sites Entered By: Curtis Sites on 10/17/2017 11:23:46 [name], [name] ([id number]) -------------------------------------------------------------------------------- HPI Details Patient Name: Kayla Keel A. Date of Service: 10/17/2017 11:00 AM Medical Record Number: [id number] Patient Account Number: [id number] Date of Birth/Sex: June 27, 1955 (62 y.o. F) Treating RN: Curtis Sites Primary Care Provider: Leotis Shames Other Clinician: Referring Provider: Leotis Shames Treating Provider/Extender: Linwood Dibbles, HOYT Weeks in Treatment: 1 History of Present Illness HPI Description: 10/09/17-She presents today as an initial evaluation for a right pretibial ulcer. She states approximately 3 weeks ago she fell, lacerating the anterior aspect of her right lower shin. She states she was prescribed Levaquin which she completed last week. She states she has been applying steroid cream to this area with no noted improvement; is eschar covered. She denies pain to this area. 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 She states [place] hal[place] ointment with no noted improvement. She has not been applying an occlusive dressing after the application of steroid cream. She states there is no known exacerbating or relieving factors. She does complain of pain, pruritus and admits that presentation today is significantly better than they flareup  she had last week. 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 We have requested notes from dermatology and will review those. She does state she has had a biopsy to the right foot. Overall, she is a poor historian 10/17/17 on evaluation today the patient whom I have not seen previously but has been in our clinic appears to be doing fairly well at this point in time. She has  been tolerating the dressing changes without complication. With that being said I do not believe she's been using the amount of Santyl that is required in order to actually see the necrotic tissue softened at this point. Also think utilizing a Kerlex wrap as opposed to an inclusive Boarder Foam Dressing may be slowing things down because were not retaining enough moisture for the Santyl to be completely effective. With that being said I think both of these issues do need to be addressed at this point. In general however she still continues to have a lot of discomfort. Electronic Signature(s) Signed: 10/18/2017 1:32:06 AM By: Lenda Kelp PA-C Entered By: Lenda Kelp on 10/17/2017 23:59:30 [name], [name] ([id number]) -------------------------------------------------------------------------------- Physical Exam Details Patient Name: Kayla Keel A. Date of Service: 10/17/2017 11:00 AM Medical Record Number: [id number] Patient Account Number: [id number] Date of Birth/Sex: 1955/04/27 (62 y.o. F) Treating RN: Curtis Sites Primary Care Provider: Leotis Shames Other Clinician: Referring Provider: Leotis Shames Treating Provider/Extender: STONE III, HOYT Weeks in Treatment: 1 Constitutional Well-nourished and well-hydrated in no acute distress. Respiratory normal breathing without difficulty. Psychiatric this patient is able to make decisions and demonstrates good insight into disease process. Alert and Oriented x 3. pleasant and cooperative. Notes Patient's wound bed did show significant eschar which I  attempted to sharply debride. I was only able to do a very small portion of this unfortunately before I had to abort the procedure secondary to pain. I therefore after discussion with patient continued to crosshatch the eschar area in order to allow for the Santyl to penetrate and hopefully loosen this up in a much more efficient way. She tolerated this without any complication. Electronic Signature(s) Signed: 10/18/2017 1:32:06 AM By: Lenda Kelp PA-C Entered By: Lenda Kelp on 10/18/2017 00:00:11 [name], [name] ([id number]) -------------------------------------------------------------------------------- Physician Orders Details Patient Name: Kayla Keel A. Date of Service: 10/17/2017 11:00 AM Medical Record Number: [id number] Patient Account Number: [id number] Date of Birth/Sex: 05-05-1955 (62 y.o. F) Treating RN: Curtis Sites Primary Care Provider: Leotis Shames Other Clinician: Referring Provider: Leotis Shames Treating Provider/Extender: STONE III, HOYT Weeks in Treatment: 1 Verbal / Phone Orders: No Diagnosis Coding ICD-10 Coding Code Description [phone] Non-pressure chronic ulcer of right calf with fat layer exposed L40.9 Psoriasis, unspecified Wound Cleansing Wound #1 Right,Anterior Lower Leg o Clean wound with Normal Saline. o Cleanse wound with mild soap and water Wound #2 Left,Medial Lower Leg o Clean wound with Normal Saline. o Cleanse wound with mild soap and water Anesthetic (add to Medication List) Wound #1 Right,Anterior Lower Leg o Topical Lidocaine 4% cream applied to wound bed prior to debridement (In Clinic Only). Wound #2 Left,Medial Lower Leg o Topical Lidocaine 4% cream applied to wound bed prior to debridement (In Clinic Only). Primary Wound Dressing Wound #1 Right,Anterior Lower Leg o Saline moistened gauze o Santyl Ointment Wound #2 Left,Medial Lower Leg o Saline moistened gauze o Santyl Ointment Secondary  Dressing Wound #1 Right,Anterior Lower Leg o Dry Gauze o [place] Wound #2 Left,Medial Lower Leg o Dry Gauze o [place] Dressing Change Frequency Wound #1 Right,Anterior Lower Leg o Change dressing every day. LEE-ANNE, FLICKER AMarland Kitchen ([id number]) Wound #2 Left,Medial Lower Leg o Change dressing every day. Edema Control Wound #1 Right,Anterior Lower Leg o Elevate legs to the level of the heart and pump ankles as often as possible Wound #2 Left,Medial Lower Leg o Elevate legs to the level of the heart and pump ankles as often as possible Additional Orders /  Instructions Wound #1 Right,Anterior Lower Leg o Increase protein intake. o Activity as tolerated Wound #2 Left,Medial Lower Leg o Increase protein intake. o Activity as tolerated Electronic Signature(s) Signed: 10/18/2017 1:32:06 AM By: Lenda Kelp PA-C Signed: 10/20/2017 2:22:32 PM By: Curtis Sites Entered By: Curtis Sites on 10/17/2017 11:24:39 [name], [name] ([id number]) -------------------------------------------------------------------------------- Problem List Details Patient Name: Kayla Keel A. Date of Service: 10/17/2017 11:00 AM Medical Record Number: [id number] Patient Account Number: [id number] Date of Birth/Sex: 1956/02/06 (62 y.o. F) Treating RN: Curtis Sites Primary Care Provider: Leotis Shames Other Clinician: Referring Provider: Leotis Shames Treating Provider/Extender: Linwood Dibbles, HOYT Weeks in Treatment: 1 Active Problems ICD-10 Evaluated Encounter Code Description Active Date Today Diagnosis L97.212 Non-pressure chronic ulcer of right calf with fat layer exposed 10/09/2017 No Yes L40.9 Psoriasis, unspecified 10/09/2017 No Yes Inactive Problems Resolved Problems Electronic Signature(s) Signed: 10/18/2017 1:32:06 AM By: Lenda Kelp PA-C Entered By: Lenda Kelp on 10/17/2017 10:39:07 [name], [name]  ([id number]) -------------------------------------------------------------------------------- Progress Note Details Patient Name: Kayla Keel A. Date of Service: 10/17/2017 11:00 AM Medical Record Number: [id number] Patient Account Number: [id number] Date of Birth/Sex: 1955/09/03 (62 y.o. F) Treating RN: Curtis Sites Primary Care Provider: Leotis Shames Other Clinician: Referring Provider: Leotis Shames Treating Provider/Extender: Linwood Dibbles, HOYT Weeks in Treatment: 1 Subjective Chief Complaint Information obtained from Patient Right pretibial wound History of Present Illness (HPI) 10/09/17-She presents today as an initial evaluation for a right pretibial ulcer. She states approximately 3 weeks ago she fell, lacerating the anterior aspect of her right lower shin. She states she was prescribed Levaquin which she completed last week. She states she has been applying steroid cream to this area with no noted improvement; is eschar covered. She denies pain to this area. 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 She states she [place] you [place] halobet[place]ol ointment with no noted improvement. She has not been applying an occlusive dressing after the application of steroid cream. She states there is no known exacerbating or relieving factors. She does complain of pain, pruritus and admits that presentation today is significantly better than they flareup she had last week. 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 We have requested notes from dermatology and will review those. She does state she has had a biopsy to the right foot. Overall, she is a poor historian 10/17/17 on evaluation today the patient whom I have not seen previously but has been in our clinic appears to be doing fairly well at this point in time. She has been  tolerating the dressing changes without complication. With that being said I do not believe she's been using the amount of Santyl that is required in order to actually see the necrotic tissue softened at this point. Also think utilizing a Kerlex wrap as opposed to an inclusive Boarder Foam Dressing may be slowing things down because were not retaining enough moisture for the Santyl to be completely effective. With that being said I think both of these issues do need to be addressed at this point. In general however she still continues to have a lot of discomfort. Patient History Information obtained from Patient. Family History Cancer - Mother, Heart Disease - Father, Lung Disease - Mother, No family history of Diabetes, Hereditary Spherocytosis, Hypertension, Kidney Disease, Seizures, Stroke, Thyroid Problems, Tuberculosis. Social History Former smoker, Marital Status - Widowed,  Alcohol Use - Rarely, Drug Use - No History, Caffeine Use - Moderate. 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 Respiratory Stokes, Zoye A. ([id number]) The patient has no complaints or symptoms. Cardiovascular The patient has no complaints or symptoms. Psychiatric The patient has no complaints or symptoms. Objective Constitutional Well-nourished and well-hydrated in no acute distress. Vitals Time Taken: 10:45 AM, Height: 61 in, Weight: 157 lbs, BMI: 29.7, Temperature: 98.2 F, Pulse: 80 bpm, Respiratory Rate: 16 breaths/min, Blood Pressure: 106/73 mmHg. Respiratory normal breathing without difficulty. Psychiatric this patient is able to make decisions and demonstrates good insight into disease process. Alert and Oriented x 3. pleasant and cooperative. General Notes: Patient's wound bed did show significant eschar which  I attempted to sharply debride. I was only able to do a very small portion of this unfortunately before I had to abort the procedure secondary to pain. I therefore after discussion with patient continued to crosshatch the eschar area in order to allow for the Santyl to penetrate and hopefully loosen this up in a much more efficient way. She tolerated this without any complication. Integumentary (Hair, Skin) Wound #1 status is Open. Original cause of wound was Trauma. The wound is located on the Right,Anterior Lower Leg. The wound measures 4.5cm length x 1cm width x 0.1cm depth; 3.534cm^2 area and 0.353cm^3 volume. There is no tunneling or undermining noted. There is a small amount of sanguinous drainage noted. The wound margin is flat and intact. There is no granulation within the wound bed. There is a large (67-100%) amount of necrotic tissue within the wound bed including Eschar. The periwound skin appearance did not exhibit: Callus, Crepitus, Excoriation, Induration, Rash, Scarring, Dry/Scaly, Maceration, Atrophie Blanche, Cyanosis, Ecchymosis, Hemosiderin Staining, Mottled, Pallor, Rubor, Erythema. Periwound temperature was noted as No Abnormality. General Notes: black necrotic on wound Wound #2 status is Open. Original cause of wound was Gradually Appeared. The wound is located on the Left,Medial Lower Leg. The wound measures 1.5cm length x 0.8cm width x 0.1cm depth; 0.942cm^2 area and 0.094cm^3 volume. There is Fat Layer (Subcutaneous Tissue) Exposed exposed. There is no tunneling or undermining noted. There is a medium amount of serosanguineous drainage noted. The wound margin is distinct with the outline attached to the wound base. There is no granulation within the wound bed. There is a large (67-100%) amount of necrotic tissue within the wound bed including Adherent Slough. The periwound skin appearance did not exhibit: Callus, Crepitus, Excoriation, Induration, Rash,  Scarring, Dry/Scaly, Maceration, Atrophie Blanche, Cyanosis, Ecchymosis, Hemosiderin Staining, Mottled, Pallor, Rubor, Erythema. Other Condition(s) Patient presents with Rash / Dermatitis located on the Right Foot. The skin appearance exhibited: Dry/Scaly, Erythema, Maceration, Rash. Skin temperature was noted as No Abnormality. General Notes: blistered areas noted on arch area of foot Karman, Tahani A. 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 There was a Selective/Open Wound Non-Viable Tissue Debridement with a total area of 0.3 sq cm performed by STONE III, HOYT E., PA-C. With the following instrument(s): Blade, and Curette to remove Viable tissue/material. Material removed includes Eschar and Slough and after achieving pain control using Lidocaine 4% Topical Solution. No specimens were  taken. A time out was conducted at 11:21, prior to the start of the procedure. A Minimum amount of bleeding was controlled with Pressure. The procedure was tolerated well with a pain level of 0 throughout and a pain level of 0 following the procedure. Patient s Level of Consciousness post procedure was recorded as Awake and Alert. Post Debridement Measurements: 4.5cm length x 1cm width x 0.1cm depth; 0.353cm^3 volume. Character of Wound/Ulcer Post Debridement is improved. Severity of Tissue Post Debridement is: Fat layer exposed. Post procedure Diagnosis Wound #1: Same as Pre-Procedure Plan Wound Cleansing: Wound #1 Right,Anterior Lower Leg: Clean wound with Normal Saline. Cleanse wound with mild soap and water Wound #2 Left,Medial Lower Leg: Clean wound with Normal Saline. Cleanse wound with mild soap and  water Anesthetic (add to Medication List): Wound #1 Right,Anterior Lower Leg: Topical Lidocaine 4% cream applied to wound bed prior to debridement (In Clinic Only). Wound #2 Left,Medial Lower Leg: Topical Lidocaine 4% cream applied to wound bed prior to debridement (In Clinic Only). Primary Wound Dressing: Wound #1 Right,Anterior Lower Leg: Saline moistened gauze Santyl Ointment Wound #2 Left,Medial Lower Leg: Saline moistened gauze Santyl Ointment Secondary Dressing: Wound #1 Right,Anterior Lower Leg: Dry Gauze [name], [name] ([id number]) [place] Wound #2 Left,Medial Lower Leg: Dry Gauze [place] Dressing Change Frequency: Wound #1 Right,Anterior Lower Leg: Change dressing every day. Wound #2 Left,Medial Lower Leg: Change dressing every day. Edema Control: Wound #1 Right,Anterior Lower Leg: Elevate legs to the level of the heart and pump ankles as often as possible Wound #2 Left,Medial Lower Leg: Elevate legs to the level of the heart and pump ankles as often as possible Additional Orders / Instructions: Wound #1 Right,Anterior Lower Leg: Increase protein intake. Activity as tolerated Wound #2 Left,Medial Lower Leg: Increase protein intake. Activity as tolerated I am going to suggest currently that we continue with the above wound care orders for the next week. The patient is in agreement the plan. We will subsequently actually see her back for reevaluation in two weeks time. Please see above for specific wound care orders. We will see patient for re-evaluation in 2 week(s) here in the clinic. If anything worsens or changes patient will contact our office for additional recommendations. Electronic Signature(s) Signed: 10/18/2017 1:32:06 AM By: Lenda Kelp PA-C Entered By: Lenda Kelp on 10/18/2017 00:00:51 [name], [name] ([id number]) -------------------------------------------------------------------------------- ROS/PFSH Details Patient Name:  Kayla Keel A. Date of Service: 10/17/2017 11:00 AM Medical Record Number: [id number] Patient Account Number: [id number] Date of Birth/Sex: [birth year] (62 y.o. F) Treating RN: Curtis Sites Primary Care Provider: Leotis Shames Other Clinician: Referring Provider: Leotis Shames Treating Provider/Extender: STONE III, HOYT Weeks in Treatment: 1 Information Obtained From Patient Wound History Do you currently have one or more open woundso Yes How many open wounds do you currently haveo 1 Approximately how long have you had your woundso 3 weeks How have you been treating your wound(s) until nowo bandaid Has your wound(s) ever healed and then re-openedo No Have you had any lab work done in the past montho No Have you tested positive for an antibiotic resistant organism (MRSA, VRE)o No Have you tested positive for osteomyelitis (bone infection)o No Have you had any tests for circulation on your legso No Constitutional Symptoms (General Health) Complaints and Symptoms: Negative for: Fever; Chills Eyes Medical History: Negative for: Cataracts; Glaucoma; Optic Neuritis Ear/Nose/Mouth/Throat Medical History: Negative for: Chronic sinus problems/congestion; Middle ear problems Hematologic/Lymphatic Medical History: Negative for: Anemia; Hemophilia; Human Immunodeficiency  Virus; Lymphedema; Sickle Cell Disease Respiratory Complaints and Symptoms: No Complaints or Symptoms Medical History: Positive for: Chronic Obstructive Pulmonary Disease (COPD) - chronic bronchitis Negative for: Aspiration; Asthma; Pneumothorax; Sleep Apnea; Tuberculosis Cardiovascular Complaints and Symptoms: No Complaints or Symptoms Medical History: Positive for: Hypertension Negative for: Angina; Arrhythmia; Congestive Heart Failure; Coronary Artery Disease; Deep Vein Thrombosis; Hypotension; Milone, Ertha A. ([id number]) Myocardial Infarction; Peripheral Arterial Disease; Peripheral Venous Disease;  Phlebitis; Vasculitis Past Medical History Notes: hypercholesteremia Gastrointestinal Medical History: Negative for: Cirrhosis ; Colitis; Crohnos; Hepatitis A; Hepatitis B; Hepatitis C Endocrine Medical History: Positive for: Type II Diabetes Negative for: Type I Diabetes Genitourinary Medical History: Negative for: End Stage Renal Disease Immunological Medical History: Negative for: Lupus Erythematosus; Raynaudos; Scleroderma Integumentary (Skin) Medical History: Negative for: History of Burn; History of pressure wounds Musculoskeletal Medical History: Negative for: Gout; Rheumatoid Arthritis; Osteoarthritis; Osteomyelitis Neurologic Medical History: Positive for: Neuropathy Negative for: Dementia; Quadriplegia; Paraplegia; Seizure Disorder Past Medical History Notes: CVA in history but patient denies Oncologic Medical History: Negative for: Received Chemotherapy; Received Radiation Psychiatric Complaints and Symptoms: No Complaints or Symptoms Medical History: Negative for: Anorexia/bulimia; Confinement Anxiety Immunizations Pneumococcal Vaccine: Flo ShanksDAVIS, [name] ([id number]) Received Pneumococcal Vaccination: Yes Implantable Devices Family and Social History Cancer: Yes - Mother; Diabetes: No; Heart Disease: Yes - Father; Hereditary Spherocytosis: No; Hypertension: No; Kidney Disease: No; Lung Disease: Yes - Mother; Seizures: No; Stroke: No; Thyroid Problems: No; Tuberculosis: No; Former smoker; Marital Status - Widowed; Alcohol Use: Rarely; Drug Use: No History; Caffeine Use: Moderate; Financial Concerns: No; Food, Clothing or Shelter Needs: No; Support System Lacking: No; Transportation Concerns: No; Advanced Directives: No; Patient does not want information on Advanced Directives Physician Affirmation I have reviewed and agree with the above information. Electronic Signature(s) Signed: 10/18/2017 1:32:06 AM By: Lenda KelpStone III, Hoyt PA-C Signed: 10/20/2017 2:22:32 PM  By: Curtis Sitesorthy, Joanna Entered By: Lenda KelpStone III, Hoyt on 10/17/2017 23:59:52 [name], Kayla CastillaEBORAH A. ([id number]) -------------------------------------------------------------------------------- SuperBill Details Patient Name: Kayla KeelAVIS, Akshaya A. Date of Service: 10/17/2017 Medical Record Number: [id number] Patient Account Number: [id number] Date of Birth/Sex: 1956/03/13 (62 y.o. F) Treating RN: Curtis Sitesorthy, Joanna Primary Care Provider: Leotis ShamesSINGH, JASMINE Other Clinician: Referring Provider: Leotis ShamesSINGH, JASMINE Treating Provider/Extender: STONE III, HOYT Weeks in Treatment: 1 Diagnosis Coding ICD-10 Codes Code Description [phone] Non-pressure chronic ulcer of right calf with fat layer exposed L40.9 Psoriasis, unspecified Facility Procedures CPT4 Code: [id number] Description: 97597 - DEBRIDE WOUND 1ST 20 SQ CM OR < ICD-10 Diagnosis Description L97.212 Non-pressure chronic ulcer of right calf with fat layer expo Modifier: sed Quantity: 1 Physician Procedures CPT4 Code: [id number] Description: 97597 - WC PHYS DEBR WO ANESTH 20 SQ CM ICD-10 Diagnosis Description 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

## 2017-10-21 NOTE — Progress Notes (Signed)
DERA, VANAKEN ([id number]) Visit Report for 10/17/2017 Arrival Information Details Patient Name: [name], [name] Date of Service: 10/17/2017 11:00 AM Medical Record Number: [id number] Patient Account Number: [id number] Date of Birth/Sex: [birth year] (62 y.o. F) Treating RN: [name] Primary Care Garner Dullea: Leotis Shames Other Clinician: Referring Fernando Torry: Leotis Shames Treating Willard Farquharson/Extender: STONE III, HOYT Weeks in Treatment: 1 Visit Information History Since Last Visit All ordered tests and consults were completed: No Patient Arrived: Ambulatory Added or deleted any medications: No Arrival Time: 10:43 Any new allergies or adverse reactions: No Accompanied By: self Had a fall or experienced change in No Transfer Assistance: None activities of daily living that may affect Patient Identification Verified: Yes risk of falls: Secondary Verification Process Completed: Yes Signs or symptoms of abuse/neglect since last visito No Patient Has Alerts: Yes Hospitalized since last visit: No Patient Alerts: DMII Implantable device outside of the clinic excluding No cellular tissue based products placed in the center since last visit: Pain Present Now: Yes Electronic Signature(s) Signed: 10/17/2017 4:43:15 PM By: [name] Entered By: [name] on 10/17/2017 10:44:01 Harkey, [name] ([id number]) -------------------------------------------------------------------------------- Encounter Discharge Information Details Patient Name: Kayla Keel A. Date of Service: 10/17/2017 11:00 AM Medical Record Number: [id number] Patient Account Number: [id number] Date of Birth/Sex: 30-Jul-1955 (62 y.o. F) Treating RN: Phillis Haggis Primary Care Lindzy Rupert: Leotis Shames Other Clinician: Referring Ezekeil Bethel: Leotis Shames Treating Kaetlin Bullen/Extender: STONE III, HOYT Weeks in Treatment: 1 Encounter Discharge Information Items Discharge Condition: Stable Ambulatory  Status: Ambulatory Discharge Destination: Home Transportation: Private Auto Accompanied By: self Schedule Follow-up Appointment: Yes Clinical Summary of Care: Electronic Signature(s) Signed: 10/17/2017 11:53:30 AM By: Alejandro Mulling Entered By: Alejandro Mulling on 10/17/2017 11:53:29 Odom, [name] ([id number]) -------------------------------------------------------------------------------- Lower Extremity Assessment Details Patient Name: Kayla Keel A. Date of Service: 10/17/2017 11:00 AM Medical Record Number: [id number] Patient Account Number: [id number] Date of Birth/Sex: [birth year] (62 y.o. F) Treating RN: [name] Primary Care Damon Hargrove: Leotis Shames Other Clinician: Referring Brandonn Capelli: Leotis Shames Treating Labradford Schnitker/Extender: STONE III, HOYT Weeks in Treatment: 1 Edema Assessment Assessed: [Left: No] [Right: No] Edema: [Left: No] [Right: No] Vascular Assessment Claudication: Claudication Assessment [Left:None] [Right:None] Pulses: Dorsalis Pedis Palpable: [Left:Yes] [Right:Yes] Posterior Tibial Extremity colors, hair growth, and conditions: Extremity Color: [Left:Normal] [Right:Normal] Hair Growth on Extremity: [Left:Yes] [Right:Yes] Temperature of Extremity: [Left:Warm] [Right:Warm] Capillary Refill: [Left:< 3 seconds] [Right:< 3 seconds] Toe Nail Assessment Left: Right: Thick: No No Discolored: No No Deformed: No No Improper Length and Hygiene: No No Electronic Signature(s) Signed: 10/17/2017 4:43:15 PM By: [name] Entered By: [name] on 10/17/2017 10:55:07 Willetts, Youlanda A. ([id number]) -------------------------------------------------------------------------------- Multi Wound Chart Details Patient Name: Kayla Keel A. Date of Service: 10/17/2017 11:00 AM Medical Record Number: [id number] Patient Account Number: [id number] Date of Birth/Sex: 01-04-1956 (62 y.o. F) Treating RN: Curtis Sites Primary Care Carine Nordgren: Leotis Shames Other Clinician: Referring Digby Groeneveld: Leotis Shames Treating Elye Harmsen/Extender: STONE III, HOYT Weeks in Treatment: 1 Vital Signs Height(in): 61 Pulse(bpm): 80 Weight(lbs): 157 Blood Pressure(mmHg): 106/73 Body Mass Index(BMI): 30 Temperature(F): 98.2 Respiratory Rate 16 (breaths/min): Photos: [1:No Photos] [2:No Photos] [N/A:N/A] Wound Location: [1:Right Lower Leg - Anterior] [2:Left Lower Leg - Medial] [N/A:N/A] Wounding Event: [1:Trauma] [2:Gradually Appeared] [N/A:N/A] Primary Etiology: [1:Diabetic Wound/Ulcer of the Lower Extremity] [2:Diabetic Wound/Ulcer of the Lower Extremity] [N/A:N/A] Comorbid History: [1:Chronic Obstructive Pulmonary Disease (COPD), Hypertension, Type II Diabetes, Neuropathy] [2:Chronic Obstructive Pulmonary Disease (COPD), Hypertension, Type II Diabetes, Neuropathy] [N/A:N/A] Date Acquired: [1:09/25/2017] [2:10/10/2017] [N/A:N/A] Weeks of Treatment: [1:1] [2:0] [N/A:N/A] Wound Status: [1:Open] [2:Open] [  N/A:N/A] Measurements L x W x D [1:4.5x1x0.1] [2:1.5x0.8x0.1] [N/A:N/A] (cm) Area (cm) : [1:3.534] [2:0.942] [N/A:N/A] Volume (cm) : [1:0.353] [2:0.094] [N/A:N/A] % Reduction in Area: [1:-28.60%] [2:0.00%] [N/A:N/A] % Reduction in Volume: [1:-28.40%] [2:0.00%] [N/A:N/A] Classification: [1:Grade 1] [2:Grade 2] [N/A:N/A] Exudate Amount: [1:Small] [2:Medium] [N/A:N/A] Exudate Type: [1:Sanguinous] [2:Serosanguineous] [N/A:N/A] Exudate Color: [1:red] [2:red, brown] [N/A:N/A] Wound Margin: [1:Flat and Intact] [2:Distinct, outline attached] [N/A:N/A] Granulation Amount: [1:None Present (0%)] [2:None Present (0%)] [N/A:N/A] Necrotic Amount: [1:Large (67-100%)] [2:Large (67-100%)] [N/A:N/A] Necrotic Tissue: [1:Eschar] [2:Adherent Slough] [N/A:N/A] Exposed Structures: [1:Fascia: No Fat Layer (Subcutaneous Tissue) Exposed: No Tendon: No Muscle: No Joint: No Bone: No] [2:Fat Layer (Subcutaneous Tissue) Exposed: Yes Fascia: No Tendon: No Muscle: No  Joint: No Bone: No] [N/A:N/A] Epithelialization: [1:None] [2:None] [N/A:N/A] Periwound Skin Texture: [1:Excoriation: No Induration: No Callus: No] [2:Excoriation: No Induration: No Callus: No] [N/A:N/A] Crepitus: No Crepitus: No Rash: No Rash: No Scarring: No Scarring: No Periwound Skin Moisture: Maceration: No Maceration: No N/A Dry/Scaly: No Dry/Scaly: No Periwound Skin Color: Atrophie Blanche: No Atrophie Blanche: No N/A Cyanosis: No Cyanosis: No Ecchymosis: No Ecchymosis: No Erythema: No Erythema: No Hemosiderin Staining: No Hemosiderin Staining: No Mottled: No Mottled: No Pallor: No Pallor: No Rubor: No Rubor: No Temperature: No Abnormality N/A N/A Tenderness on Palpation: No No N/A Wound Preparation: Ulcer Cleansing: Ulcer Cleansing: N/A Rinsed/Irrigated with Saline Rinsed/Irrigated with Saline Topical Anesthetic Applied: Topical Anesthetic Applied: Other: lidocaine 4% Other: lidocaine 4% Assessment Notes: black necrotic on wound N/A N/A Treatment Notes Electronic Signature(s) Signed: 10/20/2017 2:22:32 PM By: Curtis Sites Entered By: Curtis Sites on 10/17/2017 11:19:49 Wolk, [name] ([id number]) -------------------------------------------------------------------------------- Multi-Disciplinary Care Plan Details Patient Name: Kayla Keel A. Date of Service: 10/17/2017 11:00 AM Medical Record Number: [id number] Patient Account Number: [id number] Date of Birth/Sex: [birth year] (62 y.o. F) Treating RN: Curtis Sites Primary Care Jaicee Michelotti: Leotis Shames Other Clinician: Referring Alexya Mcdaris: Leotis Shames Treating Rateel Beldin/Extender: STONE III, HOYT Weeks in Treatment: 1 Active Inactive ` Abuse / Safety / Falls / Self Care Management Nursing Diagnoses: Potential for falls Goals: Patient will not experience any injury related to falls Date Initiated: 10/09/2017 Target Resolution Date: 02/07/2018 Goal Status: Active Interventions: Assess  Activities of Daily Living upon admission and as needed Assess fall risk on admission and as needed Assess: immobility, friction, shearing, incontinence upon admission and as needed Notes: ` Nutrition Nursing Diagnoses: Imbalanced nutrition Impaired glucose control: actual or potential Potential for alteratiion in Nutrition/Potential for imbalanced nutrition Goals: Patient/caregiver agrees to and verbalizes understanding of need to use nutritional supplements and/or vitamins as prescribed Date Initiated: 10/09/2017 Target Resolution Date: 01/03/2018 Goal Status: Active Patient/caregiver will maintain therapeutic glucose control Date Initiated: 10/09/2017 Target Resolution Date: 02/07/2018 Goal Status: Active Interventions: Assess patient nutrition upon admission and as needed per policy Provide education on elevated blood sugars and impact on wound healing Provide education on nutrition Notes: ` Orientation to the Wound Care Program JAYLAH, GOODLOW ([id number]) Nursing Diagnoses: Knowledge deficit related to the wound healing center program Goals: Patient/caregiver will verbalize understanding of the Wound Healing Center Program Date Initiated: 10/09/2017 Target Resolution Date: 11/08/2017 Goal Status: Active Interventions: Provide education on orientation to the wound center Notes: ` Pain, Acute or Chronic Nursing Diagnoses: Pain, acute or chronic: actual or potential Potential alteration in comfort, pain Goals: Patient/caregiver will verbalize adequate pain control between visits Date Initiated: 10/09/2017 Target Resolution Date: 01/03/2018 Goal Status: Active Interventions: Complete pain assessment as per visit requirements Encourage patient to take pain medications as prescribed Notes: ` Wound/Skin Impairment  Nursing Diagnoses: Impaired tissue integrity Knowledge deficit related to ulceration/compromised skin integrity Goals: Ulcer/skin breakdown will have a  volume reduction of 80% by week 12 Date Initiated: 10/09/2017 Target Resolution Date: 01/03/2018 Goal Status: Active Interventions: Assess patient/caregiver ability to perform ulcer/skin care regimen upon admission and as needed Assess ulceration(s) every visit Notes: Electronic Signature(s) Signed: 10/20/2017 2:22:32 PM By: Curtis Sites Entered By: Curtis Sites on 10/17/2017 11:19:28 Redmann, [name] ([id number]) Shuford, Kourtnee A. ([id number]) -------------------------------------------------------------------------------- Non-Wound Condition Assessment Details Patient Name: Kayla Keel A. Date of Service: 10/17/2017 11:00 AM Medical Record Number: [id number] Patient Account Number: [id number] Date of Birth/Sex: 19-Feb-1956 (62 y.o. F) Treating RN: [name] Primary Care Abbigayle Toole: Leotis Shames Other Clinician: Referring Matisse Salais: Leotis Shames Treating Brita Jurgensen/Extender: STONE III, HOYT Weeks in Treatment: 1 Non-Wound Condition: Condition: Rash / Dermatitis Location: Foot Side: Right Photos Periwound Skin Texture Texture Color No Abnormalities Noted: No No Abnormalities Noted: No Rash: Yes Erythema: Yes Erythema Location: Circumferential Moisture No Abnormalities Noted: No Temperature / Pain Dry / Scaly: Yes Temperature: No Abnormality Maceration: Yes Notes blistered areas noted on arch area of foot Electronic Signature(s) Signed: 10/17/2017 4:43:15 PM By: [name] Entered By: [name] on 10/17/2017 16:34:54 Mcgurn, [name] ([id number]) -------------------------------------------------------------------------------- Pain Assessment Details Patient Name: Kayla Keel A. Date of Service: 10/17/2017 11:00 AM Medical Record Number: [id number] Patient Account Number: [id number] Date of Birth/Sex: 07-Feb-1956 (62 y.o. F) Treating RN: [name] Primary Care Jillian Warth: Leotis Shames Other Clinician: Referring Elric Tirado: Leotis Shames Treating Shiheem Corporan/Extender: STONE III, HOYT Weeks in Treatment: 1 Active Problems Location of Pain Severity and Description of Pain Patient Has Paino Yes Site Locations Pain Location: Pain in Ulcers Duration of the Pain. Constant / Intermittento Constant Rate the pain. Current Pain Level: 7 Character of Pain Describe the Pain: Aching Pain Management and Medication Current Pain Management: Electronic Signature(s) Signed: 10/17/2017 4:43:15 PM By: [name] Entered By: [name] on 10/17/2017 10:45:33 Hansen, [name] ([id number]) -------------------------------------------------------------------------------- Patient/Caregiver Education Details Patient Name: Kayla Keel A. Date of Service: 10/17/2017 11:00 AM Medical Record Number: [id number] Patient Account Number: [id number] Date of Birth/Gender: March 24, 1956 (62 y.o. F) Treating RN: Phillis Haggis Primary Care Physician: Leotis Shames Other Clinician: Referring Physician: Leotis Shames Treating Physician/Extender: Linwood Dibbles, HOYT Weeks in Treatment: 1 Education Assessment Education Provided To: Patient Education Topics Provided Wound/Skin Impairment: Handouts: Caring for Your Ulcer, Skin Care Do's and Dont's, Other: change dressing as ordered Methods: Demonstration, Explain/Verbal Responses: State content correctly Electronic Signature(s) Signed: 10/20/2017 5:20:31 PM By: Alejandro Mulling Entered By: Alejandro Mulling on 10/17/2017 11:53:46 Bahe, [name] ([id number]) -------------------------------------------------------------------------------- Wound Assessment Details Patient Name: Kayla Keel A. Date of Service: 10/17/2017 11:00 AM Medical Record Number: [id number] Patient Account Number: [id number] Date of Birth/Sex: 09/08/1955 (62 y.o. F) Treating RN: [name] Primary Care Biana Haggar: Leotis Shames Other Clinician: Referring Tiesha Marich: Leotis Shames Treating  Naysa Puskas/Extender: STONE III, HOYT Weeks in Treatment: 1 Wound Status Wound Number: 1 Primary Diabetic Wound/Ulcer of the Lower Extremity Etiology: Wound Location: Right Lower Leg - Anterior Wound Open Wounding Event: Trauma Status: Date Acquired: 09/25/2017 Comorbid Chronic Obstructive Pulmonary Disease Weeks Of Treatment: 1 History: (COPD), Hypertension, Type II Diabetes, Clustered Wound: No Neuropathy Photos Photo Uploaded By: [name] on 10/17/2017 16:20:19 Wound Measurements Length: (cm) 4.5 Width: (cm) 1 Depth: (cm) 0.1 Area: (cm) 3.534 Volume: (cm) 0.353 % Reduction in Area: -28.6% % Reduction in Volume: -28.4% Epithelialization: None Tunneling: No Undermining: No Wound Description Classification: Grade 1 Wound Margin: Flat and Intact Exudate Amount:  Small Exudate Type: Sanguinous Exudate Color: red Foul Odor After Cleansing: No Slough/Fibrino Yes Wound Bed Granulation Amount: None Present (0%) Exposed Structure Necrotic Amount: Large (67-100%) Fascia Exposed: No Necrotic Quality: Eschar Fat Layer (Subcutaneous Tissue) Exposed: No Tendon Exposed: No Muscle Exposed: No Joint Exposed: No Bone Exposed: No Periwound Skin Texture Haas, Shelbi A. ([id number]) Texture Color No Abnormalities Noted: No No Abnormalities Noted: No Callus: No Atrophie Blanche: No Crepitus: No Cyanosis: No Excoriation: No Ecchymosis: No Induration: No Erythema: No Rash: No Hemosiderin Staining: No Scarring: No Mottled: No Pallor: No Moisture Rubor: No No Abnormalities Noted: No Dry / Scaly: No Temperature / Pain Maceration: No Temperature: No Abnormality Wound Preparation Ulcer Cleansing: Rinsed/Irrigated with Saline Topical Anesthetic Applied: Other: lidocaine 4%, Assessment Notes black necrotic on wound Treatment Notes Wound #1 (Right, Anterior Lower Leg) 1. Cleansed with: Clean wound with Normal Saline 2. Anesthetic Topical Lidocaine 4% cream  to wound bed prior to debridement 4. Dressing Applied: Santyl Ointment Saline moistened guaze 5. Secondary Dressing Applied Dry Gauze [place] Electronic Signature(s) Signed: 10/17/2017 4:43:15 PM By: [name] Entered By: [name] on 10/17/2017 10:52:18 Mehlhaff, [name] ([id number]) -------------------------------------------------------------------------------- Wound Assessment Details Patient Name: Kayla Keel A. Date of Service: 10/17/2017 11:00 AM Medical Record Number: [id number] Patient Account Number: [id number] Date of Birth/Sex: 13-Sep-1955 (62 y.o. F) Treating RN: [name] Primary Care Zarayah Lanting: Leotis Shames Other Clinician: Referring Jadelin Eng: Leotis Shames Treating Xiamara Hulet/Extender: STONE III, HOYT Weeks in Treatment: 1 Wound Status Wound Number: 2 Primary Diabetic Wound/Ulcer of the Lower Extremity Etiology: Wound Location: Left Lower Leg - Medial Wound Open Wounding Event: Gradually Appeared Status: Date Acquired: 10/10/2017 Comorbid Chronic Obstructive Pulmonary Disease Weeks Of Treatment: 0 History: (COPD), Hypertension, Type II Diabetes, Clustered Wound: No Neuropathy Photos Photo Uploaded By: [name] on 10/17/2017 16:20:00 Wound Measurements Length: (cm) 1.5 Width: (cm) 0.8 Depth: (cm) 0.1 Area: (cm) 0.942 Volume: (cm) 0.094 % Reduction in Area: 0% % Reduction in Volume: 0% Epithelialization: None Tunneling: No Undermining: No Wound Description Classification: Grade 2 Wound Margin: Distinct, outline attached Exudate Amount: Medium Exudate Type: Serosanguineous Exudate Color: red, brown Foul Odor After Cleansing: No Slough/Fibrino Yes Wound Bed Granulation Amount: None Present (0%) Exposed Structure Necrotic Amount: Large (67-100%) Fascia Exposed: No Necrotic Quality: Adherent Slough Fat Layer (Subcutaneous Tissue) Exposed: Yes Tendon Exposed: No Muscle Exposed: No Joint Exposed: No Bone  Exposed: No Periwound Skin Texture Vinluan, Lovelle A. ([id number]) Texture Color No Abnormalities Noted: No No Abnormalities Noted: No Callus: No Atrophie Blanche: No Crepitus: No Cyanosis: No Excoriation: No Ecchymosis: No Induration: No Erythema: No Rash: No Hemosiderin Staining: No Scarring: No Mottled: No Pallor: No Moisture Rubor: No No Abnormalities Noted: No Dry / Scaly: No Maceration: No Wound Preparation Ulcer Cleansing: Rinsed/Irrigated with Saline Topical Anesthetic Applied: Other: lidocaine 4%, Treatment Notes Wound #2 (Left, Medial Lower Leg) 1. Cleansed with: Clean wound with Normal Saline 2. Anesthetic Topical Lidocaine 4% cream to wound bed prior to debridement 4. Dressing Applied: Santyl Ointment Saline moistened guaze 5. Secondary Dressing Applied Dry Gauze [place] Electronic Signature(s) Signed: 10/17/2017 4:43:15 PM By: [name] Entered By: [name] on 10/17/2017 10:52:58 Mccloud, [name] ([id number]) -------------------------------------------------------------------------------- Vitals Details Patient Name: Kayla Keel A. Date of Service: 10/17/2017 11:00 AM Medical Record Number: [id number] Patient Account Number: [id number] Date of Birth/Sex: 26-Dec-1955 (62 y.o. F) Treating RN: [name] Primary Care Aedin Jeansonne: Leotis Shames Other Clinician: Referring Alexios Keown: Leotis Shames Treating Loyal Rudy/Extender: STONE III, HOYT Weeks in Treatment: 1 Vital Signs  Time Taken: 10:45 Temperature (F): 98.2 Height (in): 61 Pulse (bpm): 80 Weight (lbs): 157 Respiratory Rate (breaths/min): 16 Body Mass Index (BMI): 29.7 Blood Pressure (mmHg): 106/73 Reference Range: 80 - 120 mg / dl Electronic Signature(s) Signed: 10/17/2017 4:43:15 PM By: Renne CriglerFlinchum, Cheryl Entered By: Renne CriglerFlinchum, Cheryl on 10/17/2017 10:46:32

## 2017-10-24 ENCOUNTER — Other Ambulatory Visit: Payer: Self-pay | Admitting: Internal Medicine

## 2017-10-24 DIAGNOSIS — Z1231 Encounter for screening mammogram for malignant neoplasm of breast: Secondary | ICD-10-CM

## 2017-10-27 ENCOUNTER — Encounter: Payer: Medicare Other | Admitting: Physician Assistant

## 2017-10-27 DIAGNOSIS — E11622 Type 2 diabetes mellitus with other skin ulcer: Secondary | ICD-10-CM | POA: Diagnosis not present

## 2017-11-05 ENCOUNTER — Encounter: Payer: Medicare Other | Attending: Nurse Practitioner | Admitting: Nurse Practitioner

## 2017-11-05 DIAGNOSIS — L409 Psoriasis, unspecified: Secondary | ICD-10-CM | POA: Insufficient documentation

## 2017-11-05 DIAGNOSIS — E7800 Pure hypercholesterolemia, unspecified: Secondary | ICD-10-CM | POA: Diagnosis not present

## 2017-11-05 DIAGNOSIS — E11621 Type 2 diabetes mellitus with foot ulcer: Secondary | ICD-10-CM | POA: Diagnosis not present

## 2017-11-05 DIAGNOSIS — E1140 Type 2 diabetes mellitus with diabetic neuropathy, unspecified: Secondary | ICD-10-CM | POA: Insufficient documentation

## 2017-11-05 DIAGNOSIS — L97322 Non-pressure chronic ulcer of left ankle with fat layer exposed: Secondary | ICD-10-CM | POA: Diagnosis not present

## 2017-11-05 DIAGNOSIS — L97212 Non-pressure chronic ulcer of right calf with fat layer exposed: Secondary | ICD-10-CM | POA: Diagnosis not present

## 2017-11-05 DIAGNOSIS — J449 Chronic obstructive pulmonary disease, unspecified: Secondary | ICD-10-CM | POA: Insufficient documentation

## 2017-11-14 ENCOUNTER — Encounter: Payer: Medicare Other | Admitting: Physician Assistant

## 2017-11-14 DIAGNOSIS — E11621 Type 2 diabetes mellitus with foot ulcer: Secondary | ICD-10-CM | POA: Diagnosis not present

## 2017-11-17 NOTE — Progress Notes (Signed)
Flo ShanksDAVIS, Tanessa A. ([id number]) Visit Report for 11/05/2017 Arrival Information Details Patient Name: Kayla KeelDAVIS, Sabriyah A. Date of Service: 11/05/2017 9:15 AM Medical Record Number: [id number] Patient Account Number: [id number] Date of Birth/Sex: 1955-10-23 (62 y.o. F) Treating RN: Curtis Sitesorthy, Joanna Primary Care Talani Brazee: Leotis ShamesSINGH, JASMINE Other Clinician: Referring Ulrich Soules: Leotis ShamesSINGH, JASMINE Treating Manuel Dall/Extender: Kathreen Cosieroulter, Leah Weeks in Treatment: 3 Visit Information History Since Last Visit Added or deleted any medications: No Patient Arrived: Ambulatory Any new allergies or adverse reactions: No Arrival Time: 08:45 Had a fall or experienced change in No Accompanied By: self activities of daily living that may affect Transfer Assistance: None risk of falls: Patient Identification Verified: Yes Signs or symptoms of abuse/neglect since last visito No Secondary Verification Process Completed: Yes Hospitalized since last visit: No Patient Has Alerts: Yes Implantable device outside of the clinic excluding No Patient Alerts: DMII cellular tissue based products placed in the center since last visit: Has Dressing in Place as Prescribed: Yes Pain Present Now: No Electronic Signature(s) Signed: 11/05/2017 3:43:39 PM By: Curtis Sitesorthy, Joanna Entered By: Curtis Sitesorthy, Joanna on 11/05/2017 08:45:36 Fleissner, [name] ([id number]) -------------------------------------------------------------------------------- Clinic Level of Care Assessment Details Patient Name: Kayla KeelAVIS, Romona A. Date of Service: 11/05/2017 9:15 AM Medical Record Number: [id number] Patient Account Number: [id number] Date of Birth/Sex: 1955-10-23 (62 y.o. F) Treating RN: Huel CoventryWoody, Kim Primary Care Leeon Makar: Leotis ShamesSINGH, JASMINE Other Clinician: Referring [place]a Retana: Leotis ShamesSINGH, JASMINE Treating Toshua Honsinger/Extender: Kathreen Cosieroulter, Leah Weeks in Treatment: 3 Clinic Level of Care Assessment Items TOOL 4 Quantity Score []  - Use when only an EandM is performed on FOLLOW-UP  visit 0 ASSESSMENTS - Nursing Assessment / Reassessment []  - Reassessment of Co-morbidities (includes updates in patient status) 0 X- 1 5 Reassessment of Adherence to Treatment Plan ASSESSMENTS - Wound and Skin Assessment / Reassessment []  - Simple Wound Assessment / Reassessment - one wound 0 X- 2 5 Complex Wound Assessment / Reassessment - multiple wounds []  - 0 Dermatologic / Skin Assessment (not related to wound area) ASSESSMENTS - Focused Assessment []  - Circumferential Edema Measurements - multi extremities 0 []  - 0 Nutritional Assessment / Counseling / Intervention []  - 0 Lower Extremity Assessment (monofilament, tuning fork, pulses) []  - 0 Peripheral Arterial Disease Assessment (using hand held doppler) ASSESSMENTS - Ostomy and/or Continence Assessment and Care []  - Incontinence Assessment and Management 0 []  - 0 Ostomy Care Assessment and Management (repouching, etc.) PROCESS - Coordination of Care X - Simple Patient / Family Education for ongoing care 1 15 []  - 0 Complex (extensive) Patient / Family Education for ongoing care []  - 0 Staff obtains ChiropractorConsents, Records, Test Results / Process Orders []  - 0 Staff telephones HHA, Nursing Homes / Clarify orders / etc []  - 0 Routine Transfer to another Facility (non-emergent condition) []  - 0 Routine Hospital Admission (non-emergent condition) []  - 0 New Admissions / Manufacturing engineernsurance Authorizations / Ordering NPWT, Apligraf, etc. []  - 0 Emergency Hospital Admission (emergent condition) X- 1 10 Simple Discharge Coordination Mcglynn, Vadie A. ([id number]) []  - 0 Complex (extensive) Discharge Coordination PROCESS - Special Needs []  - Pediatric / Minor Patient Management 0 []  - 0 Isolation Patient Management []  - 0 Hearing / Language / Visual special needs []  - 0 Assessment of Community assistance (transportation, D/C planning, etc.) []  - 0 Additional assistance / Altered mentation []  - 0 Support Surface(s) Assessment  (bed, cushion, seat, etc.) INTERVENTIONS - Wound Cleansing / Measurement X - Simple Wound Cleansing - one wound 1 5 []  - 0 Complex Wound Cleansing - multiple wounds X- 1  5 Wound Imaging (photographs - any number of wounds) []  - 0 Wound Tracing (instead of photographs) X- 1 5 Simple Wound Measurement - one wound []  - 0 Complex Wound Measurement - multiple wounds INTERVENTIONS - Wound Dressings []  - Small Wound Dressing one or multiple wounds 0 X- 2 15 Medium Wound Dressing one or multiple wounds []  - 0 Large Wound Dressing one or multiple wounds []  - 0 Application of Medications - topical []  - 0 Application of Medications - injection INTERVENTIONS - Miscellaneous []  - External ear exam 0 []  - 0 Specimen Collection (cultures, biopsies, blood, body fluids, etc.) []  - 0 Specimen(s) / Culture(s) sent or taken to Lab for analysis []  - 0 Patient Transfer (multiple staff / Nurse, adult / Similar devices) []  - 0 Simple Staple / Suture removal (25 or less) []  - 0 Complex Staple / Suture removal (26 or more) []  - 0 Hypo / Hyperglycemic Management (close monitor of Blood Glucose) []  - 0 Ankle / Brachial Index (ABI) - do not check if billed separately X- 1 5 Vital Signs Terhune, Arzu A. ([id number]) Has the patient been seen at the hospital within the last three years: Yes Total Score: 90 Level Of Care: New/Established - Level 3 Electronic Signature(s) Signed: 11/05/2017 5:11:54 PM By: Elliot Gurney, BSN, RN, CWS, Kim RN, BSN Entered By: Elliot Gurney, BSN, RN, CWS, Kim on 11/05/2017 09:04:42 Birt, [name] ([id number]) -------------------------------------------------------------------------------- Encounter Discharge Information Details Patient Name: [name] A. Date of Service: 11/05/2017 9:15 AM Medical Record Number: [id number] Patient Account Number: [id number] Date of Birth/Sex: October 01, 1955 (62 y.o. F) Treating RN: Phillis Haggis Primary Care Jiaire Rosebrook: Leotis Shames Other  Clinician: Referring Jamicheal Heard: Leotis Shames Treating Krissia Schreier/Extender: Kathreen Cosier in Treatment: 3 Encounter Discharge Information Items Discharge Condition: Stable Ambulatory Status: Ambulatory Discharge Destination: Home Transportation: Private Auto Accompanied By: self Schedule Follow-up Appointment: Yes Clinical Summary of Care: Electronic Signature(s) Signed: 11/05/2017 9:15:56 AM By: Alejandro Mulling Entered By: Alejandro Mulling on 11/05/2017 09:15:56 Saggese, [name] ([id number]) -------------------------------------------------------------------------------- Lower Extremity Assessment Details Patient Name: [name] A. Date of Service: 11/05/2017 9:15 AM Medical Record Number: [id number] Patient Account Number: [id number] Date of Birth/Sex: 1955/05/10 (62 y.o. F) Treating RN: Curtis Sites Primary Care Marinna Blane: Leotis Shames Other Clinician: Referring Real Cona: Leotis Shames Treating Analyah Mcconnon/Extender: Kathreen Cosier in Treatment: 3 Edema Assessment Assessed: [Left: No] [Right: No] [Left: Edema] [Right: :] Calf Left: Right: Point of Measurement: 30 cm From Medial Instep 31.5 cm 32 cm Ankle Left: Right: Point of Measurement: 12 cm From Medial Instep 18.5 cm 18.2 cm Vascular Assessment Pulses: Dorsalis Pedis Palpable: [Left:Yes] [Right:Yes] Posterior Tibial Extremity colors, hair growth, and conditions: Extremity Color: [Left:Normal] [Right:Normal] Hair Growth on Extremity: [Left:Yes] [Right:Yes] Temperature of Extremity: [Left:Warm] [Right:Warm] Capillary Refill: [Left:< 3 seconds] [Right:< 3 seconds] Toe Nail Assessment Left: Right: Thick: Yes Yes Discolored: No No Deformed: No No Improper Length and Hygiene: No No Electronic Signature(s) Signed: 11/05/2017 3:43:39 PM By: Curtis Sites Entered By: Curtis Sites on 11/05/2017 08:56:14 Gerken, Briellah AMarland Kitchen  ([id number]) -------------------------------------------------------------------------------- Multi Wound Chart Details Patient Name: [name] A. Date of Service: 11/05/2017 9:15 AM Medical Record Number: [id number] Patient Account Number: [id number] Date of Birth/Sex: 1955-06-22 (62 y.o. F) Treating RN: Huel Coventry Primary Care Krisanne Lich: Leotis Shames Other Clinician: Referring Declyn Offield: Leotis Shames Treating Aiyonna Lucado/Extender: Kathreen Cosier in Treatment: 3 Vital Signs Height(in): 61 Pulse(bpm): 65 Weight(lbs): 157 Blood Pressure(mmHg): 134/82 Body Mass Index(BMI): 30 Temperature(F): 98.3 Respiratory Rate 16 (breaths/min): Photos: [1:No Photos] [2:No Photos] [N/A:N/A] Wound  Location: [1:Right Lower Leg - Anterior] [2:Left Lower Leg - Distal] [N/A:N/A] Wounding Event: [1:Trauma] [2:Gradually Appeared] [N/A:N/A] Primary Etiology: [1:Diabetic Wound/Ulcer of the Lower Extremity] [2:Diabetic Wound/Ulcer of the Lower Extremity] [N/A:N/A] Comorbid History: [1:Chronic Obstructive Pulmonary Disease (COPD), Hypertension, Type II Diabetes, Neuropathy] [2:Chronic Obstructive Pulmonary Disease (COPD), Hypertension, Type II Diabetes, Neuropathy] [N/A:N/A] Date Acquired: [1:09/25/2017] [2:10/10/2017] [N/A:N/A] Weeks of Treatment: [1:3] [2:2] [N/A:N/A] Wound Status: [1:Open] [2:Open] [N/A:N/A] Measurements L x W x D [1:4.9x1.2x0.2] [2:0.9x0.4x0.1] [N/A:N/A] (cm) Area (cm) : [1:4.618] [2:0.283] [N/A:N/A] Volume (cm) : [1:0.924] [2:0.028] [N/A:N/A] % Reduction in Area: [1:-68.00%] [2:70.00%] [N/A:N/A] % Reduction in Volume: [1:-236.00%] [2:70.20%] [N/A:N/A] Classification: [1:Grade 1] [2:Grade 2] [N/A:N/A] Exudate Amount: [1:Large] [2:Medium] [N/A:N/A] Exudate Type: [1:Purulent] [2:Serosanguineous] [N/A:N/A] Exudate Color: [1:yellow, brown, green] [2:red, brown] [N/A:N/A] Wound Margin: [1:Flat and Intact] [2:Distinct, outline attached] [N/A:N/A] Granulation Amount: [1:Small  (1-33%)] [2:None Present (0%)] [N/A:N/A] Granulation Quality: [1:Red] [2:N/A] [N/A:N/A] Necrotic Amount: [1:Large (67-100%)] [2:Large (67-100%)] [N/A:N/A] Necrotic Tissue: [1:Eschar, Adherent Slough] [2:Adherent Slough] [N/A:N/A] Exposed Structures: [1:Fat Layer (Subcutaneous Tissue) Exposed: Yes Fascia: No Tendon: No Muscle: No Joint: No Bone: No] [2:Fat Layer (Subcutaneous Tissue) Exposed: Yes Fascia: No Tendon: No Muscle: No Joint: No Bone: No] [N/A:N/A] Epithelialization: [1:None] [2:None] [N/A:N/A] Periwound Skin Texture: [1:Excoriation: No Induration: No] [2:Excoriation: No Induration: No] [N/A:N/A] Callus: No Callus: No Crepitus: No Crepitus: No Rash: No Rash: No Scarring: No Scarring: No Periwound Skin Moisture: Maceration: No Maceration: No N/A Dry/Scaly: No Dry/Scaly: No Periwound Skin Color: Erythema: Yes Erythema: Yes N/A Atrophie Blanche: No Atrophie Blanche: No Cyanosis: No Cyanosis: No Ecchymosis: No Ecchymosis: No Hemosiderin Staining: No Hemosiderin Staining: No Mottled: No Mottled: No Pallor: No Pallor: No Rubor: No Rubor: No Erythema Location: Circumferential Circumferential N/A Temperature: No Abnormality N/A N/A Tenderness on Palpation: Yes Yes N/A Wound Preparation: Ulcer Cleansing: Ulcer Cleansing: N/A Rinsed/Irrigated with Saline Rinsed/Irrigated with Saline Topical Anesthetic Applied: Topical Anesthetic Applied: Other: lidocaine 4% Other: lidocaine 4% Treatment Notes Electronic Signature(s) Signed: 11/05/2017 9:11:39 AM By: Bonnell Public Entered By: Bonnell Public on 11/05/2017 09:11:38 Hudgins, [name] ([id number]) -------------------------------------------------------------------------------- Multi-Disciplinary Care Plan Details Patient Name: [name] A. Date of Service: 11/05/2017 9:15 AM Medical Record Number: [id number] Patient Account Number: [id number] Date of Birth/Sex: 1956-01-25 (62 y.o. F) Treating RN: Huel Coventry Primary  Care Megean Fabio: Leotis Shames Other Clinician: Referring Kyarra Vancamp: Leotis Shames Treating Yaire Kreher/Extender: Kathreen Cosier in Treatment: 3 Active Inactive ` Abuse / Safety / Falls / Self Care Management Nursing Diagnoses: Potential for falls Goals: Patient will not experience any injury related to falls Date Initiated: 10/09/2017 Target Resolution Date: 02/07/2018 Goal Status: Active Interventions: Assess Activities of Daily Living upon admission and as needed Assess fall risk on admission and as needed Assess: immobility, friction, shearing, incontinence upon admission and as needed Notes: ` Nutrition Nursing Diagnoses: Imbalanced nutrition Impaired glucose control: actual or potential Potential for alteratiion in Nutrition/Potential for imbalanced nutrition Goals: Patient/caregiver agrees to and verbalizes understanding of need to use nutritional supplements and/or vitamins as prescribed Date Initiated: 10/09/2017 Target Resolution Date: 01/03/2018 Goal Status: Active Patient/caregiver will maintain therapeutic glucose control Date Initiated: 10/09/2017 Target Resolution Date: 02/07/2018 Goal Status: Active Interventions: Assess patient nutrition upon admission and as needed per policy Provide education on elevated blood sugars and impact on wound healing Provide education on nutrition Notes: ` Orientation to the Wound Care Program DARRIEL, [place]INQUEFIELD ([id number]) Nursing Diagnoses: Knowledge deficit related to the wound healing center program Goals: Patient/caregiver will verbalize understanding of the Wound Healing Center Program Date Initiated: 10/09/2017  Target Resolution Date: 11/08/2017 Goal Status: Active Interventions: Provide education on orientation to the wound center Notes: ` Pain, Acute or Chronic Nursing Diagnoses: Pain, acute or chronic: actual or potential Potential alteration in comfort, pain Goals: Patient/caregiver will verbalize adequate  pain control between visits Date Initiated: 10/09/2017 Target Resolution Date: 01/03/2018 Goal Status: Active Interventions: Complete pain assessment as per visit requirements Encourage patient to take pain medications as prescribed Notes: ` Wound/Skin Impairment Nursing Diagnoses: Impaired tissue integrity Knowledge deficit related to ulceration/compromised skin integrity Goals: Ulcer/skin breakdown will have a volume reduction of 80% by week 12 Date Initiated: 10/09/2017 Target Resolution Date: 01/03/2018 Goal Status: Active Interventions: Assess patient/caregiver ability to perform ulcer/skin care regimen upon admission and as needed Assess ulceration(s) every visit Notes: Electronic Signature(s) Signed: 11/05/2017 5:11:54 PM By: Elliot Gurney, BSN, RN, CWS, Kim RN, BSN Entered By: Elliot Gurney, BSN, RN, CWS, Kim on 11/05/2017 09:02:04 Ammar, [name] ([id number]) Tocco, [name] ([id number]) -------------------------------------------------------------------------------- Pain Assessment Details Patient Name: [name] A. Date of Service: 11/05/2017 9:15 AM Medical Record Number: [id number] Patient Account Number: [id number] Date of Birth/Sex: [birth year] (62 y.o. F) Treating RN: Curtis Sites Primary Care Korby Ratay: Leotis Shames Other Clinician: Referring Ramona Slinger: Leotis Shames Treating Syanna Remmert/Extender: Kathreen Cosier in Treatment: 3 Active Problems Location of Pain Severity and Description of Pain Patient Has Paino Yes Site Locations Pain Location: Pain in Ulcers With Dressing Change: Yes Duration of the Pain. Constant / Intermittento Constant Pain Management and Medication Current Pain Management: Electronic Signature(s) Signed: 11/05/2017 3:43:39 PM By: Curtis Sites Entered By: Curtis Sites on 11/05/2017 08:46:48 Dipierro, [name] ([id number]) -------------------------------------------------------------------------------- Patient/Caregiver Education  Details Patient Name: [name] A. Date of Service: 11/05/2017 9:15 AM Medical Record Number: [id number] Patient Account Number: [id number] Date of Birth/Gender: Aug 08, 1955 (62 y.o. F) Treating RN: Phillis Haggis Primary Care Physician: Leotis Shames Other Clinician: Referring Physician: Leotis Shames Treating Physician/Extender: Kathreen Cosier in Treatment: 3 Education Assessment Education Provided To: Patient Education Topics Provided Wound/Skin Impairment: Handouts: Caring for Your Ulcer, Skin Care Do's and Dont's, Other: change dressing as ordered Methods: Demonstration, Explain/Verbal Responses: State content correctly Electronic Signature(s) Signed: 11/06/2017 4:46:33 PM By: Alejandro Mulling Entered By: Alejandro Mulling on 11/05/2017 09:16:12 Ellegood, Merril A. ([id number]) -------------------------------------------------------------------------------- Wound Assessment Details Patient Name: [name] A. Date of Service: 11/05/2017 9:15 AM Medical Record Number: [id number] Patient Account Number: [id number] Date of Birth/Sex: 08-Dec-1955 (62 y.o. F) Treating RN: Curtis Sites Primary Care Carlson Belland: Leotis Shames Other Clinician: Referring Treniya Lobb: Leotis Shames Treating Regnia Mathwig/Extender: Kathreen Cosier in Treatment: 3 Wound Status Wound Number: 1 Primary Diabetic Wound/Ulcer of the Lower Extremity Etiology: Wound Location: Right Lower Leg - Anterior Wound Open Wounding Event: Trauma Status: Date Acquired: 09/25/2017 Comorbid Chronic Obstructive Pulmonary Disease Weeks Of Treatment: 3 History: (COPD), Hypertension, Type II Diabetes, Clustered Wound: No Neuropathy Photos Photo Uploaded By: Curtis Sites on 11/05/2017 09:13:56 Wound Measurements Length: (cm) 4.9 Width: (cm) 1.2 Depth: (cm) 0.2 Area: (cm) 4.618 Volume: (cm) 0.924 % Reduction in Area: -68% % Reduction in Volume: -236% Epithelialization: None Tunneling: No Undermining:  No Wound Description Classification: Grade 1 Wound Margin: Flat and Intact Exudate Amount: Large Exudate Type: Purulent Exudate Color: yellow, brown, green Foul Odor After Cleansing: No Slough/Fibrino Yes Wound Bed Granulation Amount: Small (1-33%) Exposed Structure Granulation Quality: Red Fascia Exposed: No Necrotic Amount: Large (67-100%) Fat Layer (Subcutaneous Tissue) Exposed: Yes Necrotic Quality: Eschar, Adherent Slough Tendon Exposed: No Muscle Exposed: No Joint Exposed: No Bone Exposed: No Periwound Skin Texture Heick, Vasti  A. ([id number]) Texture Color No Abnormalities Noted: No No Abnormalities Noted: No Callus: No Atrophie Blanche: No Crepitus: No Cyanosis: No Excoriation: No Ecchymosis: No Induration: No Erythema: Yes Rash: No Erythema Location: Circumferential Scarring: No Hemosiderin Staining: No Mottled: No Moisture Pallor: No No Abnormalities Noted: No Rubor: No Dry / Scaly: No Maceration: No Temperature / Pain Temperature: No Abnormality Tenderness on Palpation: Yes Wound Preparation Ulcer Cleansing: Rinsed/Irrigated with Saline Topical Anesthetic Applied: Other: lidocaine 4%, Treatment Notes Wound #1 (Right, Anterior Lower Leg) 1. Cleansed with: Clean wound with Normal Saline 2. Anesthetic Topical Lidocaine 4% cream to wound bed prior to debridement 3. Peri-wound Care: Skin Prep 4. Dressing Applied: Santyl Ointment Saline moistened guaze 5. Secondary Dressing Applied Dry Gauze [place] Electronic Signature(s) Signed: 11/05/2017 3:43:39 PM By: Curtis Sites Entered By: Curtis Sites on 11/05/2017 08:52:56 Brunelle, [name] ([id number]) -------------------------------------------------------------------------------- Wound Assessment Details Patient Name: [name] A. Date of Service: 11/05/2017 9:15 AM Medical Record Number: [id number] Patient Account Number: [id number] Date of Birth/Sex: [birth year] (62 y.o. F) Treating  RN: Curtis Sites Primary Care Zaylei Mullane: Leotis Shames Other Clinician: Referring Jeanclaude Wentworth: Leotis Shames Treating Seletha Zimmermann/Extender: Kathreen Cosier in Treatment: 3 Wound Status Wound Number: 2 Primary Diabetic Wound/Ulcer of the Lower Extremity Etiology: Wound Location: Left Lower Leg - Distal Wound Open Wounding Event: Gradually Appeared Status: Date Acquired: 10/10/2017 Comorbid Chronic Obstructive Pulmonary Disease Weeks Of Treatment: 2 History: (COPD), Hypertension, Type II Diabetes, Clustered Wound: No Neuropathy Photos Photo Uploaded By: Curtis Sites on 11/05/2017 09:13:57 Wound Measurements Length: (cm) 0.9 Width: (cm) 0.4 Depth: (cm) 0.1 Area: (cm) 0.283 Volume: (cm) 0.028 % Reduction in Area: 70% % Reduction in Volume: 70.2% Epithelialization: None Tunneling: No Undermining: No Wound Description Classification: Grade 2 Wound Margin: Distinct, outline attached Exudate Amount: Medium Exudate Type: Serosanguineous Exudate Color: red, brown Foul Odor After Cleansing: No Slough/Fibrino Yes Wound Bed Granulation Amount: None Present (0%) Exposed Structure Necrotic Amount: Large (67-100%) Fascia Exposed: No Necrotic Quality: Adherent Slough Fat Layer (Subcutaneous Tissue) Exposed: Yes Tendon Exposed: No Muscle Exposed: No Joint Exposed: No Bone Exposed: No Periwound Skin Texture Laplant, Angeletta A. ([id number]) Texture Color No Abnormalities Noted: No No Abnormalities Noted: No Callus: No Atrophie Blanche: No Crepitus: No Cyanosis: No Excoriation: No Ecchymosis: No Induration: No Erythema: Yes Rash: No Erythema Location: Circumferential Scarring: No Hemosiderin Staining: No Mottled: No Moisture Pallor: No No Abnormalities Noted: No Rubor: No Dry / Scaly: No Maceration: No Temperature / Pain Tenderness on Palpation: Yes Wound Preparation Ulcer Cleansing: Rinsed/Irrigated with Saline Topical Anesthetic Applied: Other: lidocaine  4%, Treatment Notes Wound #2 (Left, Distal Lower Leg) 1. Cleansed with: Clean wound with Normal Saline 2. Anesthetic Topical Lidocaine 4% cream to wound bed prior to debridement 3. Peri-wound Care: Skin Prep 4. Dressing Applied: Santyl Ointment Saline moistened guaze 5. Secondary Dressing Applied Dry Gauze [place] Electronic Signature(s) Signed: 11/05/2017 3:43:39 PM By: Curtis Sites Entered By: Curtis Sites on 11/05/2017 08:54:20 Mealy, [name] ([id number]) -------------------------------------------------------------------------------- Vitals Details Patient Name: [name] A. Date of Service: 11/05/2017 9:15 AM Medical Record Number: [id number] Patient Account Number: [id number] Date of Birth/Sex: July 06, 1955 (62 y.o. F) Treating RN: Curtis Sites Primary Care Lissett Favorite: Leotis Shames Other Clinician: Referring Ihan Pat: Leotis Shames Treating Rikayla Demmon/Extender: Kathreen Cosier in Treatment: 3 Vital Signs Time Taken: 08:47 Temperature (F): 98.3 Height (in): 61 Pulse (bpm): 65 Weight (lbs): 157 Respiratory Rate (breaths/min): 16 Body Mass Index (BMI): 29.7 Blood Pressure (mmHg): 134/82 Reference Range: 80 - 120 mg / dl Electronic  Signature(s) Signed: 11/05/2017 3:43:39 PM By: Curtis Sitesorthy, Joanna Entered By: Curtis Sitesorthy, Joanna on 11/05/2017 08:47:51

## 2017-11-17 NOTE — Progress Notes (Signed)
[name], [name] ([id number]) Visit Report for 11/05/2017 Chief Complaint Document Details Patient Name: Kayla KeelDAVIS, [name] Date of Service: 11/05/2017 9:15 AM Medical Record Number: [id number] Patient Account Number: [id number] Date of Birth/Sex: September 24, 1955 (62 y.o. F) Treating RN: Huel CoventryWoody, Kim Primary Care Provider: Leotis ShamesSINGH, JASMINE Other Clinician: Referring Provider: Leotis ShamesSINGH, JASMINE Treating Provider/Extender: Kathreen Cosieroulter, Don Giarrusso Weeks in Treatment: 3 Information Obtained from: Patient Chief Complaint wound Electronic Signature(s) Signed: 11/05/2017 9:11:54 AM By: Bonnell Publicoulter, Aluel Schwarz Entered By: Bonnell Publicoulter, Emelee Rodocker on 11/05/2017 09:11:54 Marsalis, Kayla CastillaEBORAH A. ([id number]) -------------------------------------------------------------------------------- HPI Details Patient Name: Kayla KeelAVIS, [name] Date of Service: 11/05/2017 9:15 AM Medical Record Number: [id number] Patient Account Number: [id number] Date of Birth/Sex: September 24, 1955 (62 y.o. F) Treating RN: Huel CoventryWoody, Kim Primary Care Provider: Leotis ShamesSINGH, JASMINE Other Clinician: Referring Provider: Leotis ShamesSINGH, JASMINE Treating Provider/Extender: Kathreen Cosieroulter, Madsen Riddle Weeks in Treatment: 3 History of Present Illness HPI Description: 10/09/17-She presents today as an initial evaluation for a right pretibial ulcer. She states approximately 3 weeks ago she fell, lacerating the anterior aspect of her right lower shin. She states she was prescribed Levaquin which she completed last week. She states she has been applying steroid cream to this area with no noted improvement; is eschar covered. She denies pain to this area. 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 She states she has seen [facility] [place] who has prescribed clobetasol ointment with no noted improvement. She has not been applying an occlusive dressing after the application of steroid cream. She states there is  no known exacerbating or relieving factors. She does complain of pain, pruritus and admits that presentation today is significantly better than they flareup she had last week. 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 We have requested notes from dermatology and will review those. She does state she has had a biopsy to the right foot. Overall, she is a poor historian 10/17/17 on evaluation today the patient whom I have not seen previously but has been in our clinic appears to be doing fairly well at this point in time. She has been tolerating the dressing changes without complication. With that being said I do not believe she's been using the amount of Santyl that is required in order to actually see the necrotic tissue softened at this point. Also think utilizing a Kerlex wrap as opposed to an inclusive Boarder Foam Dressing may be slowing things down because were not retaining enough moisture for the Santyl to be completely effective. With that being said I think both of these issues do need to be addressed at this point. In general however she still continues to have a lot of discomfort. 10/27/17 on evaluation today patient appears to actually be doing very well in regard to her wounds and that they are starting to dramatically loosen up in regard to the eschar and [facility] covering the surface of the wound. She has been tolerating the dressing changes without complication up to this point. She does have discomfort although it's not as bad she definitely states that when I'm cleaning the wounds it's much worse. 11/05/17-She is seen in follow-up evaluation for right pretibial and left anterior ankle wound. These are stable in appearance; she does not tolerate debridement attempt. We will continue with Santyl and she will follow-up next week Electronic Signature(s) Signed: 11/05/2017 9:12:58 AM By: Bonnell Publicoulter, Seymore Brodowski Previous Signature: 11/05/2017 8:40:04 AM Version By: Bonnell Publicoulter, Dionte Blaustein Entered By:  Bonnell Publicoulter, [employer] on 11/05/2017 09:12:57  [name], [name] ([id number]) -------------------------------------------------------------------------------- Physician Orders Details Patient Name: [name], [name] A. Date of Service: 11/05/2017 9:15 AM Medical Record Number: [id number] Patient Account Number: [id number] Date of Birth/Sex: [birth year] (62 y.o. F) Treating RN: Huel Coventry Primary Care Provider: Leotis Shames Other Clinician: Referring Provider: Leotis Shames Treating Provider/Extender: Kathreen Cosier in Treatment: 3 Verbal / Phone Orders: No Diagnosis Coding Wound Cleansing Wound #1 Right,Anterior Lower Leg o Clean wound with Normal Saline. o Cleanse wound with mild soap and water Wound #2 Left,Distal Lower Leg o Clean wound with Normal Saline. o Cleanse wound with mild soap and water Anesthetic (add to Medication List) Wound #1 Right,Anterior Lower Leg o Topical Lidocaine 4% cream applied to wound bed prior to debridement (In Clinic Only). Wound #2 Left,Distal Lower Leg o Topical Lidocaine 4% cream applied to wound bed prior to debridement (In Clinic Only). Primary Wound Dressing Wound #1 Right,Anterior Lower Leg o Saline moistened gauze o Santyl Ointment Wound #2 Left,Distal Lower Leg o Saline moistened gauze o Santyl Ointment Secondary Dressing Wound #1 Right,Anterior Lower Leg o [place] Wound #2 Left,Distal Lower Leg o [place] Dressing Change Frequency Wound #1 Right,Anterior Lower Leg o Change dressing every day. - once daily Wound #2 Left,Distal Lower Leg o Change dressing every day. - once daily Follow-up Appointments Wound #1 Right,Anterior Lower Leg o Return Appointment in 2 weeks. [name], [name] AMarland Kitchen ([id number]) Wound #2 Left,Distal Lower Leg o Return Appointment in 2 weeks. Edema Control Wound #1 Right,Anterior Lower Leg o Elevate legs to the level of the heart and pump ankles as often as possible Wound #2  Left,Distal Lower Leg o Elevate legs to the level of the heart and pump ankles as often as possible Additional Orders / Instructions Wound #1 Right,Anterior Lower Leg o Increase protein intake. o Activity as tolerated Wound #2 Left,Distal Lower Leg o Increase protein intake. o Activity as tolerated Electronic Signature(s) Signed: 11/05/2017 4:31:15 PM By: Bonnell Public Signed: 11/05/2017 5:11:54 PM By: Elliot Gurney, BSN, RN, CWS, Kim RN, BSN Entered By: Elliot Gurney, BSN, RN, CWS, Kim on 11/05/2017 09:03:53 [name], [name] ([id number]) -------------------------------------------------------------------------------- Problem List Details Patient Name: [name] A. Date of Service: 11/05/2017 9:15 AM Medical Record Number: [id number] Patient Account Number: [id number] Date of Birth/Sex: 1955/12/21 (62 y.o. 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 Date of Service: 11/05/2017 9:15 AM Medical Record Number: [id number] Patient Account Number: [id number] Date of Birth/Sex: 11/07/1955 (62 y.o. F) Treating RN: Huel Coventry Primary Care Provider: Leotis Shames Other Clinician: Referring Provider: Leotis Shames Treating  Provider/Extender: Kathreen Cosier in Treatment: 3 Subjective Chief Complaint Information obtained from Patient wound History of Present Illness (HPI) 10/09/17-She presents today as an initial evaluation for a right pretibial ulcer. She states approximately 3 weeks ago she fell, lacerating the anterior aspect of her right lower shin. She states she was prescribed Levaquin which she completed last week. She states she has been applying steroid cream to this  area with no noted improvement; is eschar covered. She denies pain to this area. 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 She states she has seen [facility] dermatology in [place] who has prescribed clobetasol ointment with no noted improvement. She has not been applying an occlusive dressing after the application of steroid cream. She states there is no known exacerbating or relieving factors. She does complain of pain, pruritus and admits that presentation today is significantly better than they flareup she had last week. 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 We have requested notes from dermatology and will review those. She does state she has had a biopsy to the right foot. Overall, she is a poor historian 10/17/17 on evaluation today the patient whom I have not seen previously but has been in our clinic appears to be doing fairly well at this point in time. She has been tolerating the dressing changes without complication. With that being said I do not believe she's been using the amount of Santyl that is required in order to actually see the necrotic tissue softened at this point. Also think utilizing a Kerlex wrap as opposed to an inclusive Boarder Foam Dressing may be slowing things down because were not retaining enough moisture for the Santyl to be completely effective. With that being said I think both  of these issues do need to be addressed at this point. In general however she still continues to have a lot of discomfort. 10/27/17 on evaluation today patient appears to actually be doing very well in regard to her wounds and that they are starting to dramatically loosen up in regard to the eschar and [facility] covering the surface of the wound. She has been tolerating the dressing changes without complication up to this point. She does have discomfort although it's not as bad she definitely states that when I'm cleaning the wounds it's much worse. 11/05/17-She is seen in follow-up evaluation for right pretibial and left anterior ankle wound. These are stable in appearance; she does not tolerate debridement attempt. We will continue with Santyl and she will follow-up next week Wound History Patient reportedly has not tested positive for osteomyelitis. Patient reportedly has not had testing performed to evaluate circulation in the legs. Patient History Information obtained from Patient. Family History Cancer - Mother, Heart Disease - Father, Lung Disease - Mother, No family history of Diabetes, Hereditary Spherocytosis, Hypertension, Kidney Disease, Seizures, Stroke, Thyroid Problems, Tuberculosis. Social History KENNESHIA, REHM ([id number]) Former smoker, Marital Status - Widowed, Alcohol Use - Rarely, Drug Use - No History, Caffeine Use - Moderate. 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 Respiratory Denies complaints or symptoms of Chronic or frequent coughs, Shortness of Breath. Cardiovascular Denies complaints or symptoms of Chest pain, LE edema. Gastrointestinal Denies complaints or symptoms of Frequent diarrhea, Nausea, Vomiting. Objective Constitutional Vitals Time Taken: 8:47 AM,  Height: 61 in, Weight: 157 lbs, BMI: 29.7, Temperature: 98.3 F, Pulse: 65 bpm, Respiratory Rate: 16 breaths/min, Blood Pressure: 134/82 mmHg. Integumentary (Hair, Skin) Wound #1 status is Open. Original cause of wound was Trauma. The wound is located on the Right,Anterior Lower Leg. The wound measures 4.9cm length x 1.2cm width x 0.2cm depth; 4.618cm^2 area and 0.924cm^3 volume. There is Fat Layer (Subcutaneous Tissue) Exposed exposed.  There is no tunneling or undermining noted. There is a large amount of purulent drainage noted. The wound margin is flat and intact. There is small (1-33%) red granulation within the wound bed. There is a large (67-100%) amount of necrotic tissue within the wound bed including Eschar and Adherent Slough. The periwound skin appearance exhibited: Erythema. The periwound skin appearance did not exhibit: Callus, Crepitus, Excoriation, Induration, Rash, Scarring, Dry/Scaly, Maceration, Atrophie Blanche, Cyanosis, Ecchymosis, Hemosiderin Staining, Mottled, Pallor, Rubor. The surrounding wound skin color is noted with erythema which is circumferential. Periwound temperature was noted as No Abnormality. The periwound has tenderness on palpation. Wound #2 status is Open. Original cause of wound was Gradually Appeared. The wound is located on the Left,Distal Lower Leg. The wound measures 0.9cm length x 0.4cm width x 0.1cm depth; 0.283cm^2 area and 0.028cm^3 volume. There is Fat Layer (Subcutaneous Tissue) Exposed exposed. There is no tunneling or undermining noted. There is a medium amount of serosanguineous drainage noted. The wound margin is distinct with the outline attached to the wound base. There is no granulation within the wound bed. There is a large (67-100%) amount of necrotic tissue within the wound bed including Adherent Slough. The periwound skin appearance exhibited: Erythema. The periwound skin appearance did not exhibit: Callus, Crepitus, Excoriation,  Induration, Rash, Scarring, Dry/Scaly, Maceration, Atrophie Blanche, Cyanosis, Ecchymosis, Hemosiderin Staining, Mottled, Pallor, Rubor. The surrounding wound skin color is noted with erythema which is circumferential. The periwound has tenderness on palpation. 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 Cleanse wound with mild soap and water Wound #2 Left,Distal Lower Leg: Clean wound with Normal Saline. Cleanse wound with mild soap and water Anesthetic (add to Medication List): Wound #1 Right,Anterior Lower Leg: Topical Lidocaine 4% cream applied to wound bed prior to debridement (In Clinic Only). Wound #2 Left,Distal Lower Leg: Topical Lidocaine 4% cream applied to wound bed prior to debridement (In Clinic Only). Primary Wound Dressing: Wound #1 Right,Anterior Lower Leg: Saline moistened gauze Santyl Ointment Wound #2 Left,Distal Lower Leg: Saline moistened gauze Santyl Ointment Secondary Dressing: Wound #1 Right,Anterior Lower Leg: [place] Wound #2 Left,Distal Lower Leg: [place] Dressing Change Frequency: Wound #1 Right,Anterior Lower Leg: Change dressing every day. - once daily Wound #2 Left,Distal Lower Leg: Change dressing every day. - once daily Follow-up Appointments: Wound #1 Right,Anterior Lower Leg: Return Appointment in 2 weeks. Wound #2 Left,Distal Lower Leg: Return Appointment in 2 weeks. Edema Control: Wound #1 Right,Anterior Lower Leg: Elevate legs to the level of the heart and pump ankles as often as possible Wound #2 Left,Distal Lower Leg: Elevate legs to the level of the heart and pump ankles as often as possible Additional Orders / Instructions: Wound #1 Right,Anterior Lower  Leg: Increase protein intake. [name], [name] AMarland Kitchen ([id number]) Activity as tolerated Wound #2 Left,Distal Lower Leg: Increase protein intake. Activity as tolerated Electronic Signature(s) Signed: 11/05/2017 9:26:32 AM By: Bonnell Public Entered By: Bonnell Public on 11/05/2017 09:26:31 [place], [name] ([id number]) -------------------------------------------------------------------------------- ROS/PFSH Details Patient Name: [name] A. Date of Service: 11/05/2017 9:15 AM Medical Record Number: [id number] Patient Account Number: [id number] Date of Birth/Sex: 12-06-1955 (62 y.o. F) Treating RN: Huel Coventry Primary Care Provider: Leotis Shames Other Clinician: Referring Provider: Leotis Shames Treating Provider/Extender: Kathreen Cosier in Treatment: 3 Information Obtained From Patient  Wound History Do you currently have one or more open woundso Yes Has your wound(s) ever healed and then re-openedo No Have you had any lab work done in the past montho No Have you tested positive for an antibiotic resistant organism (MRSA, VRE)o No Have you tested positive for osteomyelitis (bone infection)o No Have you had any tests for circulation on your legso No Constitutional Symptoms (General Health) Complaints and Symptoms: Negative for: Fatigue; Fever; Chills Respiratory Complaints and Symptoms: Negative for: Chronic or frequent coughs; Shortness of Breath Medical History: Positive for: Chronic Obstructive Pulmonary Disease (COPD) - chronic bronchitis Negative for: Aspiration; Asthma; Pneumothorax; Sleep Apnea; Tuberculosis Cardiovascular Complaints and Symptoms: Negative for: Chest pain; LE edema Medical History: Positive for: Hypertension Negative for: Angina; Arrhythmia; Congestive Heart Failure; Coronary Artery Disease; Deep Vein Thrombosis; Hypotension; Myocardial Infarction; Peripheral Arterial Disease; Peripheral Venous Disease; Phlebitis; Vasculitis Past Medical History  Notes: hypercholesteremia Gastrointestinal Complaints and Symptoms: Negative for: Frequent diarrhea; Nausea; Vomiting Medical History: Negative for: Cirrhosis ; Colitis; Crohnos; Hepatitis A; Hepatitis B; Hepatitis C Eyes Medical History: Negative for: Cataracts; Glaucoma; Optic Neuritis [name], [name] A. ([id number]) Ear/Nose/Mouth/Throat Medical History: Negative for: Chronic sinus problems/congestion; Middle ear problems Hematologic/Lymphatic Medical History: Negative for: Anemia; Hemophilia; Human Immunodeficiency Virus; Lymphedema; Sickle Cell Disease Endocrine Medical History: Positive for: Type II Diabetes Negative for: Type I Diabetes Genitourinary Medical History: Negative for: End Stage Renal Disease Immunological Medical History: Negative for: Lupus Erythematosus; Raynaudos; Scleroderma Integumentary (Skin) Medical History: Negative for: History of Burn; History of pressure wounds Musculoskeletal Medical History: Negative for: Gout; Rheumatoid Arthritis; Osteoarthritis; Osteomyelitis Neurologic Medical History: Positive for: Neuropathy Negative for: Dementia; Quadriplegia; Paraplegia; Seizure Disorder Past Medical History Notes: 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 ([id number]) Family and Social History Cancer: Yes - Mother; Diabetes: No; Heart Disease: Yes - Father; Hereditary Spherocytosis: No; Hypertension: No; Kidney Disease: No; Lung Disease: Yes - Mother; Seizures: No; Stroke: No; Thyroid Problems: No; Tuberculosis: No; Former smoker; Marital Status - Widowed; Alcohol Use: Rarely; Drug Use: No History; Caffeine Use: Moderate; Financial Concerns: No; Food, Clothing or Shelter  Needs: No; Support System Lacking: No; Transportation Concerns: No; Advanced Directives: No; Patient does not want information on Advanced Directives Physician Affirmation I have reviewed and agree with the above information. Electronic Signature(s) Signed: 11/05/2017 4:31:15 PM By: Bonnell Public Signed: 11/05/2017 5:11:54 PM By: Elliot Gurney, BSN, RN, CWS, Kim RN, BSN Entered By: Bonnell Public on 11/05/2017 09:13:58 [place]arr[place], [name] ([id number]) -------------------------------------------------------------------------------- SuperBill Details Patient Name: [name] A. Date of Service: 11/05/2017 Medical Record Number: [id number] Patient Account Number: [id number] Date of Birth/Sex: 1955-07-21 (62 y.o. 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

## 2017-11-21 ENCOUNTER — Ambulatory Visit: Payer: Medicare Other | Admitting: Physician Assistant

## 2017-11-25 ENCOUNTER — Encounter: Payer: Medicare Other | Admitting: Physician Assistant

## 2017-11-25 DIAGNOSIS — E11621 Type 2 diabetes mellitus with foot ulcer: Secondary | ICD-10-CM | POA: Diagnosis not present

## 2017-11-27 NOTE — Progress Notes (Signed)
Flo ShanksDAVIS, Jeilani A. ([id number]) Visit Report for 11/14/2017 Chief Complaint Document Details Patient Name: Kayla KeelDAVIS, Caron A. Date of Service: 11/14/2017 2:45 PM Medical Record Number: [id number] Patient Account Number: [id number] Date of Birth/Sex: 1955/09/25 (62 y.o. F) Treating RN: Curtis Sitesorthy, Joanna Primary Care Provider: Leotis ShamesSINGH, JASMINE Other Clinician: Referring Provider: Leotis ShamesSINGH, JASMINE Treating Provider/Extender: STONE III, HOYT Weeks in Treatment: 5 Information Obtained from: Patient Chief Complaint Left lower extremity ulcers Electronic Signature(s) Signed: 11/17/2017 8:05:21 AM By: Lenda KelpStone III, Hoyt PA-C Entered By: Lenda KelpStone III, Hoyt on 11/14/2017 14:47:04 Schoonmaker, [name] ([id number]) -------------------------------------------------------------------------------- Debridement Details Patient Name: Kayla KeelAVIS, Delores A. Date of Service: 11/14/2017 2:45 PM Medical Record Number: [id number] Patient Account Number: [id number] Date of Birth/Sex: 1955/09/25 (62 y.o. F) Treating RN: Curtis Sitesorthy, Joanna Primary Care Provider: Leotis ShamesSINGH, JASMINE Other Clinician: Referring Provider: Leotis ShamesSINGH, JASMINE Treating Provider/Extender: STONE III, HOYT Weeks in Treatment: 5 Debridement Performed for Wound #1 Right,Anterior Lower Leg Assessment: Performed By: Physician STONE III, HOYT E., PA-C Debridement Type: Debridement Severity of Tissue Pre Fat layer exposed Debridement: Pre-procedure Verification/Time Yes - 15:21 Out Taken: Start Time: 15:21 Pain Control: Lidocaine 4% Topical Solution Total Area Debrided (L x W): 5 (cm) x 1.2 (cm) = 6 (cm) Tissue and other material Viable, Non-Viable, Slough, Subcutaneous, Slough debrided: Level: Skin/Subcutaneous Tissue Debridement Description: Excisional Instrument: Curette Bleeding: Minimum Hemostasis Achieved: Pressure End Time: 15:23 Procedural Pain: 0 Post Procedural Pain: 0 Response to Treatment: Procedure was tolerated well Level of Consciousness: Awake  and Alert Post Debridement Measurements of Total Wound Length: (cm) 5 Width: (cm) 1.2 Depth: (cm) 0.3 Volume: (cm) 1.414 Character of Wound/Ulcer Post Debridement: Improved Severity of Tissue Post Debridement: Fat layer exposed Post Procedure Diagnosis Same as Pre-procedure Electronic Signature(s) Signed: 11/14/2017 5:05:24 PM By: Curtis Sitesorthy, Joanna Signed: 11/17/2017 8:05:21 AM By: Lenda KelpStone III, Hoyt PA-C Entered By: Curtis Sitesorthy, Joanna on 11/14/2017 15:22:25 Preuss, Reyonna A. ([id number]) -------------------------------------------------------------------------------- HPI Details Patient Name: Kayla KeelAVIS, Nohemi A. Date of Service: 11/14/2017 2:45 PM Medical Record Number: [id number] Patient Account Number: [id number] Date of Birth/Sex: 1955/09/25 (62 y.o. F) Treating RN: Curtis Sitesorthy, Joanna Primary Care Provider: Leotis ShamesSINGH, JASMINE Other Clinician: Referring Provider: Leotis ShamesSINGH, JASMINE Treating Provider/Extender: Linwood DibblesSTONE III, HOYT Weeks in Treatment: 5 History of Present Illness HPI Description: 10/09/17-She presents today as an initial evaluation for a right pretibial ulcer. She states approximately 3 weeks ago she fell, lacerating the anterior aspect of her right lower shin. She states she was prescribed Levaquin which she completed last week. She states she has been applying steroid cream to this area with no noted improvement; is eschar covered. She denies pain to this area. 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 She states she has seen [facility] [place] [place] who has prescribed clobetasol ointment with no noted improvement. She has not been applying an occlusive dressing after the application of steroid cream. She states there is no known exacerbating or relieving factors. She does complain of pain, pruritus and admits that presentation today is significantly better than they flareup she  had last week. 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 We have requested notes from dermatology and will review those. She does state she has had a biopsy to the right foot. Overall, she is a poor historian 10/17/17 on evaluation today the patient whom I have not seen previously but has been in our clinic appears to be doing fairly well at this point in time. She has been  tolerating the dressing changes without complication. With that being said I do not believe she's been using the amount of Santyl that is required in order to actually see the necrotic tissue softened at this point. Also think utilizing a Kerlex wrap as opposed to an inclusive Boarder Foam Dressing may be slowing things down because were not retaining enough moisture for the Santyl to be completely effective. With that being said I think both of these issues do need to be addressed at this point. In general however she still continues to have a lot of discomfort. 10/27/17 on evaluation today patient appears to actually be doing very well in regard to her wounds and that they are starting to dramatically loosen up in regard to the eschar and [facility] covering the surface of the wound. She has been tolerating the dressing changes without complication up to this point. She does have discomfort although it's not as bad she definitely states that when I'm cleaning the wounds it's much worse. 11/05/17-She is seen in follow-up evaluation for right pretibial and left anterior ankle wound. These are stable in appearance; she does not tolerate debridement attempt. We will continue with Santyl and she will follow-up next week 11/14/17 on evaluation today patient's wounds actually seem to be doing better in fact her left lower Trinity wound actually appears to be almost completely healed. With that being said she's been tolerating the dressing changes without complication. There does not appear to be any evidence of infection at this  time. 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 Electronic Signature(s) Signed: 11/17/2017 8:05:21 AM By: Lenda Kelp PA-C Entered By: Lenda Kelp on 11/14/2017 15:27:54 Swango, [name] ([id number]) -------------------------------------------------------------------------------- Physical Exam Details Patient Name: [name] A. Date of Service: 11/14/2017 2:45 PM Medical Record Number: [id number] Patient Account Number: [id number] Date of Birth/Sex: Apr 04, 1955 (62 y.o. F) Treating RN: Curtis Sites Primary Care Provider: Leotis Shames Other Clinician: Referring Provider: Leotis Shames Treating Provider/Extender: STONE III, HOYT Weeks in Treatment: 5 Constitutional Well-nourished and well-hydrated in no acute distress. Respiratory normal breathing without difficulty. Psychiatric this patient is able to make decisions and demonstrates good insight into disease process. Alert and Oriented x 3. pleasant and cooperative. Notes On inspection today patient's wound actually showing signs of good improvement at this time. She has been tolerating the dressing changes without complication. 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 Sharp debridement was performed of the wounds today she tolerated this with discomfort but nonetheless post debridement the wound beds appear to be better. Electronic Signature(s) Signed: 11/17/2017 8:05:21 AM By: Lenda Kelp PA-C Entered By: Lenda Kelp on 11/14/2017 15:28:40 Fellows, [name]  ([id number]) -------------------------------------------------------------------------------- Physician Orders Details Patient Name: [name] A. Date of Service: 11/14/2017 2:45 PM Medical Record Number: [id number] Patient Account Number: [id number] Date of Birth/Sex: Jan 20, 1956 (62 y.o. 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 o Cleanse wound with mild soap and water Wound #2 Left,Distal Lower Leg o Clean wound with Normal Saline. o Cleanse wound with mild soap and water Anesthetic (add to Medication List) Wound #1 Right,Anterior Lower Leg o Topical Lidocaine 4% cream applied to wound bed prior to debridement (In Clinic Only). Wound #2 Left,Distal Lower Leg o Topical Lidocaine 4% cream applied to wound bed prior to debridement (In Clinic Only). Primary Wound Dressing Wound #1 Right,Anterior Lower Leg o Saline moistened gauze o Santyl Ointment Wound #2 Left,Distal Lower Leg o Silver Collagen Secondary Dressing Wound #1 Right,Anterior Lower Leg o [place] Wound #2 Left,Distal Lower Leg o [place] Dressing Change Frequency Wound #1 Right,Anterior Lower Leg o Change dressing every day. - once daily Wound #2 Left,Distal Lower Leg o Change dressing every day. - once daily Mavity, Sabella A. ([id number]) Follow-up Appointments Wound #1 Right,Anterior Lower Leg o Return Appointment in 2 weeks. Wound #2 Left,Distal Lower Leg o Return Appointment in 2 weeks. Edema  Control Wound #1 Right,Anterior Lower Leg o Elevate legs to the level of the heart and pump ankles as often as possible Wound #2 Left,Distal Lower Leg o Elevate legs to the level of the heart and pump ankles as often as possible Additional Orders / Instructions Wound #1 Right,Anterior Lower Leg o Increase protein intake. o Activity as tolerated Wound #2 Left,Distal Lower Leg o Increase protein intake. o Activity as tolerated Electronic Signature(s) Signed: 11/14/2017 5:05:24 PM By: Curtis Sites Signed: 11/17/2017 8:05:21 AM By: Lenda Kelp PA-C Entered By: Curtis Sites on 11/14/2017 15:24:32 Skalla, [name] ([id number]) -------------------------------------------------------------------------------- Problem List Details Patient Name: [name] A. Date of Service: 11/14/2017 2:45 PM Medical Record Number: [id number] Patient Account Number: [id number] Date of Birth/Sex: Aug 01, 1955 (62 y.o. 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 Date of Service: 11/14/2017 2:45 PM Medical Record Number: [id number] Patient Account Number: [id number] Date of Birth/Sex: [birth year] (62 y.o.  F) Treating RN: Curtis Sites Primary Care Provider: Leotis Shames Other Clinician: Referring Provider: Leotis Shames Treating Provider/Extender: Linwood Dibbles, HOYT Weeks in Treatment: 5 Subjective Chief Complaint Information obtained from Patient Left lower extremity ulcers History of Present Illness (HPI) 10/09/17-She presents today as an initial evaluation for a right pretibial ulcer. She states approximately 3 weeks ago she fell, lacerating the anterior aspect of her right lower shin. She states she was prescribed Levaquin which she completed last week. She states she has been applying steroid cream to this area with no noted improvement; is eschar covered. She denies pain to this area. 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 She states she has seen  [facility] dermatology in [place] who has prescribed clobetasol ointment with no noted improvement. She has not been applying an occlusive dressing after the application of steroid cream. She states there is no known exacerbating or relieving factors. She does complain of pain, pruritus and admits that presentation today is significantly better than they flareup she had last week. 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 We have requested notes from dermatology and will review those. She does state she has had a biopsy to the right foot. Overall, she is a poor historian 10/17/17 on evaluation today the patient whom I have not seen previously but has been in our clinic appears to be doing fairly well at this point in time. She has been tolerating the dressing changes without complication. With that being said I do not believe she's been using the amount of Santyl that is required in order to actually see the necrotic tissue softened at this point. Also think utilizing a Kerlex wrap as opposed to an inclusive Boarder Foam  Dressing may be slowing things down because were not retaining enough moisture for the Santyl to be completely effective. With that being said I think both of these issues do need to be addressed at this point. In general however she still continues to have a lot of discomfort. 10/27/17 on evaluation today patient appears to actually be doing very well in regard to her wounds and that they are starting to dramatically loosen up in regard to the eschar and [facility] covering the surface of the wound. She has been tolerating the dressing changes without complication up to this point. She does have discomfort although it's not as bad she definitely states that when I'm cleaning the wounds it's much worse. 11/05/17-She is seen in follow-up evaluation for right pretibial and left anterior ankle wound. These are stable in appearance; she does not tolerate debridement attempt. We will continue with Santyl and she will follow-up next week 11/14/17 on evaluation today patient's wounds actually seem to be doing better in fact her left lower Trinity wound actually appears to be almost completely healed. With that being said she's been tolerating the dressing changes without complication. There does not appear to be any evidence of infection at this time. 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 Patient History Information obtained from Patient. Family History Cancer - Mother, Heart Disease - Father, Lung Disease - Mother, No family history of Diabetes, Hereditary Spherocytosis, Hypertension, Kidney Disease, Seizures, Stroke, Thyroid Problems, Tuberculosis. ILENE, WITCHER ([id number]) Social History Former smoker, Marital Status - Widowed, Alcohol Use - Rarely, Drug Use - No History, Caffeine Use -  Moderate. 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 Respiratory The patient has no complaints or symptoms. Cardiovascular The patient has no complaints or symptoms. Psychiatric The patient has no complaints or symptoms. Objective Constitutional Well-nourished and well-hydrated in no acute distress. Vitals Time Taken: 2:57 PM, Height: 61 in, Weight: 157 lbs, BMI: 29.7, Temperature: 98.1 F, Pulse: 70 bpm, Respiratory Rate: 16 breaths/min, Blood Pressure: 116/58 mmHg. Respiratory normal breathing without difficulty. Psychiatric this patient is able to make decisions and demonstrates good insight into disease process.  Alert and Oriented x 3. pleasant and cooperative. General Notes: On inspection today patient's wound actually showing signs of good improvement at this time. She has been tolerating the dressing changes without complication. 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 Sharp debridement was performed of the wounds today she tolerated this with discomfort but nonetheless post debridement the wound beds appear to be better. Integumentary (Hair, Skin) Wound #1 status is Open. Original cause of wound was Trauma. The wound is located on the Right,Anterior Lower Leg. The wound measures 5cm length x 1.2cm width x 0.2cm depth; 4.712cm^2 area and 0.942cm^3 volume. There is Fat Layer (Subcutaneous Tissue) Exposed exposed. There is no tunneling or undermining noted. There is a large amount of purulent drainage noted. The wound margin is flat and intact. There is medium (34-66%) red granulation within the wound bed.  There is a medium (34-66%) amount of necrotic tissue within the wound bed including Eschar and Adherent Slough. The periwound skin appearance did not exhibit: Callus, Crepitus, Excoriation, Induration, Rash, Scarring, Dry/Scaly, Maceration, Atrophie Blanche, Cyanosis, Ecchymosis, Hemosiderin Staining, Mottled, Pallor, Rubor, Erythema. Periwound temperature was noted Duckett, Annaston A. ([id number]) as No Abnormality. The periwound has tenderness on palpation. Wound #2 status is Open. Original cause of wound was Gradually Appeared. The wound is located on the Left,Distal Lower Leg. The wound measures 0.6cm length x 0.3cm width x 0.1cm depth; 0.141cm^2 area and 0.014cm^3 volume. There is Fat Layer (Subcutaneous Tissue) Exposed exposed. There is no tunneling or undermining noted. There is a none present amount of drainage noted. The wound margin is distinct with the outline attached to the wound base. There is no granulation within the wound bed. There is no necrotic tissue within the wound bed. The periwound skin appearance did not exhibit: Callus, Crepitus, Excoriation, Induration, Rash, Scarring, Dry/Scaly, Maceration, Atrophie Blanche, Cyanosis, Ecchymosis, Hemosiderin Staining, Mottled, Pallor, Rubor, Erythema. Periwound temperature was noted as No Abnormality. The periwound has tenderness on palpation. Other Condition(s) Patient presents with Rash / Dermatitis located on the Right Foot. The skin appearance exhibited: Dry/Scaly, Erythema, Friable, Maceration, Rash. Skin temperature was noted as No Abnormality. There is tenderness on palpation. 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 There was a Excisional Skin/Subcutaneous Tissue Debridement with a total area of 6 sq cm performed by STONE III, HOYT E., PA-C. With the following instrument(s): Curette to remove Viable and Non-Viable tissue/material. Material removed includes Subcutaneous Tissue and Slough and after achieving pain control using Lidocaine 4% Topical Solution. No specimens were taken. A time out was conducted at 15:21, prior to the start of the procedure. A Minimum amount of bleeding was controlled with Pressure. The procedure was tolerated well with a pain level of 0 throughout and a pain level of 0 following the procedure. Patient s Level of Consciousness post procedure was recorded as Awake and Alert. Post Debridement Measurements: 5cm length x 1.2cm width x  0.3cm depth; 1.414cm^3 volume. Character of Wound/Ulcer Post Debridement is improved. Severity of Tissue Post Debridement is: Fat layer exposed. Post procedure Diagnosis Wound #1: Same as Pre-Procedure Plan Wound Cleansing: Wound #1 Right,Anterior Lower Leg: Clean wound with Normal Saline. BALEIGH, RENNAKER A. ([id number]) Cleanse wound with mild soap and water Wound #2 Left,Distal Lower Leg: Clean wound with Normal Saline. Cleanse wound with mild soap and water Anesthetic (add to Medication List): Wound #1 Right,Anterior Lower Leg: Topical Lidocaine 4% cream applied to wound bed prior to debridement (In Clinic Only). Wound #2 Left,Distal Lower Leg: Topical Lidocaine 4% cream applied to wound bed prior to debridement (In Clinic Only). Primary Wound Dressing: Wound #1 Right,Anterior Lower Leg: Saline moistened gauze Santyl Ointment Wound #2 Left,Distal Lower Leg: Silver Collagen Secondary Dressing: Wound #1 Right,Anterior Lower Leg: [place] Wound #2 Left,Distal Lower Leg: [place] Dressing  Change Frequency: Wound #1 Right,Anterior Lower Leg: Change dressing every day. - once daily Wound #2 Left,Distal Lower Leg: Change dressing every day. - once daily Follow-up Appointments: Wound #1 Right,Anterior Lower Leg: Return Appointment in 2 weeks. Wound #2 Left,Distal Lower Leg: Return Appointment in 2 weeks. Edema Control: Wound #1 Right,Anterior Lower Leg: Elevate legs to the level of the heart and pump ankles as often as possible Wound #2 Left,Distal Lower Leg: Elevate legs to the level of the heart and pump ankles as often as possible Additional Orders / Instructions: Wound #1 Right,Anterior Lower Leg: Increase protein intake. Activity as tolerated Wound #2 Left,Distal Lower Leg: Increase protein intake. Activity as tolerated I'm gonna suggest at this point that we actually continue with the Current wound care measures for the next week. She's in agreement with plan. We will subsequently see were things stand at follow-up. Please see above for specific wound care orders. We will see patient for re-evaluation in 1 week(s) here in the clinic. If anything worsens or changes patient will contact our office for additional recommendations. Electronic Signature(s) Signed: 11/17/2017 8:05:21 AM By: Lenda Kelp PA-C Entered By: Lenda Kelp on 11/14/2017 15:28:57 Wender, [name] ([id number]) -------------------------------------------------------------------------------- ROS/PFSH Details Patient Name: [name] A. Date of Service: 11/14/2017 2:45 PM Medical Record Number: [id number] Patient Account Number: [id number] Date of Birth/Sex: Jul 10, 1955 (62 y.o. F) Treating RN: Curtis Sites Primary Care Provider: Leotis Shames Other Clinician: Referring Provider: Leotis Shames Treating Provider/Extender: STONE III, HOYT Weeks in Treatment: 5 Information Obtained From Patient Wound History Do you currently have one or more open woundso Yes Has your wound(s) ever  healed and then re-openedo No Have you had any lab work done in the past montho No Have you tested positive for an antibiotic resistant organism (MRSA, VRE)o No Have you tested positive for osteomyelitis (bone infection)o No Have you had any tests for circulation on your legso No Constitutional Symptoms (General Health) Complaints and Symptoms: Negative for: Fever; Chills Eyes Medical History: Negative for: Cataracts; Glaucoma; Optic Neuritis Ear/Nose/Mouth/Throat Medical History: Negative for: Chronic sinus problems/congestion; Middle ear problems Hematologic/Lymphatic Medical History: Negative for: Anemia; Hemophilia; Human Immunodeficiency Virus; Lymphedema; Sickle Cell Disease Respiratory Complaints and Symptoms: No Complaints or Symptoms Medical History: Positive for: Chronic Obstructive Pulmonary Disease (COPD) - chronic bronchitis Negative for: Aspiration; Asthma; Pneumothorax; Sleep Apnea; Tuberculosis Cardiovascular Complaints and Symptoms: No Complaints or Symptoms Medical History: Positive for: Hypertension Negative for: Angina; Arrhythmia; Congestive Heart Failure; Coronary Artery Disease; Deep Vein Thrombosis; Hypotension; Myocardial Infarction; Peripheral Arterial Disease; Peripheral Venous Disease; Phlebitis; Vasculitis Past Medical History NotesMEHER, KUCINSKI ([id number]) hypercholesteremia Gastrointestinal  Medical History: Negative for: Cirrhosis ; Colitis; Crohnos; Hepatitis A; Hepatitis B; Hepatitis C Endocrine Medical History: Positive for: Type II Diabetes Negative for: Type I Diabetes Genitourinary Medical History: Negative for: End Stage Renal Disease Immunological Medical History: Negative for: Lupus Erythematosus; Raynaudos; Scleroderma Integumentary (Skin) Medical History: Negative for: History of Burn; History of pressure wounds Musculoskeletal Medical History: Negative for: Gout; Rheumatoid Arthritis; Osteoarthritis;  Osteomyelitis Neurologic Medical History: Positive for: Neuropathy Negative for: Dementia; Quadriplegia; Paraplegia; Seizure Disorder Past Medical History Notes: CVA in history but patient denies Oncologic Medical History: Negative for: Received Chemotherapy; Received Radiation Psychiatric Complaints and Symptoms: No Complaints or Symptoms Medical History: Negative for: Anorexia/bulimia; Confinement Anxiety Immunizations Pneumococcal Vaccine: Received Pneumococcal Vaccination: Yes DELESIA, MARTINEK A. ([id number]) Implantable Devices Family and Social History Cancer: Yes - Mother; Diabetes: No; Heart Disease: Yes - Father; Hereditary Spherocytosis: No; Hypertension: No; Kidney Disease: No; Lung Disease: Yes - Mother; Seizures: No; Stroke: No; Thyroid Problems: No; Tuberculosis: No; Former smoker; Marital Status - Widowed; Alcohol Use: Rarely; Drug Use: No History; Caffeine Use: Moderate; Financial Concerns: No; Food, Clothing or Shelter Needs: No; Support System Lacking: No; Transportation Concerns: No; Advanced Directives: No; Patient does not want information on Advanced Directives Physician Affirmation I have reviewed and agree with the above information. Electronic Signature(s) Signed: 11/14/2017 5:05:24 PM By: Curtis Sites Signed: 11/17/2017 8:05:21 AM By: Lenda Kelp PA-C Entered By: Lenda Kelp on 11/14/2017 15:28:17 Dolby, [name] ([id number]) -------------------------------------------------------------------------------- SuperBill Details Patient Name: [name] A. Date of Service: 11/14/2017 Medical Record Number: [id number] Patient Account Number: [id number] Date of Birth/Sex: 03-22-1956 (62 y.o. 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 L40.9 Psoriasis, unspecified Facility Procedures CPT4 Code: [id number] Description: 11042 - DEB SUBQ TISSUE 20 SQ CM/< ICD-10 Diagnosis Description L97.212 Non-pressure chronic ulcer of right calf with fat layer exp L97.322 Non-pressure chronic ulcer of left ankle with fat layer exp Modifier: osed osed Quantity: 1 Physician Procedures CPT4 Code: [id number] Description: 11042 - WC PHYS SUBQ TISS 20 SQ CM ICD-10 Diagnosis Description L97.212 Non-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

## 2017-11-27 NOTE — Progress Notes (Signed)
[name], [name] ([id number]) Visit Report for 11/14/2017 Arrival Information Details Patient Name: [name], [name] Date of Service: 11/14/2017 2:45 PM Medical Record Number: [id number] Patient Account Number: [id number] Date of Birth/Sex: June 28, 1955 (62 y.o. F) Treating RN: Renne CriglerFlinchum, Cheryl Primary Care Tracye Szuch: Leotis ShamesSINGH, JASMINE Other Clinician: Referring Cambri Plourde: Leotis ShamesSINGH, JASMINE Treating Mende Biswell/Extender: STONE III, HOYT Weeks in Treatment: 5 Visit Information History Since Last Visit All ordered tests and consults were completed: No Patient Arrived: Ambulatory Added or deleted any medications: No Arrival Time: 14:53 Any new allergies or adverse reactions: No Accompanied By: self Had a fall or experienced change in No Transfer Assistance: None activities of daily living that may affect Patient Identification Verified: Yes risk of falls: Secondary Verification Process Completed: Yes Signs or symptoms of abuse/neglect since last visito No Patient Has Alerts: Yes Hospitalized since last visit: No Patient Alerts: DMII Implantable device outside of the clinic excluding No cellular tissue based products placed in the center since last visit: Pain Present Now: Yes Electronic Signature(s) Signed: 11/14/2017 4:21:17 PM By: Renne CriglerFlinchum, Cheryl Entered By: Renne CriglerFlinchum, Cheryl on 11/14/2017 14:53:29 Uhde, [name] ([id number]) -------------------------------------------------------------------------------- Encounter Discharge Information Details Patient Name: [name], [name] Date of Service: 11/14/2017 2:45 PM Medical Record Number: [id number] Patient Account Number: [id number] Date of Birth/Sex: June 28, 1955 (62 y.o. F) Treating RN: Huel CoventryWoody, Kim Primary Care Natalye Kott: Leotis ShamesSINGH, JASMINE Other Clinician: Referring Field Staniszewski: Leotis ShamesSINGH, JASMINE Treating Viviene Thurston/Extender: Linwood DibblesSTONE III, HOYT Weeks in Treatment: 5 Encounter Discharge Information Items Discharge Condition: Stable Ambulatory Status:  Ambulatory Discharge Destination: Home Transportation: Private Auto Accompanied By: self Schedule Follow-up Appointment: Yes Clinical Summary of Care: Electronic Signature(s) Signed: 11/14/2017 3:32:51 PM By: Elliot GurneyWoody, BSN, RN, CWS, Kim RN, BSN Entered By: Elliot GurneyWoody, BSN, RN, CWS, Kim on 11/14/2017 15:32:51 Stong, [name] ([id number]) -------------------------------------------------------------------------------- Lower Extremity Assessment Details Patient Name: [name], [name] Date of Service: 11/14/2017 2:45 PM Medical Record Number: [id number] Patient Account Number: [id number] Date of Birth/Sex: June 28, 1955 (62 y.o. F) Treating RN: Renne CriglerFlinchum, Cheryl Primary Care Astaria Nanez: Leotis ShamesSINGH, JASMINE Other Clinician: Referring Illeana Edick: Leotis ShamesSINGH, JASMINE Treating Jeorge Reister/Extender: STONE III, HOYT Weeks in Treatment: 5 Edema Assessment Assessed: [Left: No] [Right: No] Edema: [Left: No] [Right: No] Calf Left: Right: Point of Measurement: 30 cm From Medial Instep 31 cm 31.5 cm Ankle Left: Right: Point of Measurement: 12 cm From Medial Instep 18 cm 18.5 cm Vascular Assessment Claudication: Claudication Assessment [Left:None] [Right:None] Pulses: Dorsalis Pedis Palpable: [Left:Yes] [Right:Yes] Posterior Tibial Extremity colors, hair growth, and conditions: Extremity Color: [Left:Hyperpigmented] [Right:Hyperpigmented] Hair Growth on Extremity: [Left:No] [Right:No] Temperature of Extremity: [Left:Warm] [Right:Warm] Capillary Refill: [Left:< 3 seconds] [Right:< 3 seconds] Toe Nail Assessment Left: Right: Thick: No No Discolored: No No Deformed: No No Improper Length and Hygiene: No No Electronic Signature(s) Signed: 11/14/2017 4:21:17 PM By: Renne CriglerFlinchum, Cheryl Entered By: Renne CriglerFlinchum, Cheryl on 11/14/2017 15:11:16 [name], [name] ([id number]) -------------------------------------------------------------------------------- Multi Wound Chart Details Patient Name: [name], [name] Date of Service:  11/14/2017 2:45 PM Medical Record Number: [id number] Patient Account Number: [id number] Date of Birth/Sex: June 28, 1955 (62 y.o. F) Treating RN: Curtis Sitesorthy, Joanna Primary Care Philipe Laswell: Leotis ShamesSINGH, JASMINE Other Clinician: Referring Barby Colvard: Leotis ShamesSINGH, JASMINE Treating Jaleen Grupp/Extender: STONE III, HOYT Weeks in Treatment: 5 Vital Signs Height(in): 61 Pulse(bpm): 70 Weight(lbs): 157 Blood Pressure(mmHg): 116/58 Body Mass Index(BMI): 30 Temperature(F): 98.1 Respiratory Rate 16 (breaths/min): Photos: [1:No Photos] [2:No Photos] [N/A:N/A] Wound Location: [1:Right Lower Leg - Anterior] [2:Left Lower Leg - Distal] [N/A:N/A] Wounding Event: [1:Trauma] [2:Gradually Appeared] [N/A:N/A] Primary Etiology: [1:Diabetic Wound/Ulcer of the Lower Extremity] [2:Diabetic Wound/Ulcer of the Lower Extremity] [N/A:N/A]  Comorbid History: [1:Chronic Obstructive Pulmonary Disease (COPD), Hypertension, Type II Diabetes, Neuropathy] [2:Chronic Obstructive Pulmonary Disease (COPD), Hypertension, Type II Diabetes, Neuropathy] [N/A:N/A] Date Acquired: [1:09/25/2017] [2:10/10/2017] [N/A:N/A] Weeks of Treatment: [1:5] [2:4] [N/A:N/A] Wound Status: [1:Open] [2:Open] [N/A:N/A] Measurements L x W x D [1:5x1.2x0.2] [2:0.6x0.3x0.1] [N/A:N/A] (cm) Area (cm) : [1:4.712] [2:0.141] [N/A:N/A] Volume (cm) : [1:0.942] [2:0.014] [N/A:N/A] % Reduction in Area: [1:-71.40%] [2:85.00%] [N/A:N/A] % Reduction in Volume: [1:-242.50%] [2:85.10%] [N/A:N/A] Classification: [1:Grade 1] [2:Grade 2] [N/A:N/A] Exudate Amount: [1:Large] [2:None Present] [N/A:N/A] Exudate Type: [1:Purulent] [2:N/A] [N/A:N/A] Exudate Color: [1:yellow, brown, green] [2:N/A] [N/A:N/A] Wound Margin: [1:Flat and Intact] [2:Distinct, outline attached] [N/A:N/A] Granulation Amount: [1:Medium (34-66%)] [2:None Present (0%)] [N/A:N/A] Granulation Quality: [1:Red] [2:N/A] [N/A:N/A] Necrotic Amount: [1:Medium (34-66%)] [2:None Present (0%)] [N/A:N/A] Necrotic Tissue:  [1:Eschar, Adherent Slough] [2:N/A] [N/A:N/A] Exposed Structures: [1:Fat Layer (Subcutaneous Tissue) Exposed: Yes Fascia: No Tendon: No Muscle: No Joint: No Bone: No] [2:Fat Layer (Subcutaneous Tissue) Exposed: Yes Fascia: No Tendon: No Muscle: No Joint: No Bone: No] [N/A:N/A] Epithelialization: [1:Small (1-33%)] [2:None] [N/A:N/A] Periwound Skin Texture: [1:Excoriation: No Induration: No] [2:Excoriation: No Induration: No] [N/A:N/A] Callus: No Callus: No Crepitus: No Crepitus: No Rash: No Rash: No Scarring: No Scarring: No Periwound Skin Moisture: Maceration: No Maceration: No N/A Dry/Scaly: No Dry/Scaly: No Periwound Skin Color: Atrophie Blanche: No Atrophie Blanche: No N/A Cyanosis: No Cyanosis: No Ecchymosis: No Ecchymosis: No Erythema: No Erythema: No Hemosiderin Staining: No Hemosiderin Staining: No Mottled: No Mottled: No Pallor: No Pallor: No Rubor: No Rubor: No Temperature: No Abnormality No Abnormality N/A Tenderness on Palpation: Yes Yes N/A Wound Preparation: Ulcer Cleansing: Ulcer Cleansing: N/A Rinsed/Irrigated with Saline Rinsed/Irrigated with Saline Topical Anesthetic Applied: Topical Anesthetic Applied: Other: lidocaine 4% Other: lidocaine 4% Treatment Notes Electronic Signature(s) Signed: 11/14/2017 5:05:24 PM By: Curtis Sites Entered By: Curtis Sites on 11/14/2017 15:20:23 Blanck, [name] ([id number]) -------------------------------------------------------------------------------- Multi-Disciplinary Care Plan Details Patient Name: Kayla Keel A. Date of Service: 11/14/2017 2:45 PM Medical Record Number: [id number] Patient Account Number: [id number] Date of Birth/Sex: [birth year] (62 y.o. F) Treating RN: Curtis Sites Primary Care Dayten Juba: Leotis Shames Other Clinician: Referring Sande Pickert: Leotis Shames Treating Ivon Oelkers/Extender: STONE III, HOYT Weeks in Treatment: 5 Active Inactive ` Abuse / Safety / Falls / Self Care  Management Nursing Diagnoses: Potential for falls Goals: Patient will not experience any injury related to falls Date Initiated: 10/09/2017 Target Resolution Date: 02/07/2018 Goal Status: Active Interventions: Assess Activities of Daily Living upon admission and as needed Assess fall risk on admission and as needed Assess: immobility, friction, shearing, incontinence upon admission and as needed Notes: ` Nutrition Nursing Diagnoses: Imbalanced nutrition Impaired glucose control: actual or potential Potential for alteratiion in Nutrition/Potential for imbalanced nutrition Goals: Patient/caregiver agrees to and verbalizes understanding of need to use nutritional supplements and/or vitamins as prescribed Date Initiated: 10/09/2017 Target Resolution Date: 01/03/2018 Goal Status: Active Patient/caregiver will maintain therapeutic glucose control Date Initiated: 10/09/2017 Target Resolution Date: 02/07/2018 Goal Status: Active Interventions: Assess patient nutrition upon admission and as needed per policy Provide education on elevated blood sugars and impact on wound healing Provide education on nutrition Notes: ` Orientation to the Wound Care Program KEIASHA, DIEP ([id number]) Nursing Diagnoses: Knowledge deficit related to the wound healing center program Goals: Patient/caregiver will verbalize understanding of the Wound Healing Center Program Date Initiated: 10/09/2017 Target Resolution Date: 11/08/2017 Goal Status: Active Interventions: Provide education on orientation to the wound center Notes: ` Pain, Acute or Chronic Nursing Diagnoses: Pain, acute or chronic: actual or potential Potential alteration in comfort,  pain Goals: Patient/caregiver will verbalize adequate pain control between visits Date Initiated: 10/09/2017 Target Resolution Date: 01/03/2018 Goal Status: Active Interventions: Complete pain assessment as per visit requirements Encourage patient to take  pain medications as prescribed Notes: ` Wound/Skin Impairment Nursing Diagnoses: Impaired tissue integrity Knowledge deficit related to ulceration/compromised skin integrity Goals: Ulcer/skin breakdown will have a volume reduction of 80% by week 12 Date Initiated: 10/09/2017 Target Resolution Date: 01/03/2018 Goal Status: Active Interventions: Assess patient/caregiver ability to perform ulcer/skin care regimen upon admission and as needed Assess ulceration(s) every visit Notes: Electronic Signature(s) Signed: 11/14/2017 5:05:24 PM By: Curtis Sites Entered By: Curtis Sites on 11/14/2017 15:20:15 Ponds, [name] ([id number]) Scarberry, Jessye A. ([id number]) -------------------------------------------------------------------------------- Non-Wound Condition Assessment Details Patient Name: Kayla Keel A. Date of Service: 11/14/2017 2:45 PM Medical Record Number: [id number] Patient Account Number: [id number] Date of Birth/Sex: 14-Jan-1956 (63 y.o. F) Treating RN: [name] Primary Care Douglass Dunshee: Leotis Shames Other Clinician: Referring Treylen Gibbs: Leotis Shames Treating Cable Fearn/Extender: STONE III, HOYT Weeks in Treatment: 5 Non-Wound Condition: Condition: Rash / Dermatitis Location: Foot Side: Right Photos Periwound Skin Texture Texture Color No Abnormalities Noted: No No Abnormalities Noted: No Friable: Yes Erythema: Yes Rash: Yes Erythema Location: Circumferential Moisture Temperature / Pain No Abnormalities Noted: No Temperature: No Abnormality Dry / Scaly: Yes Tenderness on Palpation: Yes Maceration: Yes Notes 12 x 14 red dry scally area on right foot with scattered white pus areas noted which are closed Electronic Signature(s) Signed: 11/14/2017 4:21:17 PM By: [name] Entered By: [name] on 11/14/2017 16:19:35 Rinke, [name] ([id number]) -------------------------------------------------------------------------------- Pain Assessment  Details Patient Name: Kayla Keel A. Date of Service: 11/14/2017 2:45 PM Medical Record Number: [id number] Patient Account Number: [id number] Date of Birth/Sex: 1956/03/30 (62 y.o. F) Treating RN: [name] Primary Care Malikah Principato: Leotis Shames Other Clinician: Referring Marvette Schamp: Leotis Shames Treating Symeon Puleo/Extender: STONE III, HOYT Weeks in Treatment: 5 Active Problems Location of Pain Severity and Description of Pain Patient Has Paino Yes Site Locations Duration of the Pain. Constant / Intermittento Constant Rate the pain. Current Pain Level: 5 Character of Pain Describe the Pain: Burning, Throbbing Pain Management and Medication Current Pain Management: Electronic Signature(s) Signed: 11/14/2017 4:21:17 PM By: [name] Entered By: [name] on 11/14/2017 14:53:43 Labell, [name] ([id number]) -------------------------------------------------------------------------------- Patient/Caregiver Education Details Patient Name: Kayla Keel A. Date of Service: 11/14/2017 2:45 PM Medical Record Number: [id number] Patient Account Number: [id number] Date of Birth/Gender: 24-Feb-1956 (62 y.o. F) Treating RN: Huel Coventry Primary Care Physician: Leotis Shames Other Clinician: Referring Physician: Leotis Shames Treating Physician/Extender: Skeet Simmer in Treatment: 5 Education Assessment Education Provided To: Patient Education Topics Provided Wound/Skin Impairment: Handouts: Caring for Your Ulcer, Other: continue wound care as prescribed Methods: Demonstration Responses: State content correctly Electronic Signature(s) Signed: 11/14/2017 6:19:42 PM By: Elliot Gurney, BSN, RN, CWS, Kim RN, BSN Entered By: Elliot Gurney, BSN, RN, CWS, Kim on 11/14/2017 15:33:24 Wilbanks, [name] ([id number]) -------------------------------------------------------------------------------- Wound Assessment Details Patient Name: Kayla Keel A. Date of Service: 11/14/2017 2:45  PM Medical Record Number: [id number] Patient Account Number: [id number] Date of Birth/Sex: December 06, 1955 (62 y.o. F) Treating RN: [name] Primary Care Aslan Himes: Leotis Shames Other Clinician: Referring Orpah Hausner: Leotis Shames Treating Erin Uecker/Extender: STONE III, HOYT Weeks in Treatment: 5 Wound Status Wound Number: 1 Primary Diabetic Wound/Ulcer of the Lower Extremity Etiology: Wound Location: Right Lower Leg - Anterior Wound Open Wounding Event: Trauma Status: Date Acquired: 09/25/2017 Comorbid Chronic Obstructive Pulmonary Disease Weeks Of Treatment: 5 History: (COPD), Hypertension, Type II Diabetes, Clustered Wound:  No Neuropathy Photos Photo Uploaded By: [name] on 11/14/2017 16:17:09 Wound Measurements Length: (cm) 5 Width: (cm) 1.2 Depth: (cm) 0.2 Area: (cm) 4.712 Volume: (cm) 0.942 % Reduction in Area: -71.4% % Reduction in Volume: -242.5% Epithelialization: Small (1-33%) Tunneling: No Undermining: No Wound Description Classification: Grade 1 Wound Margin: Flat and Intact Exudate Amount: Large Exudate Type: Purulent Exudate Color: yellow, brown, green Foul Odor After Cleansing: No Slough/Fibrino Yes Wound Bed Granulation Amount: Medium (34-66%) Exposed Structure Granulation Quality: Red Fascia Exposed: No Necrotic Amount: Medium (34-66%) Fat Layer (Subcutaneous Tissue) Exposed: Yes Necrotic Quality: Eschar, Adherent Slough Tendon Exposed: No Muscle Exposed: No Joint Exposed: No Bone Exposed: No Periwound Skin Texture Warga, Donnice A. ([id number]) Texture Color No Abnormalities Noted: No No Abnormalities Noted: No Callus: No Atrophie Blanche: No Crepitus: No Cyanosis: No Excoriation: No Ecchymosis: No Induration: No Erythema: No Rash: No Hemosiderin Staining: No Scarring: No Mottled: No Pallor: No Moisture Rubor: No No Abnormalities Noted: No Dry / Scaly: No Temperature / Pain Maceration: No Temperature: No  Abnormality Tenderness on Palpation: Yes Wound Preparation Ulcer Cleansing: Rinsed/Irrigated with Saline Topical Anesthetic Applied: Other: lidocaine 4%, Treatment Notes Wound #1 (Right, Anterior Lower Leg) 1. Cleansed with: Clean wound with Normal Saline 2. Anesthetic Topical Lidocaine 4% cream to wound bed prior to debridement 3. Peri-wound Care: Skin Prep 4. Dressing Applied: Santyl Ointment Saline moistened guaze 5. Secondary Dressing Applied Dry Gauze [place] Notes Left prisma and Therapist, art) Signed: 11/14/2017 4:21:17 PM By: [name] Entered By: [name] on 11/14/2017 15:07:38 Giller, [name] ([id number]) -------------------------------------------------------------------------------- Wound Assessment Details Patient Name: Kayla Keel A. Date of Service: 11/14/2017 2:45 PM Medical Record Number: [id number] Patient Account Number: [id number] Date of Birth/Sex: [birth year] (62 y.o. F) Treating RN: [name] Primary Care Kinda Pottle: Leotis Shames Other Clinician: Referring Adylee Leonardo: Leotis Shames Treating Gabriellia Rempel/Extender: STONE III, HOYT Weeks in Treatment: 5 Wound Status Wound Number: 2 Primary Diabetic Wound/Ulcer of the Lower Extremity Etiology: Wound Location: Left Lower Leg - Distal Wound Open Wounding Event: Gradually Appeared Status: Date Acquired: 10/10/2017 Comorbid Chronic Obstructive Pulmonary Disease Weeks Of Treatment: 4 History: (COPD), Hypertension, Type II Diabetes, Clustered Wound: No Neuropathy Photos Photo Uploaded By: [name] on 11/14/2017 16:17:10 Wound Measurements Length: (cm) 0.6 Width: (cm) 0.3 Depth: (cm) 0.1 Area: (cm) 0.141 Volume: (cm) 0.014 % Reduction in Area: 85% % Reduction in Volume: 85.1% Epithelialization: None Tunneling: No Undermining: No Wound Description Classification: Grade 2 Wound Margin: Distinct, outline attached Exudate Amount: None  Present Foul Odor After Cleansing: No Slough/Fibrino No Wound Bed Granulation Amount: None Present (0%) Exposed Structure Necrotic Amount: None Present (0%) Fascia Exposed: No Fat Layer (Subcutaneous Tissue) Exposed: Yes Tendon Exposed: No Muscle Exposed: No Joint Exposed: No Bone Exposed: No Periwound Skin Texture Texture Color No Abnormalities Noted: No No Abnormalities Noted: No Consalvo, Agapita A. ([id number]) Callus: No Atrophie Blanche: No Crepitus: No Cyanosis: No Excoriation: No Ecchymosis: No Induration: No Erythema: No Rash: No Hemosiderin Staining: No Scarring: No Mottled: No Pallor: No Moisture Rubor: No No Abnormalities Noted: No Dry / Scaly: No Temperature / Pain Maceration: No Temperature: No Abnormality Tenderness on Palpation: Yes Wound Preparation Ulcer Cleansing: Rinsed/Irrigated with Saline Topical Anesthetic Applied: Other: lidocaine 4%, Treatment Notes Wound #2 (Left, Distal Lower Leg) 1. Cleansed with: Clean wound with Normal Saline 2. Anesthetic Topical Lidocaine 4% cream to wound bed prior to debridement 3. Peri-wound Care: Skin Prep 4. Dressing Applied: Santyl Ointment Saline moistened guaze 5. Secondary Dressing Applied Dry  Gauze [place] Notes Left prisma and Therapist, art) Signed: 11/14/2017 4:21:17 PM By: [name] Entered By: [name] on 11/14/2017 15:08:31 Apt, [name] ([id number]) -------------------------------------------------------------------------------- Vitals Details Patient Name: Kayla Keel A. Date of Service: 11/14/2017 2:45 PM Medical Record Number: [id number] Patient Account Number: [id number] Date of Birth/Sex: Jul 13, 1955 (62 y.o. F) Treating RN: [name] Primary Care Francisco Ostrovsky: Leotis Shames Other Clinician: Referring Gregg Holster: Leotis Shames Treating Britini Garcilazo/Extender: STONE III, HOYT Weeks in Treatment: 5 Vital Signs Time Taken: 14:57 Temperature  (F): 98.1 Height (in): 61 Pulse (bpm): 70 Weight (lbs): 157 Respiratory Rate (breaths/min): 16 Body Mass Index (BMI): 29.7 Blood Pressure (mmHg): 116/58 Reference Range: 80 - 120 mg / dl Electronic Signature(s) Signed: 11/14/2017 4:21:17 PM By: [name] Entered By: [name] on 11/14/2017 14:57:13

## 2017-12-05 ENCOUNTER — Encounter: Payer: Medicare Other | Attending: Physician Assistant | Admitting: Physician Assistant

## 2017-12-05 DIAGNOSIS — Z87891 Personal history of nicotine dependence: Secondary | ICD-10-CM | POA: Diagnosis not present

## 2017-12-05 DIAGNOSIS — Z8249 Family history of ischemic heart disease and other diseases of the circulatory system: Secondary | ICD-10-CM | POA: Insufficient documentation

## 2017-12-05 DIAGNOSIS — L97322 Non-pressure chronic ulcer of left ankle with fat layer exposed: Secondary | ICD-10-CM | POA: Insufficient documentation

## 2017-12-05 DIAGNOSIS — E11622 Type 2 diabetes mellitus with other skin ulcer: Secondary | ICD-10-CM | POA: Insufficient documentation

## 2017-12-05 DIAGNOSIS — L409 Psoriasis, unspecified: Secondary | ICD-10-CM | POA: Diagnosis not present

## 2017-12-05 DIAGNOSIS — L97212 Non-pressure chronic ulcer of right calf with fat layer exposed: Secondary | ICD-10-CM | POA: Insufficient documentation

## 2017-12-05 DIAGNOSIS — E7800 Pure hypercholesterolemia, unspecified: Secondary | ICD-10-CM | POA: Diagnosis not present

## 2017-12-05 DIAGNOSIS — I10 Essential (primary) hypertension: Secondary | ICD-10-CM | POA: Diagnosis not present

## 2017-12-05 DIAGNOSIS — Z8673 Personal history of transient ischemic attack (TIA), and cerebral infarction without residual deficits: Secondary | ICD-10-CM | POA: Diagnosis not present

## 2017-12-07 NOTE — Progress Notes (Signed)
[name], [name] ([id number]) Visit Report for 12/05/2017 Chief Complaint Document Details Patient Name: [name], [name]. Date of Service: 12/05/2017 9:30 AM Medical Record Number: [id number] Patient Account Number: [id number] Date of Birth/Sex: 08-20-1955 (62 y.o. F) Treating RN: Curtis Sites Primary Care Provider: Leotis Shames Other Clinician: Referring Provider: Leotis Shames Treating Provider/Extender: Linwood Dibbles, HOYT Weeks in Treatment: 8 Information Obtained from: Patient Chief Complaint Left lower extremity ulcers Electronic Signature(s) Signed: 12/07/2017 9:18:35 AM By: Lenda Kelp PA-C Entered By: Lenda Kelp on 12/05/2017 09:39:53 Valtierra, [name] ([id number]) -------------------------------------------------------------------------------- Debridement Details Patient Name: Kayla Keel [name]. Date of Service: 12/05/2017 9:30 AM Medical Record Number: [id number] Patient Account Number: [id number] Date of Birth/Sex: 1956/01/26 (62 y.o. F) Treating RN: Curtis Sites Primary Care Provider: Leotis Shames Other Clinician: Referring Provider: Leotis Shames Treating Provider/Extender: STONE III, HOYT Weeks in Treatment: 8 Debridement Performed for Wound #1 Right,Anterior Lower Leg Assessment: Performed By: Physician STONE III, HOYT E., PA-C Debridement Type: Debridement Severity of Tissue Pre Fat layer exposed Debridement: Pre-procedure Verification/Time Yes - 09:41 Out Taken: Start Time: 09:41 Pain Control: Lidocaine 4% Topical Solution Total Area Debrided (L x W): 3.9 (cm) x 0.9 (cm) = 3.51 (cm) Tissue and other material Viable, Non-Viable, Slough, Subcutaneous, Slough debrided: Level: Skin/Subcutaneous Tissue Debridement Description: Excisional Instrument: Curette Bleeding: Minimum Hemostasis Achieved: Pressure End Time: 09:43 Procedural Pain: 0 Post Procedural Pain: 0 Response to Treatment: Procedure was tolerated well Level of Consciousness: Awake  and Alert Post Debridement Measurements of Total Wound Length: (cm) 3.9 Width: (cm) 0.9 Depth: (cm) 0.2 Volume: (cm) 0.551 Character of Wound/Ulcer Post Debridement: Improved Severity of Tissue Post Debridement: Fat layer exposed Post Procedure Diagnosis Same as Pre-procedure Electronic Signature(s) Signed: 12/05/2017 5:28:07 PM By: Curtis Sites Signed: 12/07/2017 9:18:35 AM By: Lenda Kelp PA-C Entered By: Curtis Sites on 12/05/2017 09:43:35 Phegley, [name] ([id number]) -------------------------------------------------------------------------------- HPI Details Patient Name: Kayla Keel [name]. Date of Service: 12/05/2017 9:30 AM Medical Record Number: [id number] Patient Account Number: [id number] Date of Birth/Sex: April 12, 1955 (62 y.o. F) Treating RN: Curtis Sites Primary Care Provider: Leotis Shames Other Clinician: Referring Provider: Leotis Shames Treating Provider/Extender: Linwood Dibbles, HOYT Weeks in Treatment: 8 History of Present Illness HPI Description: 10/09/17-She presents today as an initial evaluation for [name] right pretibial ulcer. She states approximately 3 weeks ago she fell, lacerating the anterior aspect of her right lower shin. She states she was prescribed Levaquin which she completed last week. She states she has been applying steroid cream to this area with no noted improvement; is eschar covered. She denies pain to this area. 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 She states she has seen [facility] dermatology in [place] who has prescribed clobetasol ointment with no noted improvement. She has not been applying an occlusive dressing after the application of steroid cream. She states there is no known exacerbating or relieving factors. She does complain of pain, pruritus and admits that presentation today is significantly better than they flareup she  had last week. 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 We have requested notes from dermatology and will review those. She does state she has had [name] biopsy to the right foot. Overall, she is [name] poor historian 10/17/17 on evaluation today the patient whom I have not seen previously but has been in our clinic appears to be doing fairly well at this point in time. She has been  tolerating the dressing changes without complication. With that being said I do not believe she's been using the amount of Santyl that is required in order to actually see the necrotic tissue softened at this point. Also think utilizing [name] Kerlex wrap as opposed to an inclusive Boarder Foam Dressing may be slowing things down because were not retaining enough moisture for the Santyl to be completely effective. With that being said I think both of these issues do need to be addressed at this point. In general however she still continues to have [name] lot of discomfort. 10/27/17 on evaluation today patient appears to actually be doing very well in regard to her wounds and that they are starting to dramatically loosen up in regard to the eschar and [facility] covering the surface of the wound. She has been tolerating the dressing changes without complication up to this point. She does have discomfort although it's not as bad she definitely states that when I'm cleaning the wounds it's much worse. 11/05/17-She is seen in follow-up evaluation for right pretibial and left anterior ankle wound. These are stable in appearance; she does not tolerate debridement attempt. We will continue with Santyl and she will follow-up next week 11/14/17 on evaluation today patient's wounds actually seem to be doing better in fact her left lower Trinity wound actually appears to be almost completely healed. With that being said she's been tolerating the dressing changes without complication. There does not appear to be any evidence of infection at this  time. 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 11/25/17 on evaluation today patient's wound on the ankle region has resolved at this point. Appears to be doing very well. With that being said the lower extremity ulcer on the anterior portion of her leg seems to be doing much better to me. She is much less slough than was previously noted which is good news. With that being said she still have some discomfort at the site there still some Slough noted. No fevers, chills, nausea, or vomiting noted at this time. 12/05/17 on evaluation today patient continues to do very well in regard to her right anterior lower extremity ulcer. She's been tolerating the dressing changes without complication. Fortunately there does not appear to be evidence of infection which is good news. Overall I do feel like she's making good signs of progress at this point. Electronic Signature(s) Signed: 12/07/2017 9:18:35 AM By: Lenda Kelp PA-C Entered By: Lenda Kelp on 12/05/2017 09:45:11 [name], [name] ([id number]) [name], [name] ([id number]) -------------------------------------------------------------------------------- Physical Exam Details Patient Name: Kayla Keel [name]. Date of Service: 12/05/2017 9:30 AM Medical Record Number: [id number] Patient Account Number: [id number] Date of Birth/Sex: [birth year] (62 y.o. F) Treating RN: Curtis Sites Primary Care Provider: Leotis Shames Other Clinician: Referring Provider: Leotis Shames Treating Provider/Extender: STONE III, HOYT Weeks in Treatment: 8 Constitutional Well-nourished and well-hydrated in no acute distress. Respiratory normal breathing without difficulty. Psychiatric this patient is able to make decisions and demonstrates good insight into disease  process. Alert and Oriented x 3. pleasant and cooperative. Notes Patient's left does seem to be doing much better on the surface of the wound fortunately there is no evidence of worsening in that regard and she has [name] lot of new epithelialization. This point my  suggestion is going to be that we continue with the Santyl to keep things loose and and from building up as far as the slough is concerned. Nonetheless I do believe that she may be able to switch away from this at the next visit depending on how things are progressing. Today sharp debridement was performed to remove the surface slough from the wound she tolerated this without complication. Electronic Signature(s) Signed: 12/07/2017 9:18:35 AM By: Lenda Kelp PA-C Entered By: Lenda Kelp on 12/05/2017 09:45:51 [name], [name] ([id number]) -------------------------------------------------------------------------------- Physician Orders Details Patient Name: Kayla Keel [name]. Date of Service: 12/05/2017 9:30 AM Medical Record Number: [id number] Patient Account Number: [id number] Date of Birth/Sex: January 04, 1956 (62 y.o. 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 o Cleanse wound with mild soap and water o May Shower, gently pat wound dry prior to applying new dressing. Anesthetic (add to Medication List) Wound #1 Right,Anterior Lower Leg o Topical Lidocaine 4% cream applied to wound bed prior to debridement (In Clinic Only). Primary Wound  Dressing Wound #1 Right,Anterior Lower Leg o Saline moistened gauze o Santyl Ointment Secondary Dressing Wound #1 Right,Anterior Lower Leg o [place] Dressing Change Frequency Wound #1 Right,Anterior Lower Leg o Change dressing every day. - once daily Follow-up Appointments Wound #1 Right,Anterior Lower Leg o Return Appointment in 2 weeks. Edema Control Wound #1 Right,Anterior Lower Leg o Elevate legs to the level of the heart and pump ankles as often as possible Additional Orders / Instructions Wound #1 Right,Anterior Lower Leg o Increase protein intake. o Activity as tolerated Bachtell, Ikran [name]. ([id number]) Electronic Signature(s) Signed: 12/05/2017 5:28:07 PM By: Curtis Sites Signed: 12/07/2017 9:18:35 AM By: Lenda Kelp PA-C Entered By: Curtis Sites on 12/05/2017 09:42:12 [name], [name] ([id number]) -------------------------------------------------------------------------------- Problem List Details Patient Name: Kayla Keel [name]. Date of Service: 12/05/2017 9:30 AM Medical Record Number: [id number] Patient Account Number: [id number] Date of Birth/Sex: November 14, 1955 (62 y.o. 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 Date of Service: 12/05/2017 9:30 AM Medical Record Number: [id number] Patient Account Number: [id number] Date of Birth/Sex: 1955-07-22 (62 y.o. F) Treating RN: Curtis Sites Primary Care Provider: Leotis Shames Other Clinician: Referring Provider: Leotis Shames Treating Provider/Extender: Linwood Dibbles, HOYT Weeks in Treatment: 8 Subjective Chief Complaint Information obtained from Patient Left lower extremity ulcers History of Present Illness (HPI) 10/09/17-She presents today as an initial evaluation for [name] right pretibial ulcer. She states approximately 3 weeks ago she fell, lacerating the anterior aspect of her right lower shin. She states she was prescribed Levaquin  which she completed last week. She states she has been applying steroid cream to this area with no noted improvement; is eschar covered. She denies pain to this area. 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 She states she has seen [facility] dermatology in [place] who has prescribed clobetasol ointment with no noted improvement. She has not been applying an occlusive dressing after the application of steroid cream. She states there is no known exacerbating or relieving factors. She does complain of pain, pruritus and admits that presentation today is significantly better than they flareup she had last week. 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 We have requested notes from dermatology and will review those. She does state she has had [name] biopsy to the right foot. Overall, she is [name] poor historian 10/17/17 on evaluation today the patient whom I have not seen previously but has been in our clinic appears to be doing fairly well at this point in time. She has been tolerating the dressing changes without complication. With that being said I do not believe she's been  using the amount of Santyl that is required in order to actually see the necrotic tissue softened at this point. Also think utilizing [name] Kerlex wrap as opposed to an inclusive Boarder Foam Dressing may be slowing things down because were not retaining enough moisture for the Santyl to be completely effective. With that being said I think both of these issues do need to be addressed at this point. In general however she still continues to have [name] lot of discomfort. 10/27/17 on evaluation today patient appears to actually be doing very well in regard to her wounds and that they are starting to dramatically loosen up in regard to the eschar and [facility] covering the surface of the wound. She has been tolerating the dressing changes without complication up to this point. She does have discomfort although it's not as bad she definitely states that when I'm cleaning the wounds it's much worse. 11/05/17-She is seen in follow-up evaluation for right pretibial and left anterior ankle wound. These are stable in appearance; she does not tolerate debridement attempt. We will continue with Santyl and she will follow-up next week 11/14/17 on evaluation today patient's wounds actually seem to be doing better in fact her left lower Trinity wound actually appears to be almost completely healed. With that being said she's been tolerating the dressing changes without complication. There does not appear to be any evidence of infection at this time. 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 11/25/17 on evaluation today patient's wound on the ankle region has resolved at this point. Appears to be doing very well. With that being said the lower extremity ulcer on the anterior portion of her leg seems to be doing much better to me.  She is much less slough than was previously noted which is good news. With that being said she still have some discomfort at the site there still some Slough noted. No fevers, chills, nausea, or vomiting noted at this time. 12/05/17 on evaluation today patient continues to do very well in regard to her right anterior lower extremity ulcer. She's been tolerating the dressing  changes without complication. Fortunately there does not appear to be evidence of infection which is good news. Overall I do feel like she's making good signs of progress at this point. [name], [name] ([id number]) Patient History Information obtained from Patient. Family History Cancer - Mother, Heart Disease - Father, Lung Disease - Mother, No family history of Diabetes, Hereditary Spherocytosis, Hypertension, Kidney Disease, Seizures, Stroke, Thyroid Problems, Tuberculosis. Social History Former smoker, Marital Status - Widowed, Alcohol Use - Rarely, Drug Use - No History, Caffeine Use - Moderate. 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 Respiratory The patient has no complaints or symptoms. Cardiovascular The patient has no complaints or symptoms. Psychiatric The patient has no complaints or symptoms. Objective Constitutional Well-nourished and well-hydrated in no acute distress. Vitals Time Taken: 9:28 AM, Height: 61 in, Weight: 157 lbs, BMI: 29.7, Temperature: 97.9 F, Pulse: 72 bpm, Respiratory Rate: 16 breaths/min, Blood Pressure: 132/53 mmHg. Respiratory normal breathing without difficulty. Psychiatric this patient is able to make decisions and demonstrates good insight into disease process. Alert and Oriented x 3. pleasant and cooperative. General Notes: Patient's left does seem to be doing much better on the  surface of the wound fortunately there is no evidence of worsening in that regard and she has [name] lot of new epithelialization. This point my suggestion is going to be that we continue with the Santyl to keep things loose and and from building up as far as the slough is concerned. Nonetheless I do believe that she may be able to switch away from this at the next visit depending on how things are progressing. Today sharp debridement was performed to remove the surface slough from the wound she tolerated this without complication. [name], [name] Kitchen ([id number]) Integumentary (Hair, Skin) Wound #1 status is Open. Original cause of wound was Trauma. The wound is located on the Right,Anterior Lower Leg. The wound measures 3.9cm length x 0.9cm width x 0.2cm depth; 2.757cm^2 area and 0.551cm^3 volume. There is Fat Layer (Subcutaneous Tissue) Exposed exposed. There is no tunneling or undermining noted. There is [name] medium amount of serous drainage noted. The wound margin is flat and intact. There is medium (34-66%) red granulation within the wound bed. There is [name] medium (34-66%) amount of necrotic tissue within the wound bed including Eschar and Adherent Slough. The periwound skin appearance exhibited: Erythema. The periwound skin appearance did not exhibit: Callus, Crepitus, Excoriation, Induration, Rash, Scarring, Dry/Scaly, Maceration, Atrophie Blanche, Cyanosis, Ecchymosis, Hemosiderin Staining, Mottled, Pallor, Rubor. The surrounding wound skin color is noted with erythema which is circumferential. Periwound temperature was noted as No Abnormality. The periwound has tenderness on palpation. 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 There was [name] Excisional Skin/Subcutaneous Tissue Debridement with [name] total area of 3.51 sq cm performed by STONE III, HOYT E., PA-C. With the following instrument(s): Curette to remove Viable and Non-Viable tissue/material. Material removed includes Subcutaneous Tissue and Slough and after achieving pain control using Lidocaine 4% Topical Solution. No specimens were taken. [name] time out was conducted at 09:41, prior to the start of the procedure. [name] Minimum  amount of bleeding was controlled with Pressure. The procedure was tolerated well with [name] pain level of 0 throughout and [name] pain level of 0 following the procedure. Patient s Level of Consciousness post procedure was recorded as Awake and Alert. Post Debridement Measurements: 3.9cm length x 0.9cm width x 0.2cm depth; 0.551cm^3 volume. Character of Wound/Ulcer Post Debridement is improved. Severity of Tissue Post Debridement is: Fat layer exposed. Post procedure Diagnosis Wound #1: Same as Pre-Procedure Plan Wound Cleansing: Wound #1 Right,Anterior Lower Leg: Clean wound with Normal Saline. Cleanse wound with mild soap and water May Shower, gently pat wound dry prior to applying new dressing. Anesthetic (add to Medication List): Wound #1 Right,Anterior Lower Leg: [name], [name]. ([id number]) Topical Lidocaine 4% cream applied to wound bed prior to debridement (In Clinic Only). Primary Wound Dressing: Wound #1 Right,Anterior Lower Leg: Saline moistened gauze Santyl Ointment Secondary Dressing: Wound #1 Right,Anterior Lower Leg: [place] Dressing Change Frequency: Wound #1 Right,Anterior Lower Leg: Change dressing every day. - once daily Follow-up Appointments: Wound #1 Right,Anterior Lower Leg: Return Appointment in 2 weeks. Edema Control: Wound #1 Right,Anterior Lower Leg: Elevate legs to the level of the heart and pump ankles as often as  possible Additional Orders / Instructions: Wound #1 Right,Anterior Lower Leg: Increase protein intake. Activity as tolerated I'm gonna recommend that we see the patient back for reevaluation in two weeks time to see were things stand she's in agreement the plan. Anything changes or worsens in the meantime she will contact the office for additional recommendations. Otherwise hopefully she will continue to make good progress. Please see above for specific wound care orders. We will see patient for re-evaluation in 2 week(s) here in the clinic. If anything worsens or changes patient will contact our office for additional recommendations. Electronic Signature(s) Signed: 12/07/2017 9:18:35 AM By: Lenda Kelp PA-C Entered By: Lenda Kelp on 12/05/2017 09:46:26 Mcjunkin, [name] ([id number]) -------------------------------------------------------------------------------- ROS/PFSH Details Patient Name: Kayla Keel [name]. Date of Service: 12/05/2017 9:30 AM Medical Record Number: [id number] Patient Account Number: [id number] Date of Birth/Sex: 1955/07/28 (62 y.o. F) Treating RN: Curtis Sites Primary Care Provider: Leotis Shames Other Clinician: Referring Provider: Leotis Shames Treating Provider/Extender: STONE III, HOYT Weeks in Treatment: 8 Information Obtained From Patient Wound History Do you currently have one or more open woundso Yes Has your wound(s) ever healed and then re-openedo No Have you had any lab work done in the past montho No Have you tested positive for an antibiotic resistant organism (MRSA, VRE)o No Have you tested positive for osteomyelitis (bone infection)o No Have you had any tests for circulation on your legso No Constitutional Symptoms (General Health) Complaints and Symptoms: Negative for: Fever; Chills Eyes Medical History: Negative for: Cataracts; Glaucoma; Optic Neuritis Ear/Nose/Mouth/Throat Medical History: Negative for: Chronic sinus  problems/congestion; Middle ear problems Hematologic/Lymphatic Medical History: Negative for: Anemia; Hemophilia; Human Immunodeficiency Virus; Lymphedema; Sickle Cell Disease Respiratory Complaints and Symptoms: No Complaints or Symptoms Medical History: Positive for: Chronic Obstructive Pulmonary Disease (COPD) - chronic bronchitis Negative for: Aspiration; Asthma; Pneumothorax; Sleep Apnea; Tuberculosis Cardiovascular Complaints and Symptoms: No Complaints or Symptoms Medical History: Positive for: Hypertension Negative for: Angina; Arrhythmia; Congestive Heart Failure; Coronary Artery Disease; Deep Vein Thrombosis; Hypotension; Myocardial Infarction; Peripheral Arterial Disease; Peripheral Venous Disease; Phlebitis; Vasculitis Past Medical History NotesLYLIA, KARN ([id number]) hypercholesteremia Gastrointestinal Medical History: Negative for: Cirrhosis ; Colitis; Crohnos; Hepatitis [name]; Hepatitis B; Hepatitis C Endocrine Medical History: Positive for: Type II Diabetes Negative for: Type I Diabetes  Genitourinary Medical History: Negative for: End Stage Renal Disease Immunological Medical History: Negative for: Lupus Erythematosus; Raynaudos; Scleroderma Integumentary (Skin) Medical History: Negative for: History of Burn; History of pressure wounds Musculoskeletal Medical History: Negative for: Gout; Rheumatoid Arthritis; Osteoarthritis; Osteomyelitis Neurologic Medical History: Positive for: Neuropathy Negative for: Dementia; Quadriplegia; Paraplegia; Seizure Disorder Past Medical History Notes: CVA in history but patient denies Oncologic Medical History: Negative for: Received Chemotherapy; Received Radiation Psychiatric Complaints and Symptoms: No Complaints or Symptoms Medical History: Negative for: Anorexia/bulimia; Confinement Anxiety Immunizations Pneumococcal Vaccine: Received Pneumococcal Vaccination: Yes [name], [name].  ([id number]) Implantable Devices Family and Social History Cancer: Yes - Mother; Diabetes: No; Heart Disease: Yes - Father; Hereditary Spherocytosis: No; Hypertension: No; Kidney Disease: No; Lung Disease: Yes - Mother; Seizures: No; Stroke: No; Thyroid Problems: No; Tuberculosis: No; Former smoker; Marital Status - Widowed; Alcohol Use: Rarely; Drug Use: No History; Caffeine Use: Moderate; Financial Concerns: No; Food, Clothing or Shelter Needs: No; Support System Lacking: No; Transportation Concerns: No; Advanced Directives: No; Patient does not want information on Advanced Directives Physician Affirmation I have reviewed and agree with the above information. Electronic Signature(s) Signed: 12/05/2017 5:28:07 PM By: Curtis Sites Signed: 12/07/2017 9:18:35 AM By: Lenda Kelp PA-C Entered By: Lenda Kelp on 12/05/2017 09:45:30 [place], [name] ([id number]) -------------------------------------------------------------------------------- SuperBill Details Patient Name: Kayla Keel [name]. Date of Service: 12/05/2017 Medical Record Number: [id number] Patient Account Number: [id number] Date of Birth/Sex: May 23, 1955 (62 y.o. 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

## 2017-12-07 NOTE — Progress Notes (Signed)
CHRISTALYNN, BOISE ([id number]) Visit Report for 12/05/2017 Arrival Information Details Patient Name: [name], [name]. Date of Service: 12/05/2017 9:30 AM Medical Record Number: [id number] Patient Account Number: [id number] Date of Birth/Sex: 1955-08-14 (62 y.o. F) Treating RN: Curtis Sites Primary Care Junelle Hashemi: Leotis Shames Other Clinician: Referring Ceylon Arenson: Leotis Shames Treating Leon Goodnow/Extender: STONE III, HOYT Weeks in Treatment: 8 Visit Information History Since Last Visit Added or deleted any medications: No Patient Arrived: Ambulatory Any new allergies or adverse reactions: No Arrival Time: 09:26 Had [name] fall or experienced change in No Accompanied By: self activities of daily living that may affect Transfer Assistance: None risk of falls: Patient Identification Verified: Yes Signs or symptoms of abuse/neglect since last visito No Secondary Verification Process Completed: Yes Hospitalized since last visit: No Patient Has Alerts: Yes Implantable device outside of the clinic excluding No Patient Alerts: DMII cellular tissue based products placed in the center since last visit: Has Dressing in Place as Prescribed: Yes Pain Present Now: No Electronic Signature(s) Signed: 12/05/2017 11:26:21 AM By: Dayton Martes RCP, RRT, CHT Entered By: Dayton Martes on 12/05/2017 09:26:46 Reish, [name] ([id number]) -------------------------------------------------------------------------------- Encounter Discharge Information Details Patient Name: Kayla Keel [name]. Date of Service: 12/05/2017 9:30 AM Medical Record Number: [id number] Patient Account Number: [id number] Date of Birth/Sex: 07-19-1955 (62 y.o. F) Treating RN: Curtis Sites Primary Care Kareema Keitt: Leotis Shames Other Clinician: Referring Taylr Meuth: Leotis Shames Treating Treylan Mcclintock/Extender: Linwood Dibbles, HOYT Weeks in Treatment: 8 Encounter Discharge Information Items Discharge Condition:  Stable Ambulatory Status: Ambulatory Discharge Destination: Home Transportation: Private Auto Accompanied By: self Schedule Follow-up Appointment: Yes Clinical Summary of Care: Electronic Signature(s) Signed: 12/05/2017 5:28:07 PM By: Curtis Sites Entered By: Curtis Sites on 12/05/2017 09:48:48 [place], [name] Kitchen ([id number]) -------------------------------------------------------------------------------- Lower Extremity Assessment Details Patient Name: Kayla Keel [name]. Date of Service: 12/05/2017 9:30 AM Medical Record Number: [id number] Patient Account Number: [id number] Date of Birth/Sex: [birth year] (62 y.o. F) Treating RN: Rema Jasmine Primary Care Kypton Eltringham: Leotis Shames Other Clinician: Referring Karan Ramnauth: Leotis Shames Treating Ab Leaming/Extender: STONE III, HOYT Weeks in Treatment: 8 Edema Assessment Assessed: [Left: No] [Right: No] Edema: [Left: N] [Right: o] Calf Left: Right: Point of Measurement: 30 cm From Medial Instep cm 30.5 cm Ankle Left: Right: Point of Measurement: 12 cm From Medial Instep cm 19 cm Vascular Assessment Claudication: Claudication Assessment [Right:None] Pulses: Dorsalis Pedis Palpable: [Right:Yes] Posterior Tibial Extremity colors, hair growth, and conditions: Extremity Color: [Right:Normal] Hair Growth on Extremity: [Right:Yes] Temperature of Extremity: [Right:Cool] Capillary Refill: [Right:< 3 seconds] Toe Nail Assessment Left: Right: Thick: No Discolored: No Deformed: No Improper Length and Hygiene: No Electronic Signature(s) Signed: 12/05/2017 4:32:43 PM By: Rema Jasmine Entered By: Rema Jasmine on 12/05/2017 09:34:05 K[place]k, [name]. ([id number]) -------------------------------------------------------------------------------- Multi Wound Chart Details Patient Name: Kayla Keel [name]. Date of Service: 12/05/2017 9:30 AM Medical Record Number: [id number] Patient Account Number: [id number] Date of Birth/Sex: [birth year] (62 y.o. 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 Comorbid History: Chronic Obstructive N/[name] N/[name] Pulmonary Disease (COPD), Hypertension, Type II Diabetes, Neuropathy Date Acquired: 09/25/2017 N/[name] N/[name] Weeks of Treatment: 8 N/[name] N/[name] Wound Status: Open N/[name] N/[name] Measurements L  x W x D 3.9x0.9x0.2 N/[name] N/[name] (cm) Area (cm) : 2.757 N/[name] N/[name] Volume (cm) : 0.551 N/[name] N/[name] % Reduction in Area: -0.30% N/[name] N/[name] % Reduction in Volume: -100.40% N/[name] N/[name] Classification: Grade 1 N/[name] N/[name] Exudate Amount: Medium N/[name] N/[name] Exudate Type: Serous N/[name] N/[name] Exudate Color: amber N/[name] N/[name] Wound Margin: Flat and Intact N/[name] N/[name] Granulation Amount: Medium (34-66%) N/[name] N/[name] Granulation Quality: Red N/[name] N/[name] Necrotic Amount: Medium (34-66%) N/[name] N/[name] Necrotic Tissue: Eschar, Adherent Slough N/[name] N/[name] Exposed Structures: Fat Layer (Subcutaneous N/[name] N/[name] Tissue) Exposed: Yes Fascia: No Salvas, Iliany [name]. ([id number]) Tendon: No Muscle: No Joint: No Bone: No Epithelialization: Small (1-33%) N/[name] N/[name] Periwound Skin Texture: Excoriation: No N/[name] N/[name] Induration: No Callus: No Crepitus: No Rash: No Scarring: No Periwound Skin Moisture: Maceration: No N/[name] N/[name] Dry/Scaly: No Periwound Skin Color: Erythema: Yes N/[name] N/[name] Atrophie Blanche: No Cyanosis: No Ecchymosis: No Hemosiderin Staining: No Mottled: No Pallor: No Rubor: No Erythema Location: Circumferential N/[name] N/[name] Temperature: No Abnormality N/[name] N/[name] Tenderness on Palpation: Yes N/[name] N/[name] Wound  Preparation: Ulcer Cleansing: N/[name] N/[name] Rinsed/Irrigated with Saline Topical Anesthetic Applied: Other: lidocaine 4% Treatment Notes Electronic Signature(s) Signed: 12/05/2017 5:28:07 PM By: Curtis Sites Entered By: Curtis Sites on 12/05/2017 09:41:53 Leitz, [name] ([id number]) -------------------------------------------------------------------------------- Multi-Disciplinary Care Plan Details Patient Name: Kayla Keel [name]. Date of Service: 12/05/2017 9:30 AM Medical Record Number: [id number] Patient Account Number: [id number] Date of Birth/Sex: June 13, 1955 (62 y.o. F) Treating RN: Curtis Sites Primary Care Sunset Joshi: Leotis Shames Other Clinician: Referring Jamese Trauger: Leotis Shames Treating Brazos Sandoval/Extender: STONE III, HOYT Weeks in Treatment: 8 Active Inactive ` Abuse / Safety / Falls / Self Care Management Nursing Diagnoses: Potential for falls Goals: Patient will not experience any injury related to falls Date Initiated: 10/09/2017 Target Resolution Date: 02/07/2018 Goal Status: Active Interventions: Assess Activities of Daily Living upon admission and as needed Assess fall risk on admission and as needed Assess: immobility, friction, shearing, incontinence upon admission and as needed Notes: ` Nutrition Nursing Diagnoses: Imbalanced nutrition Impaired glucose control: actual or potential Potential for alteratiion in Nutrition/Potential for imbalanced nutrition Goals: Patient/caregiver agrees to and verbalizes understanding of need to use nutritional supplements and/or vitamins as prescribed Date Initiated: 10/09/2017 Target Resolution Date: 01/03/2018 Goal Status: Active Patient/caregiver will maintain therapeutic glucose control Date Initiated: 10/09/2017 Target Resolution Date: 02/07/2018 Goal Status: Active Interventions: Assess patient nutrition upon admission and as needed per policy Provide education on elevated blood sugars and impact on wound  healing Provide education on nutrition Notes: ` Orientation to the Wound Care Program [name], [name] ([id number]) Nursing Diagnoses: Knowledge deficit related to the wound healing center program Goals: Patient/caregiver will verbalize understanding of the Wound Healing Center Program Date Initiated: 10/09/2017 Target Resolution Date: 11/08/2017 Goal Status: Active Interventions: Provide education on orientation to the wound center Notes: ` Pain, Acute or Chronic Nursing Diagnoses: Pain, acute or chronic: actual or potential Potential alteration in comfort, pain Goals: Patient/caregiver will verbalize adequate pain control between visits Date Initiated: 10/09/2017 Target Resolution Date: 01/03/2018 Goal Status: Active Interventions: Complete pain assessment as per visit requirements Encourage patient to take pain medications as prescribed Notes: ` Wound/Skin Impairment Nursing Diagnoses: Impaired tissue integrity Knowledge deficit related to ulceration/compromised skin integrity Goals: Ulcer/skin breakdown will have [name] volume reduction of 80% by week 12 Date Initiated: 10/09/2017 Target Resolution Date: 01/03/2018 Goal Status: Active Interventions: Assess patient/caregiver ability to perform ulcer/skin care regimen upon admission and as needed Assess ulceration(s) every visit Notes: Electronic Signature(s) Signed: 12/05/2017 5:28:07  PM By: Curtis Sites Entered By: Curtis Sites on 12/05/2017 09:41:38 [name], [name] ([id number]) Deming, [name] ([id number]) -------------------------------------------------------------------------------- Pain Assessment Details Patient Name: Kayla Keel [name]. Date of Service: 12/05/2017 9:30 AM Medical Record Number: [id number] Patient Account Number: [id number] Date of Birth/Sex: 1955-10-03 (62 y.o. F) Treating RN: Curtis Sites Primary Care Stephanieann Popescu: Leotis Shames Other Clinician: Referring Emmanuela Ghazi: Leotis Shames Treating  Dimetrius Montfort/Extender: STONE III, HOYT Weeks in Treatment: 8 Active Problems Location of Pain Severity and Description of Pain Patient Has Paino Yes Site Locations Duration of the Pain. Constant / Intermittento Constant Rate the pain. Current Pain Level: 8 Character of Pain Describe the Pain: Other: tingling Pain Management and Medication Current Pain Management: Electronic Signature(s) Signed: 12/05/2017 11:26:21 AM By: Dayton Martes RCP, RRT, CHT Signed: 12/05/2017 5:28:07 PM By: Curtis Sites Entered By: Dayton Martes on 12/05/2017 09:27:30 Sawatzky, [name] ([id number]) -------------------------------------------------------------------------------- Patient/Caregiver Education Details Patient Name: Kayla Keel [name]. Date of Service: 12/05/2017 9:30 AM Medical Record Number: [id number] Patient Account Number: [id number] Date of Birth/Gender: 02/28/1956 (62 y.o. F) Treating RN: Curtis Sites Primary Care Physician: Leotis Shames Other Clinician: Referring Physician: Leotis Shames Treating Physician/Extender: Skeet Simmer in Treatment: 8 Education Assessment Education Provided To: Patient Education Topics Provided Wound/Skin Impairment: Handouts: Other: wound care as ordered Methods: Demonstration, Explain/Verbal Responses: State content correctly Electronic Signature(s) Signed: 12/05/2017 5:28:07 PM By: Curtis Sites Entered By: Curtis Sites on 12/05/2017 09:49:03 [name], [name] Kitchen ([id number]) -------------------------------------------------------------------------------- Wound Assessment Details Patient Name: Kayla Keel [name]. Date of Service: 12/05/2017 9:30 AM Medical Record Number: [id number] Patient Account Number: [id number] Date of Birth/Sex: May 18, 1955 (62 y.o. F) Treating RN: Rema Jasmine Primary Care Dominque Levandowski: Leotis Shames Other Clinician: Referring Brayten Komar: Leotis Shames Treating Samir Ishaq/Extender: STONE III, HOYT Weeks in  Treatment: 8 Wound Status Wound Number: 1 Primary Diabetic Wound/Ulcer of the Lower Extremity Etiology: Wound Location: Right Lower Leg - Anterior Wound Open Wounding Event: Trauma Status: Date Acquired: 09/25/2017 Comorbid Chronic Obstructive Pulmonary Disease Weeks Of Treatment: 8 History: (COPD), Hypertension, Type II Diabetes, Clustered Wound: No Neuropathy Photos Photo Uploaded By: Rema Jasmine on 12/05/2017 09:36:13 Wound Measurements Length: (cm) 3.9 Width: (cm) 0.9 Depth: (cm) 0.2 Area: (cm) 2.757 Volume: (cm) 0.551 % Reduction in Area: -0.3% % Reduction in Volume: -100.4% Epithelialization: Small (1-33%) Tunneling: No Undermining: No Wound Description Classification: Grade 1 Foul Odor [name] Wound Margin: Flat and Intact Slough/Fibr Exudate Amount: Medium Exudate Type: Serous Exudate Color: amber fter Cleansing: No ino Yes Wound Bed Granulation Amount: Medium (34-66%) Exposed Structure Granulation Quality: Red Fascia Exposed: No Necrotic Amount: Medium (34-66%) Fat Layer (Subcutaneous Tissue) Exposed: Yes Necrotic Quality: Eschar, Adherent Slough Tendon Exposed: No Muscle Exposed: No Joint Exposed: No Bone Exposed: No Periwound Skin Texture [name], [name]. ([id number]) Texture Color No Abnormalities Noted: No No Abnormalities Noted: No Callus: No Atrophie Blanche: No Crepitus: No Cyanosis: No Excoriation: No Ecchymosis: No Induration: No Erythema: Yes Rash: No Erythema Location: Circumferential Scarring: No Hemosiderin Staining: No Mottled: No Moisture Pallor: No No Abnormalities Noted: No Rubor: No Dry / Scaly: No Maceration: No Temperature / Pain Temperature: No Abnormality Tenderness on Palpation: Yes Wound Preparation Ulcer Cleansing: Rinsed/Irrigated with Saline Topical Anesthetic Applied: Other: lidocaine 4%, Treatment Notes Wound #1 (Right, Anterior Lower Leg) 1. Cleansed with: Clean wound with Normal Saline 2.  Anesthetic Topical Lidocaine 4% cream to wound bed prior to debridement 4. Dressing Applied: Santyl Ointment 5. Secondary Dressing Applied Saline moistened [employer]) Signed: 12/05/2017 4:32:43 PM By: Olena Leatherwood,  Wendi Entered By: Rema Jasmine on 12/05/2017 09:32:05 [name], [name] ([id number]) -------------------------------------------------------------------------------- Vitals Details Patient Name: Kayla Keel [name]. Date of Service: 12/05/2017 9:30 AM Medical Record Number: [id number] Patient Account Number: [id number] Date of Birth/Sex: 1955/11/16 (62 y.o. F) Treating RN: Curtis Sites Primary Care Lamonica Trueba: Leotis Shames Other Clinician: Referring Nana Hoselton: Leotis Shames Treating Nuha Degner/Extender: STONE III, HOYT Weeks in Treatment: 8 Vital Signs Time Taken: 09:28 Temperature (F): 97.9 Height (in): 61 Pulse (bpm): 72 Weight (lbs): 157 Respiratory Rate (breaths/min): 16 Body Mass Index (BMI): 29.7 Blood Pressure (mmHg): 132/53 Reference Range: 80 - 120 mg / dl Electronic Signature(s) Signed: 12/05/2017 11:26:21 AM By: Dayton Martes RCP, RRT, CHT Entered By: Weyman Rodney, Lucio Edward on 12/05/2017 08:65:78

## 2017-12-19 ENCOUNTER — Encounter: Payer: Medicare Other | Admitting: Physician Assistant

## 2017-12-19 DIAGNOSIS — E11622 Type 2 diabetes mellitus with other skin ulcer: Secondary | ICD-10-CM | POA: Diagnosis not present

## 2017-12-23 NOTE — Progress Notes (Signed)
Flo ShanksDAVIS, Clary A. ([id number]) Visit Report for 12/19/2017 Chief Complaint Document Details Patient Name: [name], Nakayla A. Date of Service: 12/19/2017 12:45 PM Medical Record Number: [id number] Patient Account Number: [id number] Date of Birth/Sex: 09-09-1955 (62 y.o. F) Treating RN: Curtis Sitesorthy, Joanna Primary Care Provider: Leotis ShamesSINGH, JASMINE Other Clinician: Referring Provider: Leotis ShamesSINGH, JASMINE Treating Provider/Extender: STONE III, Ereka Brau Weeks in Treatment: 10 Information Obtained from: Patient Chief Complaint Left lower extremity ulcers Electronic Signature(s) Signed: 12/22/2017 8:08:51 AM By: Lenda KelpStone III, Macee Venables PA-C Entered By: Lenda KelpStone III, Blessing Ozga on 12/19/2017 12:51:05 Dunbar, [name] ([id number]) -------------------------------------------------------------------------------- HPI Details Patient Name: [name], [name] Date of Service: 12/19/2017 12:45 PM Medical Record Number: [id number] Patient Account Number: [id number] Date of Birth/Sex: 09-09-1955 (62 y.o. F) Treating RN: Curtis Sitesorthy, Joanna Primary Care Provider: Leotis ShamesSINGH, JASMINE Other Clinician: Referring Provider: Leotis ShamesSINGH, JASMINE Treating Provider/Extender: STONE III, Zyanne Schumm Weeks in Treatment: 10 History of Present Illness HPI Description: 10/09/17-She presents today as an initial evaluation for a right pretibial ulcer. She states approximately 3 weeks ago she fell, lacerating the anterior aspect of her right lower shin. She states she was prescribed Levaquin which she completed last week. She states she has been applying steroid cream to this area with no noted improvement; is eschar covered. She denies pain to this area. 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 She states she has [facility] [place] who has prescribed clobetasol ointment with no noted improvement. She has not been applying an occlusive dressing after the  application of steroid cream. She states there is no known exacerbating or relieving factors. She does complain of pain, pruritus and admits that presentation today is significantly better than they flareup she had last week. 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 We have requested notes from dermatology and will review those. She does state she has had a biopsy to the right foot. Overall, she is a poor historian 10/17/17 on evaluation today the patient whom I have not seen previously but has been in our clinic appears to be doing fairly well at this point in time. She has been tolerating the dressing changes without complication. With that being said I do not believe she's been using the amount of Santyl that is required in order to actually see the necrotic tissue softened at this point. Also think utilizing a Kerlex wrap as opposed to an inclusive Boarder Foam Dressing may be slowing things down because were not retaining enough moisture for the Santyl to be completely effective. With that being said I think both of these issues do need to be addressed at this point. In general however she still continues to have a lot of discomfort. 10/27/17 on evaluation today patient appears to actually be doing very well in regard to her wounds and that they are starting to dramatically loosen up in regard to the [facility]char and [facility] wound. She has been tolerating the dressing changes without complication up to this point. She does have discomfort although it's not as bad she definitely states that when I'm cleaning the wounds it's much worse. 11/05/17-She is seen in follow-up evaluation for right pretibial and left anterior ankle wound. These are stable in appearance; she does not tolerate debridement attempt. We will continue with Santyl and she will follow-up next week 11/14/17 on evaluation today patient's wounds actually seem to be doing better in fact her left lower  Trinity wound actually appears to be almost completely healed. With that being said she's been tolerating the dressing changes without complication. There does not appear to be any evidence of infection at this time. 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 11/25/17 on evaluation today patient's wound on the ankle region has resolved at this point. Appears to be doing very well. With that being said the lower extremity ulcer on the anterior portion of her leg seems to be doing much better to me. She is much less slough than was previously noted which is good news. With that being said she still have some discomfort at the site there still some Slough noted. No fevers, chills, nausea, or vomiting noted at this time. 12/05/17 on evaluation today patient continues to do very well in regard to her right anterior lower extremity ulcer. She's been tolerating the dressing changes without complication. Fortunately there does not appear to be evidence of infection which is good news. Overall I do feel like she's making good signs of progress at this point. 12/19/17 on evaluation today patient actually appears to be doing very well in regard to her lower extremity ulcer on the anterior shin. She's been tolerating the dressing changes without complication. Up to this point we been using Santyl to help clean up the wound. The good news is the wound appears to be extremely clean at this time in fact there's good granulation there's also excellent epithelialization noted over the surface of the wound. In general I'm extremely pleased with how things are in appearance today. NAILA, ELIZONDO ([id number]) Electronic Signature(s) Signed: 12/22/2017 8:08:51 AM By: Lenda Kelp PA-C Entered By: Lenda Kelp on  12/19/2017 13:59:18 Consuegra, [name] ([id number]) -------------------------------------------------------------------------------- Physical Exam Details Patient Name: [name] A. Date of Service: 12/19/2017 12:45 PM Medical Record Number: [id number] Patient Account Number: [id number] Date of Birth/Sex: January 22, 1956 (62 y.o. F) Treating RN: Curtis Sites Primary Care Provider: Leotis Shames Other Clinician: Referring Provider: Leotis Shames Treating Provider/Extender: STONE III, Doneshia Hill Weeks in Treatment: 10 Constitutional Well-nourished and well-hydrated in no acute distress. Respiratory normal breathing without difficulty. clear to auscultation bilaterally. Cardiovascular regular rate and rhythm with normal S1, S2. Psychiatric this patient is able to make decisions and demonstrates good insight into disease process. Alert and Oriented x 3. pleasant and cooperative. Notes Patient's wound bed at this time actually shows again great epithelialization there's no need for sharp debridement today and overall I feel very confident that this is progressing quite nicely. My suggestion is gonna be that we switch the dressing up a little bit but other than that no sharp debridement was required. Electronic Signature(s) Signed: 12/22/2017 8:08:51 AM By: Lenda Kelp PA-C Entered By: Lenda Kelp on 12/19/2017 14:00:06 Keagle, [name] ([id number]) -------------------------------------------------------------------------------- Physician Orders Details Patient Name: [name] A. Date of Service: 12/19/2017 12:45 PM Medical Record Number: [id number] Patient Account Number: [id number] Date of Birth/Sex: July 25, 1955 (62 y.o. 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 o Cleanse wound with mild soap and water o May Shower, gently pat wound dry prior to applying new dressing. Anesthetic (add to Medication List) Wound #1 Right,Anterior Lower Leg o Topical Lidocaine 4% cream applied to wound bed prior to debridement (In Clinic Only). Primary Wound Dressing Wound #1 Right,Anterior Lower Leg o Hydrafera Blue Ready Transfer Secondary Dressing Wound #1 Right,Anterior Lower Leg o [place] Dressing Change Frequency Wound #1 Right,Anterior Lower Leg o Change dressing every day. - once daily Follow-up Appointments Wound #1 Right,Anterior Lower Leg o Return Appointment in 2 weeks. Edema Control Wound #1 Right,Anterior Lower Leg o Elevate legs to the level of the heart and pump ankles as often as possible Additional Orders / Instructions Wound #1 Right,Anterior Lower Leg o Increase protein intake. o Activity as tolerated Frier, Malaka A. ([id number]) Electronic Signature(s) Signed: 12/19/2017 5:18:43 PM By: Curtis Sites Signed: 12/22/2017 8:08:51 AM By: Lenda Kelp PA-C Entered By: Curtis Sites on 12/19/2017 13:45:59 Racz, [name] ([id number]) -------------------------------------------------------------------------------- Problem List Details Patient Name: [name] A. Date of Service: 12/19/2017 12:45 PM Medical Record Number: [id number] Patient Account Number: [id number] Date of Birth/Sex: [birth year] (62 y.o. 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 Date of Service: 12/19/2017 12:45 PM Medical Record Number: [id number] Patient Account Number: [id number] Date of Birth/Sex: 1955/05/11 (62 y.o. F) Treating RN: Curtis Sites Primary Care Provider: Leotis Shames Other Clinician: Referring Provider: Leotis Shames Treating Provider/Extender: Linwood Dibbles, Bralynn Donado Weeks in Treatment: 10 Subjective Chief Complaint Information obtained from Patient Left lower extremity ulcers History of Present Illness (HPI) 10/09/17-She presents today as an initial evaluation for a right pretibial ulcer. She states approximately 3 weeks ago she fell, lacerating the anterior aspect of her right lower shin. She states she was prescribed Levaquin which she completed last week. She states she has been applying steroid cream to this area with no noted improvement; is eschar covered. She denies pain to this area. 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 She states she has seen [facility] dermatology in [place] who has prescribed clobetasol ointment with no noted improvement. She has not been applying an occlusive dressing after the application of steroid cream. She states there is no known exacerbating or relieving factors. She does complain of pain, pruritus and admits that  presentation today is significantly better than they flareup she had last week. 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 We have requested notes from dermatology and will review those. She does state she has had a biopsy to the right foot. Overall, she is a poor historian 10/17/17 on evaluation today the patient whom I have not seen previously but has been in our clinic appears to be doing fairly well at  this point in time. She has been tolerating the dressing changes without complication. With that being said I do not believe she's been using the amount of Santyl that is required in order to actually see the necrotic tissue softened at this point. Also think utilizing a Kerlex wrap as opposed to an inclusive Boarder Foam Dressing may be slowing things down because were not retaining enough moisture for the Santyl to be completely effective. With that being said I think both of these issues do need to be addressed at this point. In general however she still continues to have a lot of discomfort. 10/27/17 on evaluation today patient appears to actually be doing very well in regard to her wounds and that they are starting to dramatically loosen up in regard to the eschar and [facility] covering the surface of the wound. She has been tolerating the dressing changes without complication up to this point. She does have discomfort although it's not as bad she definitely states that when I'm cleaning the wounds it's much worse. 11/05/17-She is seen in follow-up evaluation for right pretibial and left anterior ankle wound. These are stable in appearance; she does not tolerate debridement attempt. We will continue with Santyl and she will follow-up next week 11/14/17 on evaluation today patient's wounds actually seem to be doing better in fact her left lower Trinity wound actually appears to be almost completely healed. With that being said she's been tolerating the dressing changes without complication.  There does not appear to be any evidence of infection at this time. 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 11/25/17 on evaluation today patient's wound on the ankle region has resolved at this point. Appears to be doing very well. With that being said the lower extremity ulcer on the anterior portion of her leg seems to be doing much better to me. She is much less slough than was previously noted which is good news. With that being said she still have some discomfort at the site there still some Slough noted. No fevers, chills, nausea, or vomiting noted at this time. 12/05/17 on evaluation today patient continues to do very well in regard to her right anterior lower extremity ulcer. She's been tolerating the dressing changes without complication. Fortunately there does not appear to be evidence of infection which is good news. Overall I do feel like she's making good signs of progress at this point. ASHTON, SABINE ([id number]) 12/19/17 on evaluation today patient actually appears to be doing very well in regard to her lower extremity ulcer on the anterior shin. She's been tolerating the dressing changes without complication. Up to this point we been using Santyl to help clean up the wound. The good news is the wound appears to be extremely clean at this time in fact there's good granulation there's also excellent epithelialization noted over the surface of the wound. In general I'm extremely pleased with how things are in appearance today. Patient History Information obtained from Patient. Family History Cancer - Mother, Heart Disease - Father, Lung Disease - Mother, No family history of Diabetes, Hereditary Spherocytosis, Hypertension, Kidney Disease, Seizures, Stroke, Thyroid  Problems, Tuberculosis. Social History Former smoker, Marital Status - Widowed, Alcohol Use - Rarely, Drug Use - No History, Caffeine Use - Moderate. 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 Respiratory The patient has no complaints or symptoms. Cardiovascular The patient has no complaints or symptoms. Psychiatric The patient has no complaints or symptoms. Objective Constitutional Well-nourished and well-hydrated in no acute distress. Vitals Time Taken: 12:47 PM, Height: 61 in, Weight: 157 lbs, BMI: 29.7, Temperature: 98.0 F, Pulse: 73 bpm, Respiratory Rate: 16 breaths/min, Blood Pressure: 111/45 mmHg. Respiratory normal breathing without difficulty. clear to auscultation bilaterally. Cardiovascular regular rate and rhythm with normal S1, S2. Psychiatric Mould, Makenley A. ([id number]) this patient is able to make decisions and demonstrates good insight into disease process. Alert and Oriented x 3. pleasant and cooperative. General Notes: Patient's wound bed at this time actually shows again great epithelialization there's no need for sharp debridement today and overall I feel very confident that this is progressing quite nicely. My suggestion is gonna be that we switch the dressing up a little bit but other than that no sharp debridement was required. Integumentary (Hair, Skin) Wound #1 status is Open. Original cause of wound was Trauma. The wound is located on the Right,Anterior Lower Leg. The wound measures 2.5cm length x 0.5cm width x 0.1cm depth; 0.982cm^2 area and 0.098cm^3 volume. There is Fat Layer (Subcutaneous Tissue) Exposed exposed. There is a small amount of serous drainage noted. The wound margin is flat and intact. There is large (67-100%) red, hyper - granulation within the wound  bed. There is no necrotic tissue within the wound bed. The periwound skin appearance exhibited: Scarring, Erythema. The periwound skin appearance did not exhibit: Callus, Crepitus, Excoriation, Induration, Rash, Dry/Scaly, Maceration, Atrophie Blanche, Cyanosis, Ecchymosis, Hemosiderin Staining, Mottled, Pallor, Rubor. The surrounding wound skin color is noted with erythema which is circumferential. Periwound temperature was noted as No Abnormality. The periwound has tenderness on palpation. 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 Cleanse wound with mild soap and water May Shower, gently pat wound dry prior to applying new dressing. Anesthetic (add to Medication List): Wound #1 Right,Anterior Lower Leg: Topical Lidocaine 4% cream applied to wound bed prior to debridement (In Clinic Only). Primary Wound Dressing: Wound #1 Right,Anterior Lower Leg: Hydrafera Blue Ready Transfer Secondary Dressing: Wound #1 Right,Anterior Lower Leg: [place] Dressing Change Frequency: Wound #1 Right,Anterior Lower Leg: Change dressing every day. - once daily Follow-up Appointments: Wound #1 Right,Anterior Lower Leg: Return Appointment in 2 weeks. Edema Control: WINONA, SISON A. ([id number]) Wound #1 Right,Anterior Lower Leg: Elevate legs to the level of the heart and pump ankles as often as possible Additional Orders / Instructions: Wound #1 Right,Anterior Lower Leg: Increase protein intake. Activity as tolerated At this time my suggestion is gonna be that we actually continue with the above wound care measures for the next week. She is in agreement the plan. We will subsequently see were things stand at follow-up. Please see above for specific wound care  orders. We will see patient for re-evaluation in 1 week(s) here in the clinic. If anything worsens or changes patient will contact our office for additional recommendations. Electronic Signature(s) Signed: 12/22/2017 8:08:51 AM By: Lenda Kelp PA-C Entered By: Lenda Kelp on 12/19/2017 14:01:43 [place]worth, [name] ([id number]) -------------------------------------------------------------------------------- ROS/PFSH Details Patient Name: [name]  A. Date of Service: 12/19/2017 12:45 PM Medical Record Number: [id number] Patient Account Number: [id number] Date of Birth/Sex: Sep 26, 1955 (62 y.o. F) Treating RN: Curtis Sites Primary Care Provider: Leotis Shames Other Clinician: Referring Provider: Leotis Shames Treating Provider/Extender: STONE III, Silver Parkey Weeks in Treatment: 10 Information Obtained From Patient Wound History Do you currently have one or more open woundso Yes Has your wound(s) ever healed and then re-openedo No Have you had any lab work done in the past montho No Have you tested positive for an antibiotic resistant organism (MRSA, VRE)o No Have you tested positive for osteomyelitis (bone infection)o No Have you had any tests for circulation on your legso No Constitutional Symptoms (General Health) Complaints and Symptoms: Negative for: Fever; Chills Eyes Medical History: Negative for: Cataracts; Glaucoma; Optic Neuritis Ear/Nose/Mouth/Throat Medical History: Negative for: Chronic sinus problems/congestion; Middle ear problems Hematologic/Lymphatic Medical History: Negative for: Anemia; Hemophilia; Human Immunodeficiency Virus; Lymphedema; Sickle Cell Disease Respiratory Complaints and Symptoms: No Complaints or Symptoms Medical History: Positive for: Chronic Obstructive Pulmonary Disease (COPD) - chronic bronchitis Negative for: Aspiration; Asthma; Pneumothorax; Sleep Apnea; Tuberculosis Cardiovascular Complaints and Symptoms: No Complaints or  Symptoms Medical History: Positive for: Hypertension Negative for: Angina; Arrhythmia; Congestive Heart Failure; Coronary Artery Disease; Deep Vein Thrombosis; Hypotension; Myocardial Infarction; Peripheral Arterial Disease; Peripheral Venous Disease; Phlebitis; Vasculitis Past Medical History NotesSHANASIA, IBRAHIM ([id number]) hypercholesteremia Gastrointestinal Medical History: Negative for: Cirrhosis ; Colitis; Crohnos; Hepatitis A; Hepatitis B; Hepatitis C Endocrine Medical History: Positive for: Type II Diabetes Negative for: Type I Diabetes Genitourinary Medical History: Negative for: End Stage Renal Disease Immunological Medical History: Negative for: Lupus Erythematosus; Raynaudos; Scleroderma Integumentary (Skin) Medical History: Negative for: History of Burn; History of pressure wounds Musculoskeletal Medical History: Negative for: Gout; Rheumatoid Arthritis; Osteoarthritis; Osteomyelitis Neurologic Medical History: Positive for: Neuropathy Negative for: Dementia; Quadriplegia; Paraplegia; Seizure Disorder Past Medical History Notes: CVA in history but patient denies Oncologic Medical History: Negative for: Received Chemotherapy; Received Radiation Psychiatric Complaints and Symptoms: No Complaints or Symptoms Medical History: Negative for: Anorexia/bulimia; Confinement Anxiety Immunizations Pneumococcal Vaccine: Received Pneumococcal Vaccination: Yes RANEEN, JAFFER A. ([id number]) Implantable Devices Family and Social History Cancer: Yes - Mother; Diabetes: No; Heart Disease: Yes - Father; Hereditary Spherocytosis: No; Hypertension: No; Kidney Disease: No; Lung Disease: Yes - Mother; Seizures: No; Stroke: No; Thyroid Problems: No; Tuberculosis: No; Former smoker; Marital Status - Widowed; Alcohol Use: Rarely; Drug Use: No History; Caffeine Use: Moderate; Financial Concerns: No; Food, Clothing or Shelter Needs: No; Support System Lacking: No;  Transportation Concerns: No; Advanced Directives: No; Patient does not want information on Advanced Directives Physician Affirmation I have reviewed and agree with the above information. Electronic Signature(s) Signed: 12/19/2017 5:18:43 PM By: Curtis Sites Signed: 12/22/2017 8:08:51 AM By: Lenda Kelp PA-C Entered By: Lenda Kelp on 12/19/2017 13:59:36 Bucio, [name] ([id number]) -------------------------------------------------------------------------------- SuperBill Details Patient Name: [name] A. Date of Service: 12/19/2017 Medical Record Number: [id number] Patient Account Number: [id number] Date of Birth/Sex: [birth year] (62 y.o. 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

## 2017-12-23 NOTE — Progress Notes (Signed)
[name], [name] ([id number]) Visit Report for 12/19/2017 Arrival Information Details Patient Name: Kayla, BAUMGARNER A. Date of Service: 12/19/2017 12:45 PM Medical Record Number: [id number] Patient Account Number: [id number] Date of Birth/Sex: 1955-11-29 (62 y.o. F) Treating RN: Curtis Sites Primary Care [name]: Leotis Shames Other Clinician: Referring Lydiann Bonifas: Leotis Shames Treating Betzabe Bevans/Extender: STONE III, HOYT Weeks in Treatment: 10 Visit Information History Since Last Visit Added or deleted any medications: No Patient Arrived: Ambulatory Any new allergies or adverse reactions: No Arrival Time: 12:47 Had a fall or experienced change in No Accompanied By: self activities of daily living that may affect Transfer Assistance: None risk of falls: Patient Identification Verified: Yes Signs or symptoms of abuse/neglect since last visito No Secondary Verification Process Completed: Yes Hospitalized since last visit: No Patient Has Alerts: Yes Implantable device outside of the clinic excluding No Patient Alerts: DMII cellular tissue based products placed in the center since last visit: Has Dressing in Place as Prescribed: Yes Pain Present Now: Yes Electronic Signature(s) Signed: 12/19/2017 4:20:22 PM By: Dayton Martes RCP, RRT, CHT Entered By: Dayton Martes on 12/19/2017 12:48:02 Jamil, [name] ([id number]) -------------------------------------------------------------------------------- Clinic Level of Care Assessment Details Patient Name: [name] A. Date of Service: 12/19/2017 12:45 PM Medical Record Number: [id number] Patient Account Number: [id number] Date of Birth/Sex: December 18, 1955 (62 y.o. F) Treating RN: Curtis Sites Primary Care Caitlan Chauca: Leotis Shames Other Clinician: Referring Pam Vanalstine: Leotis Shames Treating Burr Soffer/Extender: STONE III, HOYT Weeks in Treatment: 10 Clinic Level of Care Assessment Items TOOL 4 Quantity  Score []  - Use when only an EandM is performed on FOLLOW-UP visit 0 ASSESSMENTS - Nursing Assessment / Reassessment X - Reassessment of Co-morbidities (includes updates in patient status) 1 10 X- 1 5 Reassessment of Adherence to Treatment Plan ASSESSMENTS - Wound and Skin Assessment / Reassessment X - Simple Wound Assessment / Reassessment - one wound 1 5 []  - 0 Complex Wound Assessment / Reassessment - multiple wounds []  - 0 Dermatologic / Skin Assessment (not related to wound area) ASSESSMENTS - Focused Assessment []  - Circumferential Edema Measurements - multi extremities 0 []  - 0 Nutritional Assessment / Counseling / Intervention X- 1 5 Lower Extremity Assessment (monofilament, tuning fork, pulses) []  - 0 Peripheral Arterial Disease Assessment (using hand held doppler) ASSESSMENTS - Ostomy and/or Continence Assessment and Care []  - Incontinence Assessment and Management 0 []  - 0 Ostomy Care Assessment and Management (repouching, etc.) PROCESS - Coordination of Care X - Simple Patient / Family Education for ongoing care 1 15 []  - 0 Complex (extensive) Patient / Family Education for ongoing care X- 1 10 Staff obtains Chiropractor, Records, Test Results / Process Orders []  - 0 Staff telephones HHA, Nursing Homes / Clarify orders / etc []  - 0 Routine Transfer to another Facility (non-emergent condition) []  - 0 Routine Hospital Admission (non-emergent condition) []  - 0 New Admissions / Manufacturing engineer / Ordering NPWT, Apligraf, etc. []  - 0 Emergency Hospital Admission (emergent condition) X- 1 10 Simple Discharge Coordination [name], [name] ([id number]) []  - 0 Complex (extensive) Discharge Coordination PROCESS - Special Needs []  - Pediatric / Minor Patient Management 0 []  - 0 Isolation Patient Management []  - 0 Hearing / Language / Visual special needs []  - 0 Assessment of Community assistance (transportation, D/C planning, etc.) []  - 0 Additional  assistance / Altered mentation []  - 0 Support Surface(s) Assessment (bed, cushion, seat, etc.) INTERVENTIONS - Wound Cleansing / Measurement X - Simple Wound Cleansing - one wound 1 5 []  -  0 Complex Wound Cleansing - multiple wounds X- 1 5 Wound Imaging (photographs - any number of wounds) []  - 0 Wound Tracing (instead of photographs) X- 1 5 Simple Wound Measurement - one wound []  - 0 Complex Wound Measurement - multiple wounds INTERVENTIONS - Wound Dressings X - Small Wound Dressing one or multiple wounds 1 10 []  - 0 Medium Wound Dressing one or multiple wounds []  - 0 Large Wound Dressing one or multiple wounds []  - 0 Application of Medications - topical []  - 0 Application of Medications - injection INTERVENTIONS - Miscellaneous []  - External ear exam 0 []  - 0 Specimen Collection (cultures, biopsies, blood, body fluids, etc.) []  - 0 Specimen(s) / Culture(s) sent or taken to Lab for analysis []  - 0 Patient Transfer (multiple staff / Nurse, adult / Similar devices) []  - 0 Simple Staple / Suture removal (25 or less) []  - 0 Complex Staple / Suture removal (26 or more) []  - 0 Hypo / Hyperglycemic Management (close monitor of Blood Glucose) []  - 0 Ankle / Brachial Index (ABI) - do not check if billed separately X- 1 5 Vital Signs [name], [name] ([id number]) Has the patient been seen at the hospital within the last three years: Yes Total Score: 90 Level Of Care: New/Established - Level 3 Electronic Signature(s) Signed: 12/19/2017 5:18:43 PM By: Curtis Sites Entered By: Curtis Sites on 12/19/2017 13:53:12 [name], [name] ([id number]) -------------------------------------------------------------------------------- Encounter Discharge Information Details Patient Name: [name] A. Date of Service: 12/19/2017 12:45 PM Medical Record Number: [id number] Patient Account Number: [id number] Date of Birth/Sex: 1955-07-06 (62 y.o. F) Treating RN: Curtis Sites Primary  Care Mirra Basilio: Leotis Shames Other Clinician: Referring Xolani Degracia: Leotis Shames Treating Lorae Roig/Extender: Linwood Dibbles, HOYT Weeks in Treatment: 10 Encounter Discharge Information Items Discharge Condition: Stable Ambulatory Status: Ambulatory Discharge Destination: Home Transportation: Private Auto Accompanied By: self Schedule Follow-up Appointment: Yes Clinical Summary of Care: Electronic Signature(s) Signed: 12/19/2017 5:18:43 PM By: Curtis Sites Entered By: Curtis Sites on 12/19/2017 13:53:59 Bonillas, [name] ([id number]) -------------------------------------------------------------------------------- Lower Extremity Assessment Details Patient Name: [name] A. Date of Service: 12/19/2017 12:45 PM Medical Record Number: [id number] Patient Account Number: [id number] Date of Birth/Sex: [birth year] (62 y.o. F) Treating RN: Rema Jasmine Primary Care Porshia Blizzard: Leotis Shames Other Clinician: Referring Deshonda Cryderman: Leotis Shames Treating Jona Erkkila/Extender: STONE III, HOYT Weeks in Treatment: 10 Edema Assessment Assessed: [Left: No] [Right: No] [Left: Edema] [Right: :] Calf Left: Right: Point of Measurement: 30 cm From Medial Instep cm 31 cm Ankle Left: Right: Point of Measurement: 12 cm From Medial Instep cm 18 cm Vascular Assessment Claudication: Claudication Assessment [Right:None] Pulses: Dorsalis Pedis Palpable: [Right:Yes] Posterior Tibial Extremity colors, hair growth, and conditions: Extremity Color: [Right:Normal] Hair Growth on Extremity: [Right:No] Temperature of Extremity: [Right:Warm] Capillary Refill: [Right:< 3 seconds] Toe Nail Assessment Left: Right: Thick: Yes Discolored: No Deformed: No Improper Length and Hygiene: No Electronic Signature(s) Signed: 12/19/2017 4:01:10 PM By: Rema Jasmine Entered By: Rema Jasmine on 12/19/2017 12:59:54 [name], [name]  ([id number]) -------------------------------------------------------------------------------- Multi Wound Chart Details Patient Name: [name] A. Date of Service: 12/19/2017 12:45 PM Medical Record Number: [id number] Patient Account Number: [id number] Date of Birth/Sex: [birth year] (62 y.o. F) Treating RN: Curtis Sites Primary Care Macklyn Glandon: Leotis Shames Other Clinician: Referring Lajuana Patchell: Leotis Shames Treating Ikeem Cleckler/Extender: STONE III, HOYT Weeks in Treatment: 10 Vital Signs Height(in): 61 Pulse(bpm): 73 Weight(lbs): 157 Blood Pressure(mmHg): 111/45 Body Mass Index(BMI): 30 Temperature(F): 98.0 Respiratory Rate 16 (breaths/min): Photos: [N/A:N/A] Wound Location: Right Lower Leg - Anterior N/A N/A  Wounding Event: Trauma N/A N/A Primary Etiology: Diabetic Wound/Ulcer of the N/A N/A Lower Extremity Comorbid History: Chronic Obstructive N/A N/A Pulmonary Disease (COPD), Hypertension, Type II Diabetes, Neuropathy Date Acquired: 09/25/2017 N/A N/A Weeks of Treatment: 10 N/A N/A Wound Status: Open N/A N/A Measurements L x W x D 2.5x0.5x0.1 N/A N/A (cm) Area (cm) : 0.982 N/A N/A Volume (cm) : 0.098 N/A N/A % Reduction in Area: 64.30% N/A N/A % Reduction in Volume: 64.40% N/A N/A Classification: Grade 1 N/A N/A Exudate Amount: Small N/A N/A Exudate Type: Serous N/A N/A Exudate Color: amber N/A N/A Wound Margin: Flat and Intact N/A N/A Granulation Amount: Large (67-100%) N/A N/A Granulation Quality: Red, Hyper-granulation N/A N/A Necrotic Amount: None Present (0%) N/A N/A Exposed Structures: Fat Layer (Subcutaneous N/A N/A Tissue) Exposed: Yes Fascia: No Tendon: No [name], [name] ([id number]) Muscle: No Joint: No Bone: No Epithelialization: Small (1-33%) N/A N/A Periwound Skin Texture: Scarring: Yes N/A N/A Excoriation: No Induration: No Callus: No Crepitus: No Rash: No Periwound Skin Moisture: Maceration: No N/A N/A Dry/Scaly: No Periwound  Skin Color: Erythema: Yes N/A N/A Atrophie Blanche: No Cyanosis: No Ecchymosis: No Hemosiderin Staining: No Mottled: No Pallor: No Rubor: No Erythema Location: Circumferential N/A N/A Temperature: No Abnormality N/A N/A Tenderness on Palpation: Yes N/A N/A Wound Preparation: Ulcer Cleansing: N/A N/A Rinsed/Irrigated with Saline Topical Anesthetic Applied: Other: lidocaine 4% Treatment Notes Electronic Signature(s) Signed: 12/19/2017 5:18:43 PM By: Curtis Sites Entered By: Curtis Sites on 12/19/2017 13:43:41 [name], [name] ([id number]) -------------------------------------------------------------------------------- Multi-Disciplinary Care Plan Details Patient Name: [name] A. Date of Service: 12/19/2017 12:45 PM Medical Record Number: [id number] Patient Account Number: [id number] Date of Birth/Sex: 08-Dec-1955 (63 y.o. F) Treating RN: Curtis Sites Primary Care Gaynor Genco: Leotis Shames Other Clinician: Referring Maleka Contino: Leotis Shames Treating Denesia Donelan/Extender: STONE III, HOYT Weeks in Treatment: 10 Active Inactive ` Abuse / Safety / Falls / Self Care Management Nursing Diagnoses: Potential for falls Goals: Patient will not experience any injury related to falls Date Initiated: 10/09/2017 Target Resolution Date: 02/07/2018 Goal Status: Active Interventions: Assess Activities of Daily Living upon admission and as needed Assess fall risk on admission and as needed Assess: immobility, friction, shearing, incontinence upon admission and as needed Notes: ` Nutrition Nursing Diagnoses: Imbalanced nutrition Impaired glucose control: actual or potential Potential for alteratiion in Nutrition/Potential for imbalanced nutrition Goals: Patient/caregiver agrees to and verbalizes understanding of need to use nutritional supplements and/or vitamins as prescribed Date Initiated: 10/09/2017 Target Resolution Date: 01/03/2018 Goal Status: Active Patient/caregiver will  maintain therapeutic glucose control Date Initiated: 10/09/2017 Target Resolution Date: 02/07/2018 Goal Status: Active Interventions: Assess patient nutrition upon admission and as needed per policy Provide education on elevated blood sugars and impact on wound healing Provide education on nutrition Notes: ` Orientation to the Wound Care Program LEXIANNA, WEINRICH ([id number]) Nursing Diagnoses: Knowledge deficit related to the wound healing center program Goals: Patient/caregiver will verbalize understanding of the Wound Healing Center Program Date Initiated: 10/09/2017 Target Resolution Date: 11/08/2017 Goal Status: Active Interventions: Provide education on orientation to the wound center Notes: ` Pain, Acute or Chronic Nursing Diagnoses: Pain, acute or chronic: actual or potential Potential alteration in comfort, pain Goals: Patient/caregiver will verbalize adequate pain control between visits Date Initiated: 10/09/2017 Target Resolution Date: 01/03/2018 Goal Status: Active Interventions: Complete pain assessment as per visit requirements Encourage patient to take pain medications as prescribed Notes: ` Wound/Skin Impairment Nursing Diagnoses: Impaired tissue integrity Knowledge deficit related to ulceration/compromised skin integrity Goals: Ulcer/skin breakdown will have  a volume reduction of 80% by week 12 Date Initiated: 10/09/2017 Target Resolution Date: 01/03/2018 Goal Status: Active Interventions: Assess patient/caregiver ability to perform ulcer/skin care regimen upon admission and as needed Assess ulceration(s) every visit Notes: Electronic Signature(s) Signed: 12/19/2017 5:18:43 PM By: Curtis Sites Entered By: Curtis Sites on 12/19/2017 13:43:34 [name], [name] ([id number]) [name], [name] ([id number]) -------------------------------------------------------------------------------- Pain Assessment Details Patient Name: [name] A. Date of  Service: 12/19/2017 12:45 PM Medical Record Number: [id number] Patient Account Number: [id number] Date of Birth/Sex: 06-09-1955 (62 y.o. F) Treating RN: Curtis Sites Primary Care Tremont Gavitt: Leotis Shames Other Clinician: Referring Crystalee Ventress: Leotis Shames Treating Che Rachal/Extender: STONE III, HOYT Weeks in Treatment: 10 Active Problems Location of Pain Severity and Description of Pain Patient Has Paino Yes Site Locations Rate the pain. Current Pain Level: 8 Pain Management and Medication Current Pain Management: Electronic Signature(s) Signed: 12/19/2017 4:20:22 PM By: Dayton Martes RCP, RRT, CHT Signed: 12/19/2017 5:18:43 PM By: Curtis Sites Entered By: Dayton Martes on 12/19/2017 12:48:13 Brester, [name] ([id number]) -------------------------------------------------------------------------------- Patient/Caregiver Education Details Patient Name: [name] A. Date of Service: 12/19/2017 12:45 PM Medical Record Number: [id number] Patient Account Number: [id number] Date of Birth/Gender: 1955/12/25 (62 y.o. F) Treating RN: Curtis Sites Primary Care Physician: Leotis Shames Other Clinician: Referring Physician: Leotis Shames Treating Physician/Extender: Skeet Simmer in Treatment: 10 Education Assessment Education Provided To: Patient Education Topics Provided Wound/Skin Impairment: Handouts: Other: wound care as ordered Methods: Demonstration, Explain/Verbal Responses: State content correctly Electronic Signature(s) Signed: 12/19/2017 5:18:43 PM By: Curtis Sites Entered By: Curtis Sites on 12/19/2017 13:54:13 Bottcher, [name] ([id number]) -------------------------------------------------------------------------------- Wound Assessment Details Patient Name: [name] A. Date of Service: 12/19/2017 12:45 PM Medical Record Number: [id number] Patient Account Number: [id number] Date of Birth/Sex: 17-Mar-1956 (62 y.o.  F) Treating RN: Rema Jasmine Primary Care Lakin Romer: Leotis Shames Other Clinician: Referring Shameika Speelman: Leotis Shames Treating Vince Ainsley/Extender: STONE III, HOYT Weeks in Treatment: 10 Wound Status Wound Number: 1 Primary Diabetic Wound/Ulcer of the Lower Extremity Etiology: Wound Location: Right Lower Leg - Anterior Wound Open Wounding Event: Trauma Status: Date Acquired: 09/25/2017 Comorbid Chronic Obstructive Pulmonary Disease Weeks Of Treatment: 10 History: (COPD), Hypertension, Type II Diabetes, Clustered Wound: No Neuropathy Photos Wound Measurements Length: (cm) 2.5 Width: (cm) 0.5 Depth: (cm) 0.1 Area: (cm) 0.982 Volume: (cm) 0.098 % Reduction in Area: 64.3% % Reduction in Volume: 64.4% Epithelialization: Small (1-33%) Wound Description Classification: Grade 1 Foul Odor Wound Margin: Flat and Intact Slough/Fib Exudate Amount: Small Exudate Type: Serous Exudate Color: amber After Cleansing: No rino No Wound Bed Granulation Amount: Large (67-100%) Exposed Structure Granulation Quality: Red, Hyper-granulation Fascia Exposed: No Necrotic Amount: None Present (0%) Fat Layer (Subcutaneous Tissue) Exposed: Yes Tendon Exposed: No Muscle Exposed: No Joint Exposed: No Bone Exposed: No Periwound Skin Texture Texture Color [name], [name] ([id number]) No Abnormalities Noted: No No Abnormalities Noted: No Callus: No Atrophie Blanche: No Crepitus: No Cyanosis: No Excoriation: No Ecchymosis: No Induration: No Erythema: Yes Rash: No Erythema Location: Circumferential Scarring: Yes Hemosiderin Staining: No Mottled: No Moisture Pallor: No No Abnormalities Noted: No Rubor: No Dry / Scaly: No Maceration: No Temperature / Pain Temperature: No Abnormality Tenderness on Palpation: Yes Wound Preparation Ulcer Cleansing: Rinsed/Irrigated with Saline Topical Anesthetic Applied: Other: lidocaine 4%, Treatment Notes Wound #1 (Right, Anterior Lower Leg) 1.  Cleansed with: Clean wound with Normal Saline 2. Anesthetic Topical Lidocaine 4% cream to wound bed prior to debridement 4. Dressing Applied: Hydrafera Blue 5. Secondary Dressing Applied [facility] [facility] Electronic  Signature(s) Signed: 12/19/2017 4:01:10 PM By: Rema JasmineNg, Wendi Entered By: Rema JasmineNg, Wendi on 12/19/2017 13:02:58 Hughett, Kayla CastillaEBORAH A. ([id number]) -------------------------------------------------------------------------------- Vitals Details Patient Name: Kayla KeelAVIS, Kathee A. Date of Service: 12/19/2017 12:45 PM Medical Record Number: [id number] Patient Account Number: [id number] Date of Birth/Sex: [birth year] (62 y.o. F) Treating RN: Curtis Sitesorthy, Joanna Primary Care Ruchy Wildrick: Leotis ShamesSINGH, JASMINE Other Clinician: Referring Nellie Chevalier: Leotis ShamesSINGH, JASMINE Treating Boneta Standre/Extender: STONE III, HOYT Weeks in Treatment: 10 Vital Signs Time Taken: 12:47 Temperature (F): 98.0 Height (in): 61 Pulse (bpm): 73 Weight (lbs): 157 Respiratory Rate (breaths/min): 16 Body Mass Index (BMI): 29.7 Blood Pressure (mmHg): 111/45 Reference Range: 80 - 120 mg / dl Electronic Signature(s) Signed: 12/19/2017 4:20:22 PM By: Dayton MartesWallace, RCP,RRT,CHT, Sallie RCP, RRT, CHT Entered By: Dayton MartesWallace, RCP,RRT,CHT, Sallie on 12/19/2017 12:50:35

## 2017-12-26 ENCOUNTER — Encounter: Payer: Medicare Other | Admitting: Physician Assistant

## 2017-12-26 DIAGNOSIS — E11622 Type 2 diabetes mellitus with other skin ulcer: Secondary | ICD-10-CM | POA: Diagnosis not present

## 2017-12-28 NOTE — Progress Notes (Signed)
ELMA, SHANDS ([id number]) Visit Report for 12/26/2017 Chief Complaint Document Details Patient Name: [name], [name] Date of Service: 12/26/2017 11:15 AM Medical Record Number: [id number] Patient Account Number: [id number] Date of Birth/Sex: Jul 27, 1955 (62 y.o. F) Treating RN: Curtis Sites Primary Care Provider: Leotis Shames Other Clinician: Referring Provider: Leotis Shames Treating Provider/Extender: STONE III,  Weeks in Treatment: 11 Information Obtained from: Patient Chief Complaint Left lower extremity ulcers Electronic Signature(s) Signed: 12/26/2017 5:13:23 PM By: Lenda Kelp PA-C Entered By: Lenda Kelp on 12/26/2017 17:07:07 Perfect, [name] ([id number]) -------------------------------------------------------------------------------- HPI Details Patient Name: Kayla Keel A. Date of Service: 12/26/2017 11:15 AM Medical Record Number: [id number] Patient Account Number: [id number] Date of Birth/Sex: 08-08-1955 (62 y.o. F) Treating RN: Curtis Sites Primary Care Provider: Leotis Shames Other Clinician: Referring Provider: Leotis Shames Treating Provider/Extender: STONE III,  Weeks in Treatment: 11 History of Present Illness HPI Description: 10/09/17-She presents today as an initial evaluation for a right pretibial ulcer. She states approximately 3 weeks ago she fell, lacerating the anterior aspect of her right lower shin. She states she was prescribed Levaquin which she completed last week. She states she has been applying steroid cream to this area with no noted improvement; is eschar covered. She denies pain to this area. 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 She states she has seen [facility] dermatology in [place] who has prescribed clobetasol ointment with no noted improvement. She has not been applying an occlusive dressing after the  application of steroid cream. She states there is no known exacerbating or relieving factors. She does complain of pain, pruritus and admits that presentation today is significantly better than they flareup she had last week. 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 We have requested notes from dermatology and will review those. She does state she has had a biopsy to the right foot. Overall, she is a poor historian 10/17/17 on evaluation today the patient whom I have not seen previously but has been in our clinic appears to be doing fairly well at this point in time. She has been tolerating the dressing changes without complication. With that being said I do not believe she's been using the amount of Santyl that is required in order to actually see the necrotic tissue softened at this point. Also think utilizing a Kerlex wrap as opposed to an inclusive Boarder Foam Dressing may be slowing things down because were not retaining enough moisture for the Santyl to be completely effective. With that being said I think both of these issues do need to be addressed at this point. In general however she still continues to have a lot of discomfort. 10/27/17 on evaluation today patient appears to actually be doing very well in regard to her wounds and that they are starting to dramatically loosen up in regard to the eschar and [facility] covering the surface of the wound. She has been tolerating the dressing changes without complication up to this point. She does have discomfort although it's not as bad she definitely states that when I'm cleaning the wounds it's much worse. 11/05/17-She is seen in follow-up evaluation for right pretibial and left anterior ankle wound. These are stable in appearance; she does not tolerate debridement attempt. We will continue with Santyl and she will follow-up next week 11/14/17 on evaluation today patient's wounds actually seem to be doing better in fact her left lower  Trinity wound actually appears to be almost completely healed. With that being said she's been tolerating the dressing changes without complication. There does not appear to be any evidence of infection at this time. 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 11/25/17 on evaluation today patient's wound on the ankle region has resolved at this point. Appears to be doing very well. With that being said the lower extremity ulcer on the anterior portion of her leg seems to be doing much better to me. She is much less slough than was previously noted which is good news. With that being said she still have some discomfort at the site there still some Slough noted. No fevers, chills, nausea, or vomiting noted at this time. 12/05/17 on evaluation today patient continues to do very well in regard to her right anterior lower extremity ulcer. She's been tolerating the dressing changes without complication. Fortunately there does not appear to be evidence of infection which is good news. Overall I do feel like she's making good signs of progress at this point. 12/19/17 on evaluation today patient actually appears to be doing very well in regard to her lower extremity ulcer on the anterior shin. She's been tolerating the dressing changes without complication. Up to this point we been using Santyl to help clean up the wound. The good news is the wound appears to be extremely clean at this time in fact there's good granulation there's also excellent epithelialization noted over the surface of the wound. In general I'm extremely pleased with how things are in appearance today. [name], [name] ([id number]) 12/26/17 on evaluation today patient actually appears to be doing excellent in regard to her lower extremity ulcer.  In fact this appears to be completely healed which is great news she's having no pain. Electronic Signature(s) Signed: 12/26/2017 5:13:23 PM By: Lenda Kelp PA-C Entered By: Lenda Kelp on 12/26/2017 17:07:17 Maciejewski, [name] ([id number]) -------------------------------------------------------------------------------- Physical Exam Details Patient Name: Kayla Keel A. Date of Service: 12/26/2017 11:15 AM Medical Record Number: [id number] Patient Account Number: [id number] Date of Birth/Sex: Jan 28, 1956 (62 y.o. F) Treating RN: Curtis Sites Primary Care Provider: Leotis Shames Other Clinician: Referring Provider: Leotis Shames Treating Provider/Extender: STONE III,  Weeks in Treatment: 11 Constitutional Well-nourished and well-hydrated in no acute distress. Respiratory normal breathing without difficulty. Psychiatric this patient is able to make decisions and demonstrates good insight into disease process. Alert and Oriented x 3. pleasant and cooperative. Electronic Signature(s) Signed: 12/26/2017 5:13:23 PM By: Lenda Kelp PA-C Entered By: Lenda Kelp on 12/26/2017 17:07:41 [name], [name] ([id number]) -------------------------------------------------------------------------------- Physician Orders Details Patient Name: Kayla Keel A. Date of Service: 12/26/2017 11:15 AM Medical Record Number: [id number] Patient Account Number: [id number] Date of Birth/Sex: 1955/11/30 (62 y.o. F) Treating RN: Curtis Sites Primary Care Provider: Leotis Shames Other Clinician: Referring Provider: Leotis Shames Treating Provider/Extender: Linwood Dibbles,  Weeks in Treatment: 11 Verbal / Phone Orders: No Diagnosis Coding Discharge From [facility] Services o Discharge from Wound Care Center Electronic Signature(s) Signed: 12/26/2017 11:40:42 AM By: Curtis Sites Signed: 12/26/2017 5:13:23 PM By: Lenda Kelp PA-C Entered By: Curtis Sites on 12/26/2017 11:40:41 [name],  [name] ([id number]) -------------------------------------------------------------------------------- Problem List Details Patient Name: Kayla Keel A. Date of Service: 12/26/2017 11:15 AM Medical Record  Number: [id number] Patient Account Number: [id number] Date of Birth/Sex: [birth year] (62 y.o. 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 ([id number]) -------------------------------------------------------------------------------- Progress Note Details Patient Name: Kayla Keel A. Date of Service: 12/26/2017 11:15 AM Medical Record Number: [id number] Patient Account Number: [id number] Date of Birth/Sex: December 10, 1955 (62 y.o. F) Treating RN: Curtis Sites Primary Care Provider: Leotis Shames Other Clinician: Referring Provider: Leotis Shames Treating Provider/Extender: Linwood Dibbles,  Weeks in Treatment: 11 Subjective Chief Complaint Information obtained from Patient Left lower extremity ulcers History of Present Illness (HPI) 10/09/17-She presents today as an initial evaluation for a right pretibial ulcer. She states approximately 3 weeks ago she fell, lacerating the anterior aspect of her right lower shin. She states she was prescribed Levaquin which she completed last week. She states she has been applying steroid cream to this area with no  noted improvement; is eschar covered. She denies pain to this area. 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 She states she has seen [facility] dermatology in [place] who has prescribed clobetasol ointment with no noted improvement. She has not been applying an occlusive dressing after the application of steroid cream. She states there is no known exacerbating or relieving factors. She does complain of pain, pruritus and admits that presentation today is significantly better than they flareup she had last week. 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 We have requested notes from dermatology and will review those. She does state she has had a biopsy to the right foot. Overall, she is a poor historian 10/17/17 on evaluation today the patient whom I have not seen previously but has been in our clinic appears to be doing fairly well at this point in time. She has been tolerating the dressing changes without complication. With that being said I do not believe she's been using the amount of Santyl that is required in order to actually see the necrotic tissue softened at this point. Also think utilizing a Kerlex wrap as opposed to an inclusive Boarder Foam Dressing may be slowing things down because were not retaining enough moisture for the Santyl to be completely effective. With that being said I think both of these issues do need to be addressed at this point. In general however she still continues to have a lot of discomfort. 10/27/17 on evaluation today patient appears to actually be doing very well in regard to her wounds and that they are starting to dramatically loosen up in regard to the eschar and [facility] covering the surface of the wound. She has been tolerating the dressing changes without complication up to this point. She does have discomfort although it's not as bad she  definitely states that when I'm cleaning the wounds it's much worse. 11/05/17-She is seen in follow-up evaluation for right pretibial and left anterior ankle wound. These are stable in appearance; she does not tolerate debridement attempt. We will continue  with Santyl and she will follow-up next week 11/14/17 on evaluation today patient's wounds actually seem to be doing better in fact her left lower Trinity wound actually appears to be almost completely healed. With that being said she's been tolerating the dressing changes without complication. There does not appear to be any evidence of infection at this time. 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 11/25/17 on evaluation today patient's wound on the ankle region has resolved at this point. Appears to be doing very well. With that being said the lower extremity ulcer on the anterior portion of her leg seems to be doing much better to me. She is much less slough than was previously noted which is good news. With that being said she still have some discomfort at the site there still some Slough noted. No fevers, chills, nausea, or vomiting noted at this time. 12/05/17 on evaluation today patient continues to do very well in regard to her right anterior lower extremity ulcer. She's been tolerating the dressing changes without complication. Fortunately there does not appear to be evidence of infection which is good news. Overall I do feel like she's making good signs of progress at this point. [name], [name] ([id number]) 12/19/17 on evaluation today patient actually appears to be doing very well in regard to her lower extremity ulcer on the anterior shin. She's been tolerating the dressing changes without complication. Up to this point we been using  Santyl to help clean up the wound. The good news is the wound appears to be extremely clean at this time in fact there's good granulation there's also excellent epithelialization noted over the surface of the wound. In general I'm extremely pleased with how things are in appearance today. 12/26/17 on evaluation today patient actually appears to be doing excellent in regard to her lower extremity ulcer. In fact this appears to be completely healed which is great news she's having no pain. Patient History Information obtained from Patient. Family History Cancer - Mother, Heart Disease - Father, Lung Disease - Mother, No family history of Diabetes, Hereditary Spherocytosis, Hypertension, Kidney Disease, Seizures, Stroke, Thyroid Problems, Tuberculosis. Social History Former smoker, Marital Status - Widowed, Alcohol Use - Rarely, Drug Use - No History, Caffeine Use - Moderate. 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 Psychiatric The patient has no complaints or symptoms. Objective Constitutional Well-nourished and well-hydrated in no acute distress. Vitals Time Taken: 11:15 AM, Height: 61 in, Weight: 157 lbs, BMI: 29.7, Temperature: 98.1 F, Pulse: 67 bpm, Respiratory Rate: 16 breaths/min, Blood Pressure: 134/55 mmHg. Respiratory normal breathing without difficulty. Psychiatric this patient is able to make decisions and demonstrates good insight into disease process. Alert and Oriented x 3. pleasant and cooperative. [name], [name] AMarland Kitchen ([id number]) Integumentary (Hair, Skin) Wound #1 status is Open. Original cause of wound was Trauma. The wound is located on the Right,Anterior Lower Leg. The wound measures 0cm length x 0cm width x 0cm depth; 0cm^2 area and 0cm^3 volume. 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 She's in agreement that plan. This will give the wound time to toughen up as well so she does not end up with any reopening's. She's in agreement with the plan. Otherwise will see her back for reevaluation as needed in the future if anything changes or worsens. Electronic Signature(s) Signed: 12/26/2017 5:13:23 PM By: Lenda Kelp PA-C Entered By: Lenda Kelp on 12/26/2017 17:07:50 [name], [name] ([id number]) -------------------------------------------------------------------------------- ROS/PFSH Details Patient Name: Kayla Keel A. Date of Service: 12/26/2017 11:15 AM Medical Record Number: [id number] Patient Account Number: [id number] Date of Birth/Sex: Aug 26, 1955 (62 y.o. F) Treating RN: Curtis Sites Primary Care Provider: Leotis Shames Other Clinician: Referring Provider: Leotis Shames Treating Provider/Extender: STONE III,  Weeks in Treatment: 11 Information Obtained From Patient Wound History Do you currently have one or more open woundso Yes Has your wound(s) ever healed and then re-openedo No Have you had any lab work done in the past montho No Have you tested positive for an antibiotic resistant organism (MRSA, VRE)o No Have you tested positive for osteomyelitis (bone infection)o No Have you had any tests for circulation on your legso No Constitutional Symptoms (General Health) Complaints and Symptoms: Negative for: Fever; Chills Eyes Medical History: Negative for: Cataracts; Glaucoma; Optic Neuritis Ear/Nose/Mouth/Throat Medical  History: Negative for: Chronic sinus problems/congestion; Middle ear problems Hematologic/Lymphatic Medical History: Negative for: Anemia; Hemophilia; Human Immunodeficiency Virus; Lymphedema; Sickle Cell Disease Respiratory Medical History: Positive for: Chronic Obstructive Pulmonary Disease (COPD) - chronic bronchitis Negative for: Aspiration; Asthma; Pneumothorax; Sleep Apnea; Tuberculosis Cardiovascular Medical History: Positive for: Hypertension Negative for: Angina; Arrhythmia; Congestive Heart Failure; Coronary Artery Disease; Deep Vein Thrombosis; Hypotension; Myocardial Infarction; Peripheral Arterial Disease; Peripheral Venous Disease; Phlebitis; Vasculitis Past Medical History Notes: hypercholesteremia Gastrointestinal Medical History: Negative for: Cirrhosis ; Colitis; Crohnos; Hepatitis A; Hepatitis B; Hepatitis C Comes, [name] ([id number]) Endocrine Medical History: Positive for: Type II Diabetes Negative for: Type I Diabetes Genitourinary Medical History: Negative for: End Stage Renal Disease Immunological Medical History: Negative for: Lupus Erythematosus; Raynaudos; Scleroderma Integumentary (Skin) Medical History: Negative for: History of Burn; History of pressure wounds Musculoskeletal Medical History: Negative for: Gout; Rheumatoid Arthritis; Osteoarthritis; Osteomyelitis Neurologic Medical History: Positive for: Neuropathy Negative for: Dementia; Quadriplegia; Paraplegia; Seizure Disorder Past Medical History Notes: CVA in history but patient denies Oncologic Medical History: Negative for: Received Chemotherapy; Received Radiation Psychiatric Complaints and Symptoms: No Complaints or Symptoms Medical History: Negative for: Anorexia/bulimia; Confinement Anxiety Immunizations Pneumococcal Vaccine: Received Pneumococcal Vaccination: Yes Implantable Devices Family and Social History Cancer: Yes - Mother; Diabetes: No; Heart Disease: Yes -  Father; Hereditary Spherocytosis: No; Hypertension: No; Kidney Disease: No; Lung Disease: Yes - Mother; Seizures: No; Stroke: No; Thyroid Problems: No; Tuberculosis: No; Former smoker; Marital Status - Widowed; Alcohol Use: Rarely; Drug Use: No History; Caffeine Use: Moderate; Financial Concerns: [name], [name] ([id number]) No; Food, Clothing or Shelter Needs: No; Support System Lacking: No; Transportation Concerns: No; Advanced Directives: No; Patient does not want information on Advanced Directives Physician Affirmation I have reviewed and agree with the above information. Electronic Signature(s) Signed: 12/26/2017 5:07:50 PM By: Curtis Sites Signed: 12/26/2017 5:13:23 PM By: Lenda Kelp PA-C Entered By: Lenda Kelp on 12/26/2017 17:07:29 Shurley, [name] ([id number]) -------------------------------------------------------------------------------- SuperBill Details Patient Name: Kayla Keel A.  Date of Service: 12/26/2017 Medical Record Number: [id number] Patient Account Number: [id number] Date of Birth/Sex: Jul 19, 1955 (62 y.o. 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 L40.9 Psoriasis, unspecified Facility Procedures CPT4 Code: [id number] Description: 25956 - WOUND CARE VISIT-LEV 2 EST PT Modifier: Quantity: 1 Physician Procedures CPT4 Code: [id number] Description: 32951 - WC PHYS LEVEL 2 - EST PT ICD-10 Diagnosis Description L97.212 Non-pressure chronic ulcer of right calf with fat layer ex L97.322 Non-pressure chronic ulcer of left ankle with fat layer ex L40.9 Psoriasis, unspecified Modifier: posed posed Quantity: 1 Electronic  Signature(s) Signed: 12/26/2017 5:13:23 PM By: Lenda Kelp PA-C Previous Signature: 12/26/2017 11:41:23 AM Version By: Curtis Sites Entered By: Lenda Kelp on 12/26/2017 17:08:06

## 2017-12-28 NOTE — Progress Notes (Signed)
RASHAUNA, TEP ([id number]) Visit Report for 12/26/2017 Arrival Information Details Patient Name: [name], [name]. Date of Service: 12/26/2017 11:15 AM Medical Record Number: [id number] Patient Account Number: [id number] Date of Birth/Sex: 01/08/1956 (62 y.o. F) Treating RN: Curtis Sites Primary Care Daleen Steinhaus: Leotis Shames Other Clinician: Referring Cammi Consalvo: Leotis Shames Treating Markiah Janeway/Extender: STONE III, HOYT Weeks in Treatment: 11 Visit Information History Since Last Visit Added or deleted any medications: No Patient Arrived: Ambulatory Any new allergies or adverse reactions: No Arrival Time: 11:14 Had [name] fall or experienced change in No Accompanied By: self activities of daily living that may affect Transfer Assistance: None risk of falls: Patient Identification Verified: Yes Signs or symptoms of abuse/neglect since last visito No Secondary Verification Process Completed: Yes Hospitalized since last visit: No Patient Has Alerts: Yes Implantable device outside of the clinic excluding No Patient Alerts: DMII cellular tissue based products placed in the center since last visit: Has Dressing in Place as Prescribed: Yes Pain Present Now: No Electronic Signature(s) Signed: 12/26/2017 11:52:00 AM By: Dayton Martes RCP, RRT, CHT Entered By: Dayton Martes on 12/26/2017 11:15:04 Payano, [name] ([id number]) -------------------------------------------------------------------------------- Clinic Level of Care Assessment Details Patient Name: Kayla Keel [name]. Date of Service: 12/26/2017 11:15 AM Medical Record Number: [id number] Patient Account Number: [id number] Date of Birth/Sex: 06-18-1955 (62 y.o. F) Treating RN: Curtis Sites Primary Care Hatim Homann: Leotis Shames Other Clinician: Referring Jalyric Kaestner: Leotis Shames Treating Adonna Horsley/Extender: STONE III, HOYT Weeks in Treatment: 11 Clinic Level of Care Assessment Items TOOL 4 Quantity  Score []  - Use when only an EandM is performed on FOLLOW-UP visit 0 ASSESSMENTS - Nursing Assessment / Reassessment X - Reassessment of Co-morbidities (includes updates in patient status) 1 10 X- 1 5 Reassessment of Adherence to Treatment Plan ASSESSMENTS - Wound and Skin Assessment / Reassessment X - Simple Wound Assessment / Reassessment - one wound 1 5 []  - 0 Complex Wound Assessment / Reassessment - multiple wounds []  - 0 Dermatologic / Skin Assessment (not related to wound area) ASSESSMENTS - Focused Assessment []  - Circumferential Edema Measurements - multi extremities 0 []  - 0 Nutritional Assessment / Counseling / Intervention X- 1 5 Lower Extremity Assessment (monofilament, tuning fork, pulses) []  - 0 Peripheral Arterial Disease Assessment (using hand held doppler) ASSESSMENTS - Ostomy and/or Continence Assessment and Care []  - Incontinence Assessment and Management 0 []  - 0 Ostomy Care Assessment and Management (repouching, etc.) PROCESS - Coordination of Care X - Simple Patient / Family Education for ongoing care 1 15 []  - 0 Complex (extensive) Patient / Family Education for ongoing care []  - 0 Staff obtains Chiropractor, Records, Test Results / Process Orders []  - 0 Staff telephones HHA, Nursing Homes / Clarify orders / etc []  - 0 Routine Transfer to another Facility (non-emergent condition) []  - 0 Routine Hospital Admission (non-emergent condition) []  - 0 New Admissions / Manufacturing engineer / Ordering NPWT, Apligraf, etc. []  - 0 Emergency Hospital Admission (emergent condition) X- 1 10 Simple Discharge Coordination Dales, Hallee [name]. ([id number]) []  - 0 Complex (extensive) Discharge Coordination PROCESS - Special Needs []  - Pediatric / Minor Patient Management 0 []  - 0 Isolation Patient Management []  - 0 Hearing / Language / Visual special needs []  - 0 Assessment of Community assistance (transportation, D/C planning, etc.) []  - 0 Additional assistance  / Altered mentation []  - 0 Support Surface(s) Assessment (bed, cushion, seat, etc.) INTERVENTIONS - Wound Cleansing / Measurement X - Simple Wound Cleansing - one wound 1 5 []  -  0 Complex Wound Cleansing - multiple wounds X- 1 5 Wound Imaging (photographs - any number of wounds) []  - 0 Wound Tracing (instead of photographs) X- 1 5 Simple Wound Measurement - one wound []  - 0 Complex Wound Measurement - multiple wounds INTERVENTIONS - Wound Dressings []  - Small Wound Dressing one or multiple wounds 0 []  - 0 Medium Wound Dressing one or multiple wounds []  - 0 Large Wound Dressing one or multiple wounds []  - 0 Application of Medications - topical []  - 0 Application of Medications - injection INTERVENTIONS - Miscellaneous []  - External ear exam 0 []  - 0 Specimen Collection (cultures, biopsies, blood, body fluids, etc.) []  - 0 Specimen(s) / Culture(s) sent or taken to Lab for analysis []  - 0 Patient Transfer (multiple staff / Nurse, adult / Similar devices) []  - 0 Simple Staple / Suture removal (25 or less) []  - 0 Complex Staple / Suture removal (26 or more) []  - 0 Hypo / Hyperglycemic Management (close monitor of Blood Glucose) []  - 0 Ankle / Brachial Index (ABI) - do not check if billed separately X- 1 5 Vital Signs Tine, Devika [name]. ([id number]) Has the patient been seen at the hospital within the last three years: Yes Total Score: 70 Level Of Care: ____ Electronic Signature(s) Signed: 12/26/2017 5:07:50 PM By: Curtis Sites Entered By: Curtis Sites on 12/26/2017 11:41:14 Wanner, [name] ([id number]) -------------------------------------------------------------------------------- Encounter Discharge Information Details Patient Name: Kayla Keel [name]. Date of Service: 12/26/2017 11:15 AM Medical Record Number: [id number] Patient Account Number: [id number] Date of Birth/Sex: 1955/05/09 (62 y.o. F) Treating RN: Curtis Sites Primary Care Hawley Pavia: Leotis Shames  Other Clinician: Referring Pacey Altizer: Leotis Shames Treating Rokia Bosket/Extender: Linwood Dibbles, HOYT Weeks in Treatment: 11 Encounter Discharge Information Items Discharge Condition: Stable Ambulatory Status: Ambulatory Discharge Destination: Home Transportation: Private Auto Accompanied By: self Schedule Follow-up Appointment: No Clinical Summary of Care: Electronic Signature(s) Signed: 12/26/2017 11:42:04 AM By: Curtis Sites Entered By: Curtis Sites on 12/26/2017 11:42:04 Hooser, [name] ([id number]) -------------------------------------------------------------------------------- Lower Extremity Assessment Details Patient Name: Kayla Keel [name]. Date of Service: 12/26/2017 11:15 AM Medical Record Number: [id number] Patient Account Number: [id number] Date of Birth/Sex: November 28, 1955 (62 y.o. F) Treating RN: Huel Coventry Primary Care Mayco Walrond: Leotis Shames Other Clinician: Referring Ren Grasse: Leotis Shames Treating Linlee Cromie/Extender: STONE III, HOYT Weeks in Treatment: 11 Vascular Assessment Pulses: Dorsalis Pedis Palpable: [Right:Yes] Posterior Tibial Extremity colors, hair growth, and conditions: Extremity Color: [Right:Normal] Hair Growth on Extremity: [Right:Yes] Temperature of Extremity: [Right:Warm] Capillary Refill: [Right:< 3 seconds] Toe Nail Assessment Left: Right: Thick: No Discolored: No Deformed: No Improper Length and Hygiene: No Electronic Signature(s) Signed: 12/26/2017 5:33:00 PM By: Elliot Gurney, BSN, RN, CWS, Kim RN, BSN Entered By: Elliot Gurney, BSN, RN, CWS, Kim on 12/26/2017 11:29:29 Lecompte, [name] ([id number]) -------------------------------------------------------------------------------- Multi-Disciplinary Care Plan Details Patient Name: Kayla Keel [name]. Date of Service: 12/26/2017 11:15 AM Medical Record Number: [id number] Patient Account Number: [id number] Date of Birth/Sex: Jan 23, 1956 (62 y.o. F) Treating RN: Huel Coventry Primary Care My Rinke: Leotis Shames Other Clinician: Referring Ezrah Panning: Leotis Shames Treating Zebulon Gantt/Extender: Linwood Dibbles, HOYT Weeks in Treatment: 11 Active Inactive Electronic Signature(s) Signed: 12/26/2017 11:40:08 AM By: Curtis Sites Signed: 12/26/2017 5:33:00 PM By: Elliot Gurney, BSN, RN, CWS, Kim RN, BSN Entered By: Curtis Sites on 12/26/2017 11:40:08 Garmany, [name] ([id number]) -------------------------------------------------------------------------------- Pain Assessment Details Patient Name: Kayla Keel [name]. Date of Service: 12/26/2017 11:15 AM Medical Record Number: [id number] Patient Account Number: [id number] Date of Birth/Sex: Feb 16, 1956 (62 y.o. F) Treating RN: Curtis Sites Primary Care Yvonne Stopher:  [facility], JASMINE Other Clinician: Referring Yohana Bartha: [facility], JASMINE Treating Crystian Frith/Extender: STONE III, HOYT Weeks in Treatment: 11 Active Problems Location of Pain Severity and Description of Pain Patient Has Paino No Site Locations Pain Management and Medication Current Pain Management: Electronic Signature(s) Signed: 12/26/2017 11:52:00 AM By: Dayton Martes RCP, RRT, CHT Signed: 12/26/2017 5:07:50 PM By: Curtis Sites Entered By: Dayton Martes on 12/26/2017 11:15:12 Moldovan, [name] ([id number]) -------------------------------------------------------------------------------- Patient/Caregiver Education Details Patient Name: Kayla Keel [name]. Date of Service: 12/26/2017 11:15 AM Medical Record Number: [id number] Patient Account Number: [id number] Date of Birth/Gender: 04-Mar-1956 (62 y.o. F) Treating RN: Curtis Sites Primary Care Physician: Leotis Shames Other Clinician: Referring Physician: Leotis Shames Treating Physician/Extender: Skeet Simmer in Treatment: 11 Education Assessment Education Provided To: Patient Education Topics Provided Basic Hygiene: Handouts: Other: care of newly healed ulcer site Methods: Explain/Verbal Responses: State  content correctly Electronic Signature(s) Signed: 12/26/2017 5:07:50 PM By: Curtis Sites Entered By: Curtis Sites on 12/26/2017 11:41:47 Giron, [name] ([id number]) -------------------------------------------------------------------------------- Wound Assessment Details Patient Name: Kayla Keel [name]. Date of Service: 12/26/2017 11:15 AM Medical Record Number: [id number] Patient Account Number: [id number] Date of Birth/Sex: 01/13/1956 (62 y.o. F) Treating RN: Huel Coventry Primary Care Kastin Cerda: Leotis Shames Other Clinician: Referring Khush Pasion: Leotis Shames Treating Faustino Luecke/Extender: STONE III, HOYT Weeks in Treatment: 11 Wound Status Wound Number: 1 Primary Diabetic Wound/Ulcer of the Lower Etiology: Extremity Wound Location: Right, Anterior Lower Leg Wound Status: Open Wounding Event: Trauma Date Acquired: 09/25/2017 Weeks Of Treatment: 11 Clustered Wound: No Photos Photo Uploaded By: Elliot Gurney, BSN, RN, CWS, Kim on 12/26/2017 14:04:26 Wound Measurements Length: (cm) 0 % Redu Width: (cm) 0 % Redu Depth: (cm) 0 Area: (cm) 0 Volume: (cm) 0 ction in Area: 100% ction in Volume: 100% Wound Description Classification: Grade 1 Periwound Skin Texture Texture Color No Abnormalities Noted: No No Abnormalities Noted: No Moisture No Abnormalities Noted: No Electronic Signature(s) Signed: 12/26/2017 5:33:00 PM By: Elliot Gurney, BSN, RN, CWS, Kim RN, BSN Entered By: Elliot Gurney, BSN, RN, CWS, Kim on 12/26/2017 11:28:31 Diego, [name] ([id number]) -------------------------------------------------------------------------------- Vitals Details Patient Name: Kayla Keel [name]. Date of Service: 12/26/2017 11:15 AM Medical Record Number: [id number] Patient Account Number: [id number] Date of Birth/Sex: May 12, 1955 (62 y.o. F) Treating RN: Curtis Sites Primary Care Courteney Alderete: Leotis Shames Other Clinician: Referring Mason Burleigh: Leotis Shames Treating Rosilyn Coachman/Extender: STONE III, HOYT Weeks  in Treatment: 11 Vital Signs Time Taken: 11:15 Temperature (F): 98.1 Height (in): 61 Pulse (bpm): 67 Weight (lbs): 157 Respiratory Rate (breaths/min): 16 Body Mass Index (BMI): 29.7 Blood Pressure (mmHg): 134/55 Reference Range: 80 - 120 mg / dl Electronic Signature(s) Signed: 12/26/2017 11:52:00 AM By: Dayton Martes RCP, RRT, CHT Entered By: Dayton Martes on 12/26/2017 11:17:45

## 2018-03-18 ENCOUNTER — Other Ambulatory Visit: Payer: Self-pay | Admitting: Internal Medicine

## 2018-03-18 DIAGNOSIS — R1312 Dysphagia, oropharyngeal phase: Secondary | ICD-10-CM

## 2018-04-15 ENCOUNTER — Ambulatory Visit: Payer: Medicare Other

## 2019-01-14 ENCOUNTER — Other Ambulatory Visit: Payer: Self-pay | Admitting: Internal Medicine

## 2019-01-14 DIAGNOSIS — R635 Abnormal weight gain: Secondary | ICD-10-CM

## 2019-01-14 DIAGNOSIS — R1013 Epigastric pain: Secondary | ICD-10-CM

## 2019-01-20 ENCOUNTER — Other Ambulatory Visit: Payer: Self-pay

## 2019-01-20 ENCOUNTER — Ambulatory Visit
Admission: RE | Admit: 2019-01-20 | Discharge: 2019-01-20 | Disposition: A | Discharge status: Home / Self Care | Payer: Medicare Other | Source: Ambulatory Visit | Attending: Internal Medicine | Admitting: Internal Medicine

## 2019-01-20 DIAGNOSIS — R635 Abnormal weight gain: Secondary | ICD-10-CM | POA: Diagnosis present

## 2019-01-20 DIAGNOSIS — R1013 Epigastric pain: Secondary | ICD-10-CM

## 2019-09-08 ENCOUNTER — Other Ambulatory Visit: Payer: Self-pay | Admitting: Internal Medicine

## 2019-09-08 DIAGNOSIS — Z1231 Encounter for screening mammogram for malignant neoplasm of breast: Secondary | ICD-10-CM

## 2019-09-09 ENCOUNTER — Ambulatory Visit
Admission: RE | Admit: 2019-09-09 | Discharge: 2019-09-09 | Disposition: A | Discharge status: Home / Self Care | Payer: Medicare Other | Source: Ambulatory Visit | Attending: Internal Medicine | Admitting: Internal Medicine

## 2019-09-09 DIAGNOSIS — Z1231 Encounter for screening mammogram for malignant neoplasm of breast: Secondary | ICD-10-CM | POA: Diagnosis present

## 2019-09-20 ENCOUNTER — Inpatient Hospital Stay
Admission: RE | Admit: 2019-09-20 | Discharge: 2019-09-20 | Disposition: A | Discharge status: Home / Self Care | Payer: Self-pay | Source: Ambulatory Visit | Attending: *Deleted | Admitting: *Deleted

## 2019-09-20 ENCOUNTER — Other Ambulatory Visit: Payer: Self-pay | Admitting: *Deleted

## 2019-09-20 DIAGNOSIS — Z1231 Encounter for screening mammogram for malignant neoplasm of breast: Secondary | ICD-10-CM

## 2019-10-28 ENCOUNTER — Ambulatory Visit
Admission: RE | Admit: 2019-10-28 | Discharge: 2019-10-28 | Disposition: A | Discharge status: Home / Self Care | Payer: Medicare Other | Attending: Physician Assistant | Admitting: Physician Assistant

## 2019-10-28 ENCOUNTER — Ambulatory Visit
Admission: RE | Admit: 2019-10-28 | Discharge: 2019-10-28 | Disposition: A | Discharge status: Home / Self Care | Payer: Medicare Other | Source: Ambulatory Visit | Attending: Physician Assistant | Admitting: Physician Assistant

## 2019-10-28 ENCOUNTER — Other Ambulatory Visit: Payer: Self-pay | Admitting: Physician Assistant

## 2019-10-28 DIAGNOSIS — M542 Cervicalgia: Secondary | ICD-10-CM | POA: Insufficient documentation

## 2019-10-28 DIAGNOSIS — M545 Low back pain, unspecified: Secondary | ICD-10-CM

## 2020-06-23 ENCOUNTER — Other Ambulatory Visit: Payer: Self-pay | Admitting: Surgery

## 2020-06-29 ENCOUNTER — Other Ambulatory Visit: Payer: Self-pay

## 2020-06-29 ENCOUNTER — Encounter
Admission: RE | Admit: 2020-06-29 | Discharge: 2020-06-29 | Disposition: A | Discharge status: Home / Self Care | Payer: 59 | Source: Ambulatory Visit | Attending: Surgery | Admitting: Surgery

## 2020-06-29 HISTORY — DX: Other intervertebral disc degeneration, lumbar region without mention of lumbar back pain or lower extremity pain: M51.369

## 2020-06-29 HISTORY — DX: Other intervertebral disc degeneration, lumbar region: M51.36

## 2020-06-29 HISTORY — DX: Atherosclerosis of aorta: I70.0

## 2020-06-29 NOTE — Patient Instructions (Addendum)
Your procedure is scheduled on:07-06-20 THURSDAY Report to the Registration Desk on the 1st floor of the [facility]-Then proceed to the 2nd floor Surgery Desk in the Medical Mall To find out your arrival time, please call [phone] between 1PM - 3PM on:07-05-20 WEDNESDAY  REMEMBER: Instructions that are not followed completely may result in serious medical risk, up to and including death; or upon the discretion of your surgeon and anesthesiologist your surgery may need to be rescheduled.  Do not eat food after midnight the night before surgery.  No gum chewing, lozengers or hard candies.  You may however, drink WATER up to 2 hours before you are scheduled to arrive for your surgery. Do not drink anything within 2 hours of your scheduled arrival time.  Type 1 and Type 2 diabetics should only drink water.  TAKE THESE MEDICATIONS THE MORNING OF SURGERY WITH A SIP OF WATER: -CRESTOR (ROSUVASTATIN) -VIIBRYD (VILAZODONE) -GABAPENTIN (NEURONTIN) -NEXIUM (ESOMEPRAZOLE)-take one the night before and one on the morning of surgery - helps to prevent nausea after surgery.) -YOU MAY TAKE YOUR OXYCODONE FOR PAIN THE MORNING OF SURGERY IF NEEDED/YOU MAY ALSO TAKE YOUR XANAX FOR ANXIETY IF NEEDED AS WELL  USE YOUR ALBUTEROL INHALER THE DAY OF SURGERY AND BRING ALBUTEROL INHALER TO THE HOSPITAL  Follow recommendations from Cardiologist, Pulmonologist or PCP regarding stopping Aspirin, Coumadin, Plavix, Eliquis, Pradaxa, or Pletal-STOP YOUR 81 MG ASPRIN NOW (06-29-20)  One week prior to surgery: Stop Anti-inflammatories (NSAIDS) such as Advil, Aleve, Ibuprofen, Motrin, Naproxen, Naprosyn and Aspirin based products such as Excedrin, Goodys Powder, BC Powder-OK TO TAKE TYLENOL/OXYCODONE IF NEEDED  Stop ANY OVER THE COUNTER supplements until after surgery.  No Alcohol for 24 hours before or after surgery.  No Smoking including e-cigarettes for 24 hours prior to surgery.  No chewable tobacco products  for at least 6 hours prior to surgery.  No nicotine patches on the day of surgery.  Do not use any "recreational" drugs for at least a week prior to your surgery.  Please be advised that the combination of cocaine and anesthesia may have negative outcomes, up to and including death. If you test positive for cocaine, your surgery will be cancelled.  On the morning of surgery brush your teeth with toothpaste and water, you may rinse your mouth with mouthwash if you wish. Do not swallow any toothpaste or mouthwash.  Do not wear jewelry, make-up, hairpins, clips or nail polish.  Do not wear lotions, powders, or perfumes.   Do not shave body from the neck down 48 hours prior to surgery just in case you cut yourself which could leave a site for infection.  Also, freshly shaved skin may become irritated if using the CHG soap.  Contact lenses, hearing aids and dentures may not be worn into surgery.  Do not bring valuables to the hospital. [facility] is not responsible for any missing/lost belongings or valuables.   Use CHG Soap as directed on instruction sheet.  Notify your doctor if there is any change in your medical condition (cold, fever, infection).  Wear comfortable clothing (specific to your surgery type) to the hospital.  Plan for stool softeners for home use; pain medications have a tendency to cause constipation. You can also help prevent constipation by eating foods high in fiber such as fruits and vegetables and drinking plenty of fluids as your diet allows.  After surgery, you can help prevent lung complications by doing breathing exercises.  Take deep breaths and cough every  1-2 hours. Your doctor may order a device called an Incentive Spirometer to help you take deep breaths. When coughing or sneezing, hold a pillow firmly against your incision with both hands. This is called "splinting." Doing this helps protect your incision. It also decreases belly discomfort.  If you are  being admitted to the hospital overnight, leave your suitcase in the car. After surgery it may be brought to your room.  If you are being discharged the day of surgery, you will not be allowed to drive home. You will need a responsible adult (18 years or older) to drive you home and stay with you that night.   If you are taking public transportation, you will need to have a responsible adult (18 years or older) with you. Please confirm with your physician that it is acceptable to use public transportation.   Please call the Pre-admissions Testing Dept. at [phone] if you have any questions about these instructions.  Surgery Visitation Policy:  Patients undergoing a surgery or procedure may have one family member or support person with them as long as that person is not COVID-19 positive or experiencing its symptoms.  That person may remain in the waiting area during the procedure.  Inpatient Visitation:    Visiting hours are 7 a.m. to 8 p.m. Inpatients will be allowed two visitors daily. The visitors may change each day during the patient's stay. No visitors under the age of 13. Any visitor under the age of 82 must be accompanied by an adult. The visitor must pass COVID-19 screenings, use hand sanitizer when entering and exiting the patient's room and wear a mask at all times, including in the patient's room. Patients must also wear a mask when staff or their visitor are in the room. Masking is required regardless of vaccination status.

## 2020-07-03 ENCOUNTER — Encounter
Admission: RE | Admit: 2020-07-03 | Discharge: 2020-07-03 | Disposition: A | Discharge status: Home / Self Care | Payer: 59 | Source: Ambulatory Visit | Attending: Surgery | Admitting: Surgery

## 2020-07-03 ENCOUNTER — Other Ambulatory Visit: Payer: Self-pay

## 2020-07-03 DIAGNOSIS — E118 Type 2 diabetes mellitus with unspecified complications: Secondary | ICD-10-CM | POA: Diagnosis not present

## 2020-07-03 DIAGNOSIS — J449 Chronic obstructive pulmonary disease, unspecified: Secondary | ICD-10-CM | POA: Insufficient documentation

## 2020-07-03 DIAGNOSIS — Z01818 Encounter for other preprocedural examination: Secondary | ICD-10-CM | POA: Diagnosis not present

## 2020-07-03 DIAGNOSIS — Z20822 Contact with and (suspected) exposure to covid-19: Secondary | ICD-10-CM | POA: Diagnosis not present

## 2020-07-03 LAB — SARS CORONAVIRUS 2 (TAT 6-24 HRS): SARS Coronavirus 2: NEGATIVE

## 2020-07-04 ENCOUNTER — Other Ambulatory Visit: Payer: 59

## 2020-07-06 ENCOUNTER — Ambulatory Visit: Payer: 59

## 2020-07-06 ENCOUNTER — Encounter: Payer: Self-pay | Admitting: Surgery

## 2020-07-06 ENCOUNTER — Other Ambulatory Visit: Payer: Self-pay

## 2020-07-06 ENCOUNTER — Encounter
Admission: RE | Disposition: A | Discharge status: Home / Self Care | Payer: Self-pay | Source: Home / Self Care | Attending: Surgery

## 2020-07-06 ENCOUNTER — Ambulatory Visit: Payer: 59 | Admitting: Anesthesiology

## 2020-07-06 ENCOUNTER — Ambulatory Visit
Admission: RE | Admit: 2020-07-06 | Discharge: 2020-07-06 | Disposition: A | Discharge status: Home / Self Care | Payer: 59 | Attending: Surgery | Admitting: Surgery

## 2020-07-06 DIAGNOSIS — E119 Type 2 diabetes mellitus without complications: Secondary | ICD-10-CM | POA: Diagnosis not present

## 2020-07-06 DIAGNOSIS — Z8616 Personal history of COVID-19: Secondary | ICD-10-CM | POA: Diagnosis not present

## 2020-07-06 DIAGNOSIS — Z8249 Family history of ischemic heart disease and other diseases of the circulatory system: Secondary | ICD-10-CM | POA: Diagnosis not present

## 2020-07-06 DIAGNOSIS — J449 Chronic obstructive pulmonary disease, unspecified: Secondary | ICD-10-CM | POA: Diagnosis not present

## 2020-07-06 DIAGNOSIS — Z825 Family history of asthma and other chronic lower respiratory diseases: Secondary | ICD-10-CM | POA: Diagnosis not present

## 2020-07-06 DIAGNOSIS — Z888 Allergy status to other drugs, medicaments and biological substances status: Secondary | ICD-10-CM | POA: Insufficient documentation

## 2020-07-06 DIAGNOSIS — Z803 Family history of malignant neoplasm of breast: Secondary | ICD-10-CM | POA: Insufficient documentation

## 2020-07-06 DIAGNOSIS — Z79899 Other long term (current) drug therapy: Secondary | ICD-10-CM | POA: Diagnosis not present

## 2020-07-06 DIAGNOSIS — M7501 Adhesive capsulitis of right shoulder: Secondary | ICD-10-CM | POA: Insufficient documentation

## 2020-07-06 DIAGNOSIS — M25511 Pain in right shoulder: Secondary | ICD-10-CM

## 2020-07-06 DIAGNOSIS — Z791 Long term (current) use of non-steroidal anti-inflammatories (NSAID): Secondary | ICD-10-CM | POA: Diagnosis not present

## 2020-07-06 DIAGNOSIS — Z7984 Long term (current) use of oral hypoglycemic drugs: Secondary | ICD-10-CM | POA: Insufficient documentation

## 2020-07-06 DIAGNOSIS — Z7982 Long term (current) use of aspirin: Secondary | ICD-10-CM | POA: Insufficient documentation

## 2020-07-06 DIAGNOSIS — Z7951 Long term (current) use of inhaled steroids: Secondary | ICD-10-CM | POA: Insufficient documentation

## 2020-07-06 DIAGNOSIS — Z801 Family history of malignant neoplasm of trachea, bronchus and lung: Secondary | ICD-10-CM | POA: Diagnosis not present

## 2020-07-06 HISTORY — PX: SHOULDER ARTHROSCOPY: SHX128

## 2020-07-06 LAB — GLUCOSE, CAPILLARY
Glucose-Capillary: 160 mg/dL — ABNORMAL HIGH (ref 70–99)
Glucose-Capillary: 172 mg/dL — ABNORMAL HIGH (ref 70–99)

## 2020-07-06 SURGERY — ARTHROSCOPY, SHOULDER
Anesthesia: General | Site: Shoulder | Laterality: Right

## 2020-07-06 MED ORDER — ONDANSETRON HCL 4 MG/2ML IJ SOLN: 4.0000 mg | Freq: Once | INTRAMUSCULAR | Status: DC | PRN

## 2020-07-06 MED ORDER — FENTANYL CITRATE (PF) 100 MCG/2ML IJ SOLN
INTRAMUSCULAR | Status: AC
  Administered 2020-07-06: 50 ug via INTRAVENOUS
  Filled 2020-07-06: qty 2

## 2020-07-06 MED ORDER — PROPOFOL 10 MG/ML IV BOLUS
INTRAVENOUS | Status: DC | PRN
  Administered 2020-07-06: 60 mg via INTRAVENOUS
  Administered 2020-07-06: 30 mg via INTRAVENOUS

## 2020-07-06 MED ORDER — TRIAMCINOLONE ACETONIDE 40 MG/ML IJ SUSP
INTRAMUSCULAR | Status: AC
  Filled 2020-07-06: qty 1

## 2020-07-06 MED ORDER — BUPIVACAINE HCL (PF) 0.5 % IJ SOLN
INTRAMUSCULAR | Status: AC
  Filled 2020-07-06: qty 10

## 2020-07-06 MED ORDER — SODIUM CHLORIDE 0.9 % IV SOLN: INTRAVENOUS | Status: DC

## 2020-07-06 MED ORDER — FENTANYL CITRATE (PF) 100 MCG/2ML IJ SOLN: 50.0000 ug | Freq: Once | INTRAMUSCULAR | Status: AC

## 2020-07-06 MED ORDER — CEFAZOLIN SODIUM-DEXTROSE 2-4 GM/100ML-% IV SOLN: 2.0000 g | INTRAVENOUS | Status: DC

## 2020-07-06 MED ORDER — LIDOCAINE HCL (PF) 1 % IJ SOLN
INTRAMUSCULAR | Status: AC
  Filled 2020-07-06: qty 5

## 2020-07-06 MED ORDER — CHLORHEXIDINE GLUCONATE 0.12 % MT SOLN: 15.0000 mL | Freq: Once | OROMUCOSAL | Status: AC

## 2020-07-06 MED ORDER — BUPIVACAINE LIPOSOME 1.3 % IJ SUSP
INTRAMUSCULAR | Status: AC
  Filled 2020-07-06: qty 20

## 2020-07-06 MED ORDER — ORAL CARE MOUTH RINSE: 15.0000 mL | Freq: Once | OROMUCOSAL | Status: AC

## 2020-07-06 MED ORDER — BUPIVACAINE-EPINEPHRINE (PF) 0.5% -1:200000 IJ SOLN
INTRAMUSCULAR | Status: AC
  Filled 2020-07-06: qty 30

## 2020-07-06 MED ORDER — TRIAMCINOLONE ACETONIDE 40 MG/ML IJ SUSP
INTRAMUSCULAR | Status: DC | PRN
  Administered 2020-07-06: 10 mL

## 2020-07-06 MED ORDER — ROPIVACAINE HCL 5 MG/ML IJ SOLN
INTRAMUSCULAR | Status: AC
  Filled 2020-07-06: qty 30

## 2020-07-06 MED ORDER — FENTANYL CITRATE (PF) 100 MCG/2ML IJ SOLN: 25.0000 ug | INTRAMUSCULAR | Status: DC | PRN

## 2020-07-06 MED ORDER — OXYCODONE HCL 5 MG PO TABS
10.0000 mg | ORAL_TABLET | Freq: Once | ORAL | Status: DC | PRN
Start: 2020-07-06 — End: 2020-07-06

## 2020-07-06 MED ORDER — MIDAZOLAM HCL 2 MG/2ML IJ SOLN
INTRAMUSCULAR | Status: AC
  Filled 2020-07-06: qty 2

## 2020-07-06 MED ORDER — PROPOFOL 10 MG/ML IV BOLUS
INTRAVENOUS | Status: AC
  Filled 2020-07-06: qty 20

## 2020-07-06 MED ORDER — CHLORHEXIDINE GLUCONATE 0.12 % MT SOLN
OROMUCOSAL | Status: AC
  Administered 2020-07-06: 15 mL via OROMUCOSAL
  Filled 2020-07-06: qty 15

## 2020-07-06 MED ORDER — CEFAZOLIN SODIUM-DEXTROSE 2-4 GM/100ML-% IV SOLN
INTRAVENOUS | Status: AC
  Filled 2020-07-06: qty 100

## 2020-07-06 SURGICAL SUPPLY — 7 items
BNDG ADH 2 X3.75 FABRIC TAN LF (GAUZE/BANDAGES/DRESSINGS) ×1 IMPLANT
BNDG ADH XL 3.75X2 STRCH LF (GAUZE/BANDAGES/DRESSINGS) ×1
NDL HYPO 18GX1.5 BLUNT FILL (NEEDLE) IMPLANT
NEEDLE HYPO 18GX1.5 BLUNT FILL (NEEDLE) ×4 IMPLANT
NEEDLE HYPO 22GX1.5 SAFETY (NEEDLE) ×1 IMPLANT
SLING ARM LRG DEEP (SOFTGOODS) ×2 IMPLANT
SYR 10ML LL (SYRINGE) ×1 IMPLANT

## 2020-07-06 NOTE — Anesthesia Procedure Notes (Signed)
Anesthesia Regional Block: Interscalene brachial plexus block   Pre-Anesthetic Checklist: ,, timeout performed, Correct Patient, Correct Site, Correct Laterality, Correct Procedure, Correct Position, site marked, Risks and benefits discussed,  Surgical consent,  Pre-op evaluation,  At surgeon's request and post-op pain management  Laterality: Right  Prep: chloraprep       Needles:  Injection technique: Single-shot  Needle Type: Stimiplex     Needle Length: 9cm  Needle Gauge: 21     Additional Needles:   Procedures:,,,, ultrasound used (permanent image in chart),,,,  Narrative:  Start time: 07/06/2020 11:08 AM End time: 07/06/2020 11:11 AM  Performed by: Personally  Anesthesiologist: Karleen Hampshire, MD  Additional Notes: Risks and benefits of nerve block discussed with patient, including but not limited to risk of nerve injury, bleeding, infection, and failed block.  Patient expressed understanding and consented to block placement.   Functioning IV was confirmed and monitors were applied.  Sterile prep,hand hygiene and sterile gloves were used.  Minimal sedation used for procedure.  During the procedure, there was negative aspiration, negative paresthesia on injection, and dose was given in divided aliquots under ultrasound guidance.  Patient tolerated the procedure well with no immediate complications.

## 2020-07-06 NOTE — H&P (Signed)
History of Present Illness: [name] is a 65 y.o. female who presents today for evaluation of ongoing right shoulder pain. The patient has been experiencing right shoulder pain since last year, the patient underwent a pneumonia vaccine which was placed in her right deltoid. The patient states that after the vaccine she began to experience moderate to severe pain in the right arm which has not improved. She states that over time she noticed that she was not able to move her arm without significant pain and she has noticed decreased range of motion to the right upper extremity. She does take chronic oxycodone for discomfort, she also takes ibuprofen as needed for discomfort without significant relief. She has had limited range of motion, pain is located along the deltoid, she describes it as a aching, throbbing discomfort. She does state that occasionally radiates into the elbow but denies any numbness or tingling to the right upper extremity. She is left-hand dominant. She denies any surgical history to the right shoulder. No fevers or chills at home. She has been using ice in addition to a TENS unit without significant relief. The patient was evaluated by internal medicine in September of last year, x-rays of the right shoulder were obtained which did demonstrate some mild calcific tendinopathic changes but no acute fracture or significant degenerative changes. She underwent a right subacromial steroid injection which she states did not provide any significant relief. Pain score today is a 9 out of 10 to the right shoulder. She is a diabetic. She denies any personal history of heart attack or stroke. She does have a history of COPD. No history of blood clots  Past Medical History: . Anxiety  . COPD (chronic obstructive pulmonary disease) (CMS-HCC)  . Depression  . Diabetes mellitus type 2, uncomplicated (CMS-HCC)   Past Surgical History: . CHOLECYSTECTOMY  . COLONOSCOPY 03/16/2019 (Collangenous  colitis/Repeat 20yr/TKT)  . EGD 03/16/2019 (GERD/No Repeat/TKT)  . HYSTERECTOMY   Past Family History: . Lung cancer Mother  . Breast cancer Mother  . High blood pressure (Hypertension) Father  . COPD Father   Medications: . albuterol 90 mcg/actuation inhaler Inhale 2 inhalations into the lungs every 6 (six) hours as needed for Wheezing 8.5 g 11  . ALPRAZolam (XANAX) 1 MG tablet Take 1 mg by mouth 2 (two) times daily as needed.  .Marland Kitchenaspirin 81 MG chewable tablet Take 81 mg by mouth once daily  . BELSOMRA 5 mg Tab Take 1 tablet by mouth once daily  . blood glucose meter kit Use as directed ONE TOUCH ULTRA METER E11.22 1 each 0  . blood sugar diagnostic, disc (BLOOD GLUCOSE DIAGNOSTIC, DISC) test strip Use 2 (two) times daily Use as instructed. ONE TOUCH ULTRA METER E11.22 200 each 3  . calcipotriene (DOVONEX) 0.005 % cream Apply topically nightly  . dextroamphetamine-amphetamine (ADDERALL) 15 mg tablet Take 1 tablet by mouth once daily  . dextroamphetamine-amphetamine (ADDERALL) 30 mg tablet Take 30 mg by mouth 2 (two) times daily.  . diclofenac (VOLTAREN) 1 % topical gel Apply topically continuously as needed  . empagliflozin (JARDIANCE) 25 mg tablet Take 1 tablet (25 mg total) by mouth once daily 90 tablet 3  . esomeprazole (NEXIUM) 40 MG DR capsule TAKE 1 CAPSULE BY MOUTH TWICE DAILY 180 capsule 1  . fluorouracil (EFUDEX) 5 % cream Apply topically 2 (two) times daily.  . fluticasone (FLONASE) 50 mcg/actuation nasal spray Place 1 spray into both nostrils 2 (two) times daily. 16 g 11  . fluticasone  propion-salmeteroL (ADVAIR DISKUS) 500-50 mcg/dose diskus inhaler Inhale 1 inhalation into the lungs every 12 (twelve) hours 3 Inhaler 11  . fluticasone propionate (FLONASE) 50 mcg/actuation nasal spray Place 2 sprays into both nostrils once daily 16 g 11  . gabapentin (NEURONTIN) 100 MG capsule Take 100 mg by mouth every morning.  . gabapentin (NEURONTIN) 300 MG capsule Take 300 mg by mouth  nightly.  Marland Kitchen glipiZIDE (GLUCOTROL XL) 10 MG XL tablet Take 1 tablet (10 mg total) by mouth 2 (two) times daily 180 tablet 3  . halobetasol (ULTRAVATE) 0.05 % cream Apply topically 2 (two) times daily.  Marland Kitchen ibuprofen (MOTRIN) 800 MG tablet Take by mouth continuously as needed  . ipratropium-albuterol (DUO-NEB) nebulizer solution 4 (four) times daily as needed. Inhale 3 mL by nebulization Four (4) times a day. Marland Kitchen JANUVIA 100 mg tablet Take by mouth once daily  . lancing device with lancets kit Use 1 each 2 (two) times daily Use as instructed. E11.22 200 each 3  . lisinopriL (ZESTRIL) 5 MG tablet Take 1 tablet (5 mg total) by mouth once daily 90 tablet 3  . mupirocin (BACTROBAN) 2 % ointment Apply topically once daily.  Marland Kitchen oxyCODONE (DAZIDOX) 10 mg immediate release tablet Take 1 tablet by mouth  . pioglitazone (ACTOS) 45 MG tablet Take 1 tablet (45 mg total) by mouth once daily 90 tablet 1  . polyethylene glycol (MIRALAX) powder Take as directed for colonic prep. 238 g 0  . rosuvastatin (CRESTOR) 20 MG tablet Take 1 tablet (20 mg total) by mouth once daily 90 tablet 11  . tiotropium (SPIRIVA WITH HANDIHALER) 18 mcg inhalation capsule Place 1 capsule (18 mcg total) into inhaler and inhale once daily 90 capsule 5  . tiZANidine (ZANAFLEX) 4 MG tablet 4 mg 3 (three) times daily as needed.  Marland Kitchen VIIBRYD 10 mg tablet Take 1 tablet by mouth once daily  . ZTLIDO 1.8 % PtMd Apply topically continuously as needed  . oxyCODONE-acetaminophen (PERCOCET) 7.5-325 mg tablet Take 1 tablet by mouth every 6 (six) hours as needed for Pain (Patient not taking: Reported on 06/22/2020 )   Allergies: . Hydroxyzine Other (Excessive sleeping)  . Metformin Diarrhea  . Semaglutide Other (GI upset)  . Trazodone Other (Makes her feel like she doesn't want to get up the next day)   Review of Systems:  A comprehensive 14 point ROS was performed, reviewed by me today, and the pertinent orthopaedic findings are documented in the  HPI.  Physical Exam: BP 122/80 (BP Location: Left upper arm, Patient Position: Sitting, BP Cuff Size: Large Adult)  Ht 154.9 cm ('5\' 1"' )  Wt 81.7 kg (180 lb 3.2 oz)  BMI 34.05 kg/m  General/Constitutional: The patient appears to be well-nourished, well-developed, and in no acute distress. Neuro/Psych: Normal mood and affect, oriented to person, place and time. Eyes: Non-icteric. Pupils are equal, round, and reactive to light, and exhibit synchronous movement. ENT: Unremarkable. Lymphatic: No palpable adenopathy. Respiratory: Lungs clear to auscultation, Normal chest excursion, No wheezes and Non-labored breathing Cardiovascular: Regular rate and rhythm. No murmurs. and No edema, swelling or tenderness, except as noted in detailed exam. Integumentary: No impressive skin lesions present, except as noted in detailed exam. Musculoskeletal: Unremarkable, except as noted in detailed exam.  General: Well developed, well nourished 65 y.o. female in no apparent distress. Normal affect. Normal communication. Patient answers questions appropriately. The patient has a normal gait. There is no antalgic component. There is no hip lurch.   Right Upper Extremity:  Examination of the right shoulder and arm showed no bony abnormality or edema. The patient is able to actively forward flex 90 degrees, active abduction 80 degrees. Passively she can achieve 120 degrees of forward flexion and 105 degrees of abduction. With the right arm abducted 90 degrees she can tolerate external rotation up to 60 degrees, internal rotation 30 degrees. She is able to internally rotate to level of the right superior iliac crest.. She is tender palpation over the lateral deltoid. No significant pain with palpation over the subacromial space. There is no subacromial space tenderness with no AC joint tenderness. The patient has no instability of the shoulder with anterior-posterior motion. There is a negative sulcus sign. Full strength  to the right upper extremity, difficult to completely test strength due to the lack of active motion. The patient is able to fully flex extend right elbow without significant pain. She is able to flex extend the right wrist without significant discomfort.  Neurological: The patient has sensation that is intact to light touch and pinprick bilaterally. The patient has normal grip strength. The patient has full biceps, wrist extension, grip, and interosseous strength. The patient has 2 + DTRs bilaterally.  Vascular: The patient has less than 2 second capillary refill. The patient has normal ulnar and radial pulses. The patient has normal warmth to touch.   Imaging: X-rays obtained by internal medicine are detailed in the HPI above.  AP, lateral and oblique images of the right elbow were obtained today in the office and reviewed by me. These x-rays demonstrate mild arthritic changes with small spur formation of the olecranon, no evidence of acute fracture dislocation. No joint effusion is identified. No lytic lesions noted.  Impression: 1. Right rotator cuff tendonitis. 2. Adhesive capsulitis of right shoulder.  Plan:  1. Treatment options were discussed today with the patient. 2. I believe that the patient likely did have a inflammatory response to the pneumonia vaccine however as she has compensated by not moving her right arm I believe the patient has developed a significant case of adhesive capsulitis of the right shoulder. Very limited passive range of motion at today's visit.  3. Discussed both conservative and aggressive treatment options for the right shoulder at this time. The patient states that she would like to proceed with the most aggressive therapy. 4. Risk and benefits of a closed manipulation under anesthesia versus arthroscopic debridement were discussed in detail at today's visit. Instructed the patient on the importance of formal physical therapy after surgery. 5. The patient  understands the risk and benefits and wishes to proceed. This document will serve as a surgical history and physical for the patient. 6. The patient will follow-up per standard postop protocol. They can call the clinic they have any questions, new symptoms develop or symptoms worsen.  The procedure was discussed with the patient, as were the potential risks (including bleeding, infection, nerve and/or blood vessel injury, persistent or recurrent pain, failure of the debridement, progression of arthritis, need for further surgery, blood clots, strokes, heart attacks and/or arhythmias, pneumonia, etc.) and benefits. The patient states her understanding and wishes to proceed.   H&P reviewed and patient re-examined. No changes.

## 2020-07-06 NOTE — Anesthesia Preprocedure Evaluation (Addendum)
Anesthesia Evaluation  Patient identified by MRN, date of birth, ID band Patient awake    Reviewed: Allergy & Precautions, H&P , NPO status , Patient's Chart, lab work & pertinent test results  History of Anesthesia Complications Negative for: history of anesthetic complications  Airway Mallampati: II  TM Distance: >3 FB     Dental  (+) Edentulous Upper, Missing   Pulmonary neg sleep apnea, COPD, former smoker,     + wheezing      Cardiovascular (-) angina(-) Past MI and (-) Cardiac Stents negative cardio ROS  (-) dysrhythmias  Rhythm:regular Rate:Normal     Neuro/Psych negative neurological ROS  negative psych ROS   GI/Hepatic negative GI ROS, Neg liver ROS,   Endo/Other  diabetes  Renal/GU negative Renal ROS  negative genitourinary   Musculoskeletal   Abdominal   Peds  Hematology negative hematology ROS (+)   Anesthesia Other Findings Past Medical History: No date: Aortic atherosclerosis (HCC) No date: Back pain No date: COPD (chronic obstructive pulmonary disease) (HCC) No date: DDD (degenerative disc disease), lumbar No date: Diabetes mellitus without complication (HCC) No date: Hypercholesteremia  Past Surgical History: No date: ABDOMINAL HYSTERECTOMY No date: BREAST BIOPSY No date: CHOLECYSTECTOMY  BMI    Body Mass Index: 34.01 kg/m      Reproductive/Obstetrics negative OB ROS                            Anesthesia Physical Anesthesia Plan  ASA: II  Anesthesia Plan: General   Post-op Pain Management: GA combined w/ Regional for post-op pain   Induction:   PONV Risk Score and Plan:   Airway Management Planned: Simple Face Mask  Additional Equipment:   Intra-op Plan:   Post-operative Plan:   Informed Consent: I have reviewed the patients History and Physical, chart, labs and discussed the procedure including the risks, benefits and alternatives for the  proposed anesthesia with the patient or authorized representative who has indicated his/her understanding and acceptance.     Dental Advisory Given  Plan Discussed with: Anesthesiologist, CRNA and Surgeon  Anesthesia Plan Comments:        Anesthesia Quick Evaluation

## 2020-07-06 NOTE — Discharge Instructions (Addendum)
Orthopedic discharge instructions: May shower once block has worn off. Apply ice frequently to shoulder. Take ibuprofen 600-800 mg TID with meals for 5-7 days, then as necessary. Take oxycodone as prescribed when needed.  May supplement with ES Tylenol if necessary. Keep shoulder sling on until the block wears off, then try to go without sling and begin to use arm with normal daily activities. Start physical therapy tomorrow as scheduled. Follow-up in 10-14 days or as scheduled.   AMBULATORY SURGERY  DISCHARGE INSTRUCTIONS   1) The drugs that you were given will stay in your system until tomorrow so for the next 24 hours you should not:  A) Drive an automobile B) Make any legal decisions C) Drink any alcoholic beverage   2) You may resume regular meals tomorrow.  Today it is better to start with liquids and gradually work up to solid foods.  You may eat anything you prefer, but it is better to start with liquids, then soup and crackers, and gradually work up to solid foods.   3) Please notify your doctor immediately if you have any unusual bleeding, trouble breathing, redness and pain at the surgery site, drainage, fever, or pain not relieved by medication.  4) Your post-operative visit with Dr.                                     is: Date:                        Time:    Please call to schedule your post-operative visit.  5) Additional Instructions:

## 2020-07-06 NOTE — Transfer of Care (Signed)
Immediate Anesthesia Transfer of Care Note  Patient: [name]  Procedure(s) Performed: CLOSED MANIPULATION UNDER ANESTHESIA OF RIGHT SHOULDER WITH STEROID INJECTION VERSUS ARTHROSCOPIC DEBRIDEMENT. (Right Shoulder)  Patient Location: PACU  Anesthesia Type:General  Level of Consciousness: awake, alert  and oriented  Airway & Oxygen Therapy: Patient Spontanous Breathing and Patient connected to face mask oxygen  Post-op Assessment: Post -op Vital signs reviewed and stable  Post vital signs: stable  Last Vitals:  Vitals Value Taken Time  BP 100/37   Temp 97.30F   Pulse 64   Resp 15   SpO2 100     Last Pain:  Vitals:   07/06/20 1119  TempSrc:   PainSc: 0-No pain      Patients Stated Pain Goal: 3 (07/06/20 1014)  Complications: No complications documented.

## 2020-07-06 NOTE — Op Note (Signed)
07/06/2020  11:50 AM  Patient:   [name]  Pre-Op Diagnosis:   Primary adhesive capsulitis, right shoulder.  Post-Op Diagnosis:   Same  Procedure:   Manipulation under anesthesia with steroid injection, right shoulder.  Surgeon:   Maryagnes Amos, MD  Assistant:   None  Anesthesia:   IV sedation with interscalene block placed preoperatively by anesthesiologist.  Findings:   As above. Prior to manipulation, the right shoulder could be forward flexed to 115 and abducted to 110. At 90 of abduction, the shoulder could be externally rotated to 55 and internally rotated to 30. Following manipulation, the shoulder could be forward flexed to 170, abducted to 165 and, at 90 of abduction, externally rotated to 90 and internally rotated to 70.  Complications:   None  EBL:   0 cc  Fluids:   250 cc crystalloid  TT:   None  Drains:   None  Closure:   None  Brief Clinical Note:   The patient is a 65 year old female with a 1 year history of right shoulder pain and stiffness following a flu shot. Despite extensive physical therapy, the patient continues to have difficulty regaining shoulder range of motion. The patient's history and examination are consistent with primary adhesive capsulitis. The patient presents at this time for a manipulation under anesthesia with steroid injection of the left shoulder.  Procedure:   The patient underwent placement of an interscalene block in the preoperative holding area before being brought into the operating room and lain in the supine position. After adequate IV sedation was achieved, a timeout was performed to verify the correct surgical site. The left shoulder was gently manipulated in both abduction and external rotation, as well as adduction and internal rotation. Several palpable and audible pops were heard as the scar tissue released, permitting full range of motion of the shoulder. The glenohumeral joint was injected sterilely using 1 cc  of Kenalog-40 and 9 cc of 0.5% Sensorcaine with epinephrine before the patient was placed into a sling. The patient was then awakened and returned to the recovery room in satisfactory condition after tolerating the procedure well.

## 2020-07-07 ENCOUNTER — Encounter: Payer: Self-pay | Admitting: Surgery

## 2020-07-12 NOTE — Anesthesia Postprocedure Evaluation (Signed)
Anesthesia Post Note  Patient: [name]  Procedure(s) Performed: CLOSED MANIPULATION UNDER ANESTHESIA OF RIGHT SHOULDER WITH STEROID INJECTION VERSUS ARTHROSCOPIC DEBRIDEMENT. (Right Shoulder)  Patient location during evaluation: PACU Anesthesia Type: General Level of consciousness: awake and alert Pain management: pain level controlled Vital Signs Assessment: post-procedure vital signs reviewed and stable Respiratory status: spontaneous breathing, nonlabored ventilation and respiratory function stable Cardiovascular status: blood pressure returned to baseline and stable Postop Assessment: no apparent nausea or vomiting Anesthetic complications: no   No complications documented.   Last Vitals:  Vitals:   07/06/20 1245 07/06/20 1338  BP:  94/63  Pulse:  63  Resp:  15  Temp: (!) 36.3 C 36.5 C  SpO2:  93%    Last Pain:  Vitals:   07/07/20 0900  TempSrc:   PainSc: 0-No pain                 Aurelio Brash Nycholas Rayner

## 2020-12-20 ENCOUNTER — Other Ambulatory Visit: Payer: Self-pay | Admitting: Internal Medicine

## 2020-12-20 DIAGNOSIS — Z1231 Encounter for screening mammogram for malignant neoplasm of breast: Secondary | ICD-10-CM

## 2021-02-20 ENCOUNTER — Other Ambulatory Visit: Payer: Self-pay

## 2021-02-20 ENCOUNTER — Ambulatory Visit
Admission: RE | Admit: 2021-02-20 | Discharge: 2021-02-20 | Disposition: A | Discharge status: Home / Self Care | Payer: Medicare Other | Source: Ambulatory Visit | Attending: Internal Medicine | Admitting: Internal Medicine

## 2021-02-20 DIAGNOSIS — Z1231 Encounter for screening mammogram for malignant neoplasm of breast: Secondary | ICD-10-CM | POA: Diagnosis not present

## 2021-11-13 ENCOUNTER — Ambulatory Visit (INDEPENDENT_AMBULATORY_CARE_PROVIDER_SITE_OTHER): Payer: Medicare Other | Admitting: Dermatology

## 2021-11-13 DIAGNOSIS — L820 Inflamed seborrheic keratosis: Secondary | ICD-10-CM | POA: Diagnosis not present

## 2021-11-13 DIAGNOSIS — L570 Actinic keratosis: Secondary | ICD-10-CM | POA: Diagnosis not present

## 2021-11-13 DIAGNOSIS — L988 Other specified disorders of the skin and subcutaneous tissue: Secondary | ICD-10-CM

## 2021-11-13 DIAGNOSIS — L578 Other skin changes due to chronic exposure to nonionizing radiation: Secondary | ICD-10-CM | POA: Diagnosis not present

## 2021-11-13 DIAGNOSIS — L814 Other melanin hyperpigmentation: Secondary | ICD-10-CM

## 2021-11-13 NOTE — Progress Notes (Signed)
New Patient Visit  Subjective  [name] is a 66 y.o. female who presents for the following: New Patient (Initial Visit).  Patient presents for spots on the face and forearms. She has seen a dermatologist in the past to have areas treated with cryotherapy. No history of skin cancer.   The following portions of the chart were reviewed this encounter and updated as appropriate:       Review of Systems:  No other skin or systemic complaints except as noted in HPI or Assessment and Plan.  Objective  Well appearing patient in no apparent distress; mood and affect are within normal limits.  A focused examination was performed including face, arms. Relevant physical exam findings are noted in the Assessment and Plan.  R mid forearm x 3, L mid forearm x 1 (4) Firm pink flesh keratotic papules and plaques.  Nose x 2, L malar cheek x 1, L lat forehead x 1, R forehead x 3, R temple x 1, L hand dorsum x 1 (9) Pink brown keratotic macules.  face Rhytides and volume loss.     Assessment & Plan  Actinic Damage - Severe, confluent actinic changes with pre-cancerous actinic keratoses  - Severe, chronic, not at goal, secondary to cumulative UV radiation exposure over time - diffuse scaly erythematous macules and papules with underlying dyspigmentation - Discussed Prescription "Field Treatment" for Severe, Chronic Confluent Actinic Changes with Pre-Cancerous Actinic Keratoses Field treatment involves treatment of an entire area of skin that has confluent Actinic Changes (Sun/ Ultraviolet light damage) and PreCancerous Actinic Keratoses by method of PhotoDynamic Therapy (PDT) and/or prescription Topical Chemotherapy agents such as 5-fluorouracil, 5-fluorouracil/calcipotriene, and/or imiquimod.  The purpose is to decrease the number of clinically evident and subclinical PreCancerous lesions to prevent progression to development of skin cancer by chemically destroying early precancer changes  that may or may not be visible.  It has been shown to reduce the risk of developing skin cancer in the treated area. As a result of treatment, redness, scaling, crusting, and open sores may occur during treatment course. One or more than one of these methods may be used and may have to be used several times to control, suppress and eliminate the PreCancerous changes. Discussed treatment course, expected reaction, and possible side effects. - Recommend daily broad spectrum sunscreen SPF 30+ to sun-exposed areas, reapply every 2 hours as needed.  - Staying in the shade or wearing long sleeves, sun glasses (UVA+UVB protection) and wide brim hats (4-inch brim around the entire circumference of the hat) are also recommended. - Call for new or changing lesions. - recommend PDT to face x 2, and arms x 2, 1 mo apart  Lentigines - Scattered tan macules - Due to sun exposure - Benign-appering, observe - Recommend daily broad spectrum sunscreen SPF 30+ to sun-exposed areas, reapply every 2 hours as needed. - Call for any changes  Inflamed seborrheic keratosis (4) R mid forearm x 3, L mid forearm x 1  vs Prurigo Nodules  Avoid picking.  Destruction of lesion - R mid forearm x 3, L mid forearm x 1  Destruction method: cryotherapy   Informed consent: discussed and consent obtained   Lesion destroyed using liquid nitrogen: Yes   Region frozen until ice ball extended beyond lesion: Yes   Outcome: patient tolerated procedure well with no complications   Post-procedure details: wound care instructions given   Additional details:  Prior to procedure, discussed risks of blister formation, small wound, skin dyspigmentation,  or rare scar following cryotherapy. Recommend Vaseline ointment to treated areas while healing.   AK (actinic keratosis) (9) Nose x 2, L malar cheek x 1, L lat forehead x 1, R forehead x 3, R temple x 1, L hand dorsum x 1  vs ISKs  Actinic keratoses are precancerous spots that  appear secondary to cumulative UV radiation exposure/sun exposure over time. They are chronic with expected duration over 1 year. A portion of actinic keratoses will progress to squamous cell carcinoma of the skin. It is not possible to reliably predict which spots will progress to skin cancer and so treatment is recommended to prevent development of skin cancer.  Recommend daily broad spectrum sunscreen SPF 30+ to sun-exposed areas, reapply every 2 hours as needed.  Recommend staying in the shade or wearing long sleeves, sun glasses (UVA+UVB protection) and wide brim hats (4-inch brim around the entire circumference of the hat). Call for new or changing lesions.  Destruction of lesion - Nose x 2, L malar cheek x 1, L lat forehead x 1, R forehead x 3, R temple x 1, L hand dorsum x 1  Destruction method: cryotherapy   Informed consent: discussed and consent obtained   Lesion destroyed using liquid nitrogen: Yes   Region frozen until ice ball extended beyond lesion: Yes   Outcome: patient tolerated procedure well with no complications   Post-procedure details: wound care instructions given   Additional details:  Prior to procedure, discussed risks of blister formation, small wound, skin dyspigmentation, or rare scar following cryotherapy. Recommend Vaseline ointment to treated areas while healing.   Elastosis of skin face  Discussed Botox injections, fillers, retinoids, non-covered.    Return in about 4 months (around 03/15/2022) for AKs, ISKs/PN. May schedule PDT to face x 2, 1 mo apart. Also may schedule treatment to arms.Wendee Beavers, CMA, am acting as scribe for Willeen Niece, MD .  Documentation: I have reviewed the above documentation for accuracy and completeness, and I agree with the above.  Willeen Niece MD

## 2021-11-13 NOTE — Patient Instructions (Addendum)
Cryotherapy Aftercare  Wash gently with soap and water everyday.   Apply Vaseline and Band-Aid daily until healed.     Due to recent changes in healthcare laws, you may see results of your pathology and/or laboratory studies on MyChart before the doctors have had a chance to review them. We understand that in some cases there may be results that are confusing or concerning to you. Please understand that not all results are received at the same time and often the doctors may need to interpret multiple results in order to provide you with the best plan of care or course of treatment. Therefore, we ask that you please give us 2 business days to thoroughly review all your results before contacting the office for clarification. Should we see a critical lab result, you will be contacted sooner.   If You Need Anything After Your Visit  If you have any questions or concerns for your doctor, please call our main line at [phone] and press option 4 to reach your doctor's medical assistant. If no one answers, please leave a voicemail as directed and we will return your call as soon as possible. Messages left after 4 pm will be answered the following business day.   You may also send us a message via MyChart. We typically respond to MyChart messages within 1-2 business days.  For prescription refills, please ask your pharmacy to contact our office. Our fax number is [phone].  If you have an urgent issue when the clinic is closed that cannot wait until the next business day, you can page your doctor at the number below.    Please note that while we do our best to be available for urgent issues outside of office hours, we are not available 24/7.   If you have an urgent issue and are unable to reach us, you may choose to seek medical care at your doctor's office, retail clinic, urgent care center, or emergency room.  If you have a medical emergency, please immediately call 911 or go to the  emergency department.  Pager Numbers  - Dr. Kowalski: [phone]  - Dr. Moye: [phone]  - Dr. Stewart: [phone]  In the event of inclement weather, please call our main line at [phone] for an update on the status of any delays or closures.  Dermatology Medication Tips: Please keep the boxes that topical medications come in in order to help keep track of the instructions about where and how to use these. Pharmacies typically print the medication instructions only on the boxes and not directly on the medication tubes.   If your medication is too expensive, please contact our office at [phone] option 4 or send us a message through MyChart.   We are unable to tell what your co-pay for medications will be in advance as this is different depending on your insurance coverage. However, we may be able to find a substitute medication at lower cost or fill out paperwork to get insurance to cover a needed medication.   If a prior authorization is required to get your medication covered by your insurance company, please allow us 1-2 business days to complete this process.  Drug prices often vary depending on where the prescription is filled and some pharmacies may offer cheaper prices.  The website www.goodrx.com contains coupons for medications through different pharmacies. The prices here do not account for what the cost may be with help from insurance (it may be cheaper with your insurance), but the website can   give you the price if you did not use any insurance.  - You can print the associated coupon and take it with your prescription to the pharmacy.  - You may also stop by our office during regular business hours and pick up a GoodRx coupon card.  - If you need your prescription sent electronically to a different pharmacy, notify our office through [place] MyChart or by phone at [phone] option 4.     Si Usted Necesita Algo Despus de Su Visita  Tambin puede  enviarnos un mensaje a travs de MyChart. Por lo general respondemos a los mensajes de MyChart en el transcurso de 1 a 2 das hbiles.  Para renovar recetas, por favor pida a su farmacia que se ponga en contacto con nuestra oficina. Nuestro nmero de fax es el [phone].  Si tiene un asunto urgente cuando la clnica est cerrada y que no puede esperar hasta el siguiente da hbil, puede llamar/localizar a su doctor(a) al nmero que aparece a continuacin.   Por favor, tenga en cuenta que aunque hacemos todo lo posible para estar disponibles para asuntos urgentes fuera del horario de oficina, no estamos disponibles las 24 horas del da, los 7 das de la semana.   Si tiene un problema urgente y no puede comunicarse con nosotros, puede optar por buscar atencin mdica  en el consultorio de su doctor(a), en una clnica privada, en un centro de atencin urgente o en una sala de emergencias.  Si tiene una emergencia mdica, por favor llame inmediatamente al 911 o vaya a la sala de emergencias.  Nmeros de bper  - Dr. Kowalski: [phone]  - Dra. Moye: [phone]  - Dra. Stewart: [phone]  En caso de inclemencias del tiempo, por favor llame a nuestra lnea principal al [phone] para una actualizacin sobre el estado de cualquier retraso o cierre.  Consejos para la medicacin en dermatologa: Por favor, guarde las cajas en las que vienen los medicamentos de uso tpico para ayudarle a seguir las instrucciones sobre dnde y cmo usarlos. Las farmacias generalmente imprimen las instrucciones del medicamento slo en las cajas y no directamente en los tubos del medicamento.   Si su medicamento es muy caro, por favor, pngase en contacto con nuestra oficina llamando al [phone] y presione la opcin 4 o envenos un mensaje a travs de MyChart.   No podemos decirle cul ser su copago por los medicamentos por adelantado ya que esto es diferente dependiendo de la cobertura de su seguro.  Sin embargo, es posible que podamos encontrar un medicamento sustituto a menor costo o llenar un formulario para que el seguro cubra el medicamento que se considera necesario.   Si se requiere una autorizacin previa para que su compaa de seguros cubra su medicamento, por favor permtanos de 1 a 2 das hbiles para completar este proceso.  Los precios de los medicamentos varan con frecuencia dependiendo del lugar de dnde se surte la receta y alguna farmacias pueden ofrecer precios ms baratos.  El sitio web www.goodrx.com tiene cupones para medicamentos de diferentes farmacias. Los precios aqu no tienen en cuenta lo que podra costar con la ayuda del seguro (puede ser ms barato con su seguro), pero el sitio web puede darle el precio si no utiliz ningn seguro.  - Puede imprimir el cupn correspondiente y llevarlo con su receta a la farmacia.  - Tambin puede pasar por nuestra oficina durante el horario de atencin regular y recoger una tarjeta de cupones de GoodRx.  -   Si necesita que su receta se enve electrnicamente a una farmacia diferente, informe a nuestra oficina a travs de MyChart de  o por telfono llamando al [phone] y presione la opcin 4.  

## 2021-12-25 ENCOUNTER — Ambulatory Visit (INDEPENDENT_AMBULATORY_CARE_PROVIDER_SITE_OTHER): Payer: Medicare Other | Admitting: Dermatology

## 2021-12-25 DIAGNOSIS — L570 Actinic keratosis: Secondary | ICD-10-CM | POA: Diagnosis not present

## 2021-12-25 MED ORDER — AMINOLEVULINIC ACID HCL 20 % EX SOLR
1.0000 | Freq: Once | CUTANEOUS | Status: AC
  Administered 2021-12-25: 354 mg via TOPICAL

## 2021-12-25 NOTE — Progress Notes (Signed)
Patient completed PDT therapy today.  1. AK (actinic keratosis) Head - Anterior (Face)  Photodynamic therapy - Head - Anterior (Face) Procedure discussed: discussed risks, benefits, side effects. and alternatives   Prep: site scrubbed/prepped with acetone   Location:  Face Number of lesions:  Multiple Type of treatment:  Blue light Aminolevulinic Acid (see MAR for details): Levulan Number of Levulan sticks used:  1 Incubation time (minutes):  60 Number of minutes under lamp:  16 Number of seconds under lamp:  40 Cooling:  Floor fan Outcome: patient tolerated procedure well with no complications   Post-procedure details: sunscreen applied and aftercare instructions given to patient    [name]  Documentation: I have reviewed the above documentation for accuracy and completeness, and I agree with the above.  Brendolyn Patty MD

## 2021-12-25 NOTE — Patient Instructions (Signed)

## 2022-01-28 ENCOUNTER — Ambulatory Visit (INDEPENDENT_AMBULATORY_CARE_PROVIDER_SITE_OTHER): Payer: Medicare Other | Admitting: Dermatology

## 2022-01-28 DIAGNOSIS — L570 Actinic keratosis: Secondary | ICD-10-CM | POA: Diagnosis not present

## 2022-01-28 MED ORDER — AMINOLEVULINIC ACID HCL 20 % EX SOLR
1.0000 | Freq: Once | CUTANEOUS | Status: AC
  Administered 2022-01-28: 354 mg via TOPICAL

## 2022-01-28 NOTE — Progress Notes (Signed)
Patient completed PDT therapy today.  1. AK (actinic keratosis) Head - Anterior (Face)  Photodynamic therapy - Head - Anterior (Face) Procedure discussed: discussed risks, benefits, side effects. and alternatives   Prep: site scrubbed/prepped with acetone   Location:  Face Number of lesions:  Multiple Type of treatment:  Blue light Aminolevulinic Acid (see MAR for details): Levulan Number of Levulan sticks used:  1 Incubation time (minutes):  60 Number of minutes under lamp:  16 Number of seconds under lamp:  40 Cooling:  Floor fan Outcome: patient tolerated procedure well with no complications   Post-procedure details: sunscreen applied and aftercare instructions given to patient    Related Medications Aminolevulinic Acid HCl 20 % SOLR 354 mg   [id number]  Abby Foster RMA

## 2022-01-28 NOTE — Patient Instructions (Signed)

## 2022-04-08 ENCOUNTER — Ambulatory Visit (INDEPENDENT_AMBULATORY_CARE_PROVIDER_SITE_OTHER): Payer: Medicare HMO | Admitting: Dermatology

## 2022-04-08 DIAGNOSIS — L988 Other specified disorders of the skin and subcutaneous tissue: Secondary | ICD-10-CM

## 2022-04-08 DIAGNOSIS — L578 Other skin changes due to chronic exposure to nonionizing radiation: Secondary | ICD-10-CM

## 2022-04-08 DIAGNOSIS — L821 Other seborrheic keratosis: Secondary | ICD-10-CM | POA: Diagnosis not present

## 2022-04-08 DIAGNOSIS — L570 Actinic keratosis: Secondary | ICD-10-CM | POA: Diagnosis not present

## 2022-04-08 DIAGNOSIS — L814 Other melanin hyperpigmentation: Secondary | ICD-10-CM | POA: Diagnosis not present

## 2022-04-08 MED ORDER — TRETINOIN 0.05 % EX CREA: TOPICAL_CREAM | Freq: Every day | CUTANEOUS | 2 refills | Status: DC

## 2022-04-08 NOTE — Patient Instructions (Addendum)
Cryotherapy Aftercare  Wash gently with soap and water everyday.   Apply Vaseline and Band-Aid daily until healed.    Topical retinoid medications like tretinoin/Retin-A, adapalene/Differin, tazarotene/Fabior, and Epiduo/Epiduo Forte can cause dryness and irritation when first started. Only apply a pea-sized amount to the entire affected area. Avoid applying it around the eyes, edges of mouth and creases at the nose. If you experience irritation, use a good moisturizer first and/or apply the medicine less often. If you are doing well with the medicine, you can increase how often you use it until you are applying every night. Be careful with sun protection while using this medication as it can make you sensitive to the sun. This medicine should not be used by pregnant women.   Basic OTC daily skin care regimen to prevent photoaging:   Recommend facial moisturizer with sunscreen SPF 30 every morning (brands include CeraVe AM, Neutrogena, Eucerin, Cetaphil, Aveeno, La Roche Posay).  Can also apply a topical Vit C serum which is an antioxidant (brands include CeraVe, La Roche Posay, and The Ordinary) underneath sunscreen in morning. If you are outside during the day in the summer for extended periods, especially swimming and/or sweating, make sure you apply a water resistant facial sunscreen lotion spf 30 or higher.   At night recommend a cream with retinol (a vitamin A derivative which stimulates collagen production) like CeraVe skin renewing retinol serum or ROC retinol correxion cream or Neutrogena rapid wrinkle repair cream. Retinol may cause skin irritation in people with sensitive skin.  Can use it every other day and/or apply on top of a hyaluronic acid (HA) moisturizer/serum if better tolerated that way.  Retinol may also help with lightening age spots.   Our office sells high quality, medically tested skin care lines such as Elta MD sunscreens (with Zinc), and Alastin skin care products, which are  very effective in treating photoaging. The Alastin line includes cosmeceutical grade Vit.C serum, HA serum, Elastin stimulating moisturizers/serums, lightening serum, and sunscreens.  If you want prescription treatment, then you would need an appointment (Rx tretinoin and fade creams, Botox, filler injections, laser treatments, etc.) These prescriptions and procedures are not covered by insurance but work very well.   Due to recent changes in healthcare laws, you may see results of your pathology and/or laboratory studies on MyChart before the doctors have had a chance to review them. We understand that in some cases there may be results that are confusing or concerning to you. Please understand that not all results are received at the same time and often the doctors may need to interpret multiple results in order to provide you with the best plan of care or course of treatment. Therefore, we ask that you please give [place] 2 business days to thoroughly review all your results before contacting the office for clarification. Should we see a critical lab result, you will be contacted sooner.   If You Need Anything After Your Visit  If you have any questions or concerns for your doctor, please call our main line at [phone] and press option 4 to reach your doctor's medical assistant. If no one answers, please leave a voicemail as directed and we will return your call as soon as possible. Messages left after 4 pm will be answered the following business day.   You may also send [place] a message via MyChart. We typically respond to MyChart messages within 1-2 business days.  For prescription refills, please ask your pharmacy to contact our office.  Our fax number is [phone].  If you have an urgent issue when the clinic is closed that cannot wait until the next business day, you can page your doctor at the number below.    Please note that while we do our best to be available for urgent issues outside of  office hours, we are not available 24/7.   If you have an urgent issue and are unable to reach [place], you may choose to seek medical care at your doctor's office, retail clinic, urgent care center, or emergency room.  If you have a medical emergency, please immediately call 911 or go to the emergency department.  Pager Numbers  - Dr. Nehemiah Massed: [phone]  - Dr. Laurence Ferrari: [phone]  - Dr. Nicole Kindred: [phone]  In the event of inclement weather, please call our main line at [phone] for an update on the status of any delays or closures.  Dermatology Medication Tips: Please keep the boxes that topical medications come in in order to help keep track of the instructions about where and how to use these. Pharmacies typically print the medication instructions only on the boxes and not directly on the medication tubes.   If your medication is too expensive, please contact our office at [phone] option 4 or send [place] a message through [place].   We are unable to tell what your co-pay for medications will be in advance as this is different depending on your insurance coverage. However, we may be able to find a substitute medication at lower cost or fill out paperwork to get insurance to cover a needed medication.   If a prior authorization is required to get your medication covered by your insurance company, please allow [place] 1-2 business days to complete this process.  Drug prices often vary depending on where the prescription is filled and some pharmacies may offer cheaper prices.  The website www.goodrx.com contains coupons for medications through different pharmacies. The prices here do not account for what the cost may be with help from insurance (it may be cheaper with your insurance), but the website can give you the price if you did not use any insurance.  - You can print the associated coupon and take it with your prescription to the pharmacy.  - You may also stop by our office during  regular business hours and pick up a GoodRx coupon card.  - If you need your prescription sent electronically to a different pharmacy, notify our office through [facility] or by phone at [phone] option 4.     Si Usted Necesita Algo Despus de Su Visita  Tambin puede enviarnos un mensaje a travs de Pharmacist, community. Por lo general respondemos a los mensajes de MyChart en el transcurso de 1 a 2 das hbiles.  Para renovar recetas, por favor pida a su farmacia que se ponga en contacto con nuestra oficina. Harland Dingwall de fax es [place] [phone].  Si tiene un asunto urgente cuando la clnica est cerrada y que no puede esperar hasta el siguiente da hbil, puede llamar/localizar a su doctor(a) al nmero que aparece a continuacin.   Por favor, tenga en cuenta que aunque hacemos todo lo posible para estar disponibles para asuntos urgentes fuera del horario de [place], no estamos disponibles las 24 horas del da, los 7 das de la [place].   Si tiene un problema urgente y no puede comunicarse con nosotros, puede optar por buscar atencin mdica  en el consultorio de su doctor(a), en una clnica privada, en un  centro de atencin urgente o en una sala de emergencias.  Si tiene Engineer, drilling, por favor llame inmediatamente al 911 o vaya a la sala de emergencias.  Nmeros de bper  - Dr. Gwen Pounds: [phone]  - Dra. Moye: [phone]  - Dra. Roseanne Reno: [phone]  En caso de inclemencias del [place], por favor llame a Lacy Duverney principal al [phone] para una actualizacin sobre el [place] de cualquier retraso o cierre.  Consejos para la medicacin en dermatologa: Por favor, guarde las cajas en las que vienen los medicamentos de uso tpico para ayudarle a seguir las instrucciones sobre dnde y cmo usarlos. Las farmacias generalmente imprimen las instrucciones del medicamento slo en las cajas y no directamente en los tubos del [place].   Si su medicamento es muy  caro, por favor, pngase en contacto con Rolm Gala llamando al [phone] y presione la opcin 4 o envenos un mensaje a travs de Clinical cytogeneticist.   No podemos decirle cul ser su copago por los medicamentos por adelantado ya que esto es diferente dependiendo de la cobertura de su seguro. Sin embargo, es posible que podamos encontrar un medicamento sustituto a Audiological scientist un formulario para que el seguro cubra el medicamento que se considera necesario.   Si se requiere una autorizacin previa para que su compaa de seguros [place] su medicamento, por favor permtanos de 1 a 2 das hbiles para completar [address]  Los precios de los medicamentos varan con frecuencia dependiendo del Environmental consultant de dnde se surte la receta y alguna farmacias pueden ofrecer precios ms baratos.  El sitio web www.goodrx.com tiene cupones para medicamentos de Health and safety inspector. Los precios aqu no tienen en cuenta lo que podra costar con la ayuda del seguro (puede ser ms barato con su seguro), pero el sitio web puede darle el precio si no utiliz Tourist information centre manager.  - Puede imprimir el cupn correspondiente y llevarlo con su receta a la farmacia.  - Tambin puede pasar por nuestra oficina durante el horario de atencin regular y Education officer, museum una tarjeta de cupones de GoodRx.  - Si necesita que su receta se enve electrnicamente a una farmacia diferente, informe a nuestra oficina a travs de MyChart de  o por telfono llamando al [phone] y presione la opcin 4.

## 2022-04-08 NOTE — Progress Notes (Signed)
Follow-Up Visit   Subjective  [name] is a 67 y.o. female who presents for the following: Follow-up (Patient here today for AK and ISK follow up. Patient has had PDT to face x 2 since last appointment. ISK's at R mid forearm, L mid forearm and AK's at Nose, L malar cheek, L lat forehead, R forehead, R temple, L hand dorsum treated with LN2.).  Patient would like to ask about using tretinoin for wrinkles.   The following portions of the chart were reviewed this encounter and updated as appropriate:       Review of Systems:  No other skin or systemic complaints except as noted in HPI or Assessment and Plan.  Objective  Well appearing patient in no apparent distress; mood and affect are within normal limits.  A focused examination was performed including arms, face. Relevant physical exam findings are noted in the Assessment and Plan.  Nose x 3, L mid forearm x 1, R hand dorsum x 2, R forearm x 2 (8) Erythematous thin papules/macules with gritty scale.   face Rhytides and volume loss.     Assessment & Plan  AK (actinic keratosis) (8) Nose x 3, L mid forearm x 1, R hand dorsum x 2, R forearm x 2  Actinic keratoses are precancerous spots that appear secondary to cumulative UV radiation exposure/sun exposure over time. They are chronic with expected duration over 1 year. A portion of actinic keratoses will progress to squamous cell carcinoma of the skin. It is not possible to reliably predict which spots will progress to skin cancer and so treatment is recommended to prevent development of skin cancer.  Recommend daily broad spectrum sunscreen SPF 30+ to sun-exposed areas, reapply every 2 hours as needed.  Recommend staying in the shade or wearing long sleeves, sun glasses (UVA+UVB protection) and wide brim hats (4-inch brim around the entire circumference of the hat). Call for new or changing lesions.  Recheck right nasal dorsum on follow up.   Destruction of lesion -  Nose x 3, L mid forearm x 1, R hand dorsum x 2, R forearm x 2  Destruction method: cryotherapy   Informed consent: discussed and consent obtained   Lesion destroyed using liquid nitrogen: Yes   Region frozen until ice ball extended beyond lesion: Yes   Outcome: patient tolerated procedure well with no complications   Post-procedure details: wound care instructions given   Additional details:  Prior to procedure, discussed risks of blister formation, small wound, skin dyspigmentation, or rare scar following cryotherapy. Recommend Vaseline ointment to treated areas while healing.   Elastosis of skin face  Start tretinoin 0.05% cream nightly as tolerated.   Topical retinoid medications like tretinoin/Retin-A, adapalene/Differin, tazarotene/Fabior, and Epiduo/Epiduo Forte can cause dryness and irritation when first started. Only apply a pea-sized amount to the entire affected area. Avoid applying it around the eyes, edges of mouth and creases at the nose. If you experience irritation, use a good moisturizer first and/or apply the medicine less often. If you are doing well with the medicine, you can increase how often you use it until you are applying every night. Be careful with sun protection while using this medication as it can make you sensitive to the sun. This medicine should not be used by pregnant women.  Recommend daily broad spectrum sunscreen SPF 30+ to sun-exposed areas, reapply every 2 hours as needed. Call for new or changing lesions.  Staying in the shade or wearing long sleeves, sun  glasses (UVA+UVB protection) and wide brim hats (4-inch brim around the entire circumference of the hat) are also recommended for sun protection.      tretinoin (RETIN-A) 0.05 % cream - face Apply topically at bedtime.  Actinic Damage - chronic, secondary to cumulative UV radiation exposure/sun exposure over time - diffuse scaly erythematous macules with underlying dyspigmentation - Recommend daily  broad spectrum sunscreen SPF 30+ to sun-exposed areas, reapply every 2 hours as needed.  - Recommend staying in the shade or wearing long sleeves, sun glasses (UVA+UVB protection) and wide brim hats (4-inch brim around the entire circumference of the hat). - Call for new or changing lesions.  Lentigines - Scattered tan macules - Due to sun exposure - Benign-appearing, observe - Recommend daily broad spectrum sunscreen SPF 30+ to sun-exposed areas, reapply every 2 hours as needed. - Call for any changes  Seborrheic Keratoses - Stuck-on, waxy, tan-brown papules and/or plaques  - Benign-appearing - Discussed benign etiology and prognosis. - Observe - Call for any changes  Return in about 6 months (around 10/07/2022) for recheck right nasal dorsum, AK follow up.  Anise Salvo, RMA, am acting as scribe for Willeen Niece, MD .  Documentation: I have reviewed the above documentation for accuracy and completeness, and I agree with the above.  Willeen Niece MD

## 2022-05-01 DIAGNOSIS — F900 Attention-deficit hyperactivity disorder, predominantly inattentive type: Secondary | ICD-10-CM | POA: Diagnosis not present

## 2022-05-01 DIAGNOSIS — F410 Panic disorder [episodic paroxysmal anxiety] without agoraphobia: Secondary | ICD-10-CM | POA: Diagnosis not present

## 2022-07-24 ENCOUNTER — Encounter: Payer: Self-pay | Admitting: Plastic Surgery

## 2022-07-24 ENCOUNTER — Telehealth: Payer: Self-pay

## 2022-07-24 ENCOUNTER — Ambulatory Visit (INDEPENDENT_AMBULATORY_CARE_PROVIDER_SITE_OTHER): Payer: 59 | Admitting: Plastic Surgery

## 2022-07-24 VITALS — BP 120/64 | HR 60 | Ht 61.0 in | Wt 132.6 lb

## 2022-07-24 DIAGNOSIS — Z6825 Body mass index (BMI) 25.0-25.9, adult: Secondary | ICD-10-CM | POA: Diagnosis not present

## 2022-07-24 DIAGNOSIS — M793 Panniculitis, unspecified: Secondary | ICD-10-CM

## 2022-07-24 DIAGNOSIS — R21 Rash and other nonspecific skin eruption: Secondary | ICD-10-CM | POA: Diagnosis not present

## 2022-07-24 NOTE — Telephone Encounter (Signed)
L/M to see if patient has had a mammogram last year. If not, I can place an order.

## 2022-07-24 NOTE — Progress Notes (Signed)
Referring Provider Lauro Regulus, MD [address] [facility] [place],  [place] [address]   CC:  Chief Complaint  Patient presents with   Advice Only      [name] is an 67 y.o. female.  HPI: [name] is a 67 year old female who presents today for evaluation of excess skin on the anterior abdominal wall which she states causes rashes and pain especially in the summer when she sweats.  She has had a 70 pound weight loss which has been accomplished through diet exercise and the use of Ozempic.  She is requesting surgical removal of her anterior abdominal wall pannus  Allergies  Allergen Reactions   Hydroxyzine Other (See Comments)    Excessive sleeping   Metformin Diarrhea   Semaglutide     Gi upset   Trazodone     Makes her feel like she doesn't want to get up the next day     Outpatient Encounter Medications as of 07/24/2022  Medication Sig Note   acetaminophen (TYLENOL) 500 MG tablet Take 500 mg by mouth every 6 (six) hours as needed for moderate pain.    albuterol (PROVENTIL HFA;VENTOLIN HFA) 108 (90 BASE) MCG/ACT inhaler Inhale 2 puffs into the lungs 4 (four) times daily as needed for wheezing or shortness of breath.    ALPRAZolam (XANAX) 1 MG tablet Take 1 mg by mouth See admin instructions. Take 1 mg at night, may take an additional 1 mg dose during the day as needed for anxiety    amphetamine-dextroamphetamine (ADDERALL) 15 MG tablet Take 15 mg by mouth daily at 12 noon. 06/23/2020: Adderall 15 is midday and 30 mg is morning and afternoon   amphetamine-dextroamphetamine (ADDERALL) 30 MG tablet Take 30 mg by mouth 2 (two) times daily. 06/23/2020: Adderall 15 is midday and 30 mg is morning and afternoon   aspirin EC 81 MG tablet Take 81 mg by mouth daily. Swallow whole.    Dexlansoprazole 30 MG capsule DR Take 1 capsule by mouth daily.    diclofenac Sodium (VOLTAREN) 1 % GEL Apply 1 application topically 4 (four) times daily as needed (foot  pain).    esomeprazole (NEXIUM) 40 MG capsule Take 40 mg by mouth 2 (two) times daily.    fluticasone (FLONASE) 50 MCG/ACT nasal spray Place 2 sprays into both nostrils 2 (two) times daily as needed for allergies or rhinitis.    Fluticasone-Salmeterol (ADVAIR) 500-50 MCG/DOSE AEPB Inhale 1 puff into the lungs 2 (two) times daily as needed for shortness of breath.    gabapentin (NEURONTIN) 100 MG capsule Take 100-200 mg by mouth See admin instructions. Take 100 mg in the morning and 200 mg at night    ibuprofen (ADVIL,MOTRIN) 800 MG tablet Take 800 mg by mouth 2 (two) times daily as needed for moderate pain.    JARDIANCE 25 MG TABS tablet Take 25 mg by mouth daily.    Lidocaine (ZTLIDO) 1.8 % PTCH Apply 1 patch topically daily.    lidocaine-prilocaine (EMLA) cream Apply 1 application topically 4 (four) times daily as needed (pain).    lisinopril (PRINIVIL,ZESTRIL) 5 MG tablet Take 5 mg by mouth every morning. DOES NOT HAVE HTN-TAKES FOR KIDNEY PROTECTION DUE TO DM    Oxycodone HCl 10 MG TABS Take 10 mg by mouth every 6 (six) hours as needed for pain.    OZEMPIC, 2 MG/DOSE, 8 MG/3ML SOPN Inject into the skin.    pioglitazone (ACTOS) 45 MG tablet Take 45  mg by mouth at bedtime.    Polyethylene Glycol 400 (VISINE DRY EYE RELIEF OP) Place 1 drop into both eyes daily as needed (dryness).    PRESCRIPTION MEDICATION Apply 1 Pump topically at bedtime. DICLOFENAC 3%, CYCLOBENZAPRINE 2%, GABAPENTIN 2%    rosuvastatin (CRESTOR) 20 MG tablet Take 20 mg by mouth every morning.    tiotropium (SPIRIVA) 18 MCG inhalation capsule Place 18 mcg into inhaler and inhale as needed.    tiZANidine (ZANAFLEX) 4 MG tablet Take 4 mg by mouth at bedtime.    tretinoin (RETIN-A) 0.05 % cream Apply topically at bedtime.    Vilazodone HCl (VIIBRYD) 10 MG TABS Take 10 mg by mouth every morning.    [DISCONTINUED] glipiZIDE (GLUCOTROL XL) 10 MG 24 hr tablet Take 10 mg by mouth 2 (two) times daily. (Patient not taking: Reported on  11/13/2021)    No facility-administered encounter medications on file as of 07/24/2022.     Past Medical History:  Diagnosis Date   Aortic atherosclerosis    Back pain    COPD (chronic obstructive pulmonary disease)    DDD (degenerative disc disease), lumbar    Diabetes mellitus without complication    Hypercholesteremia     Past Surgical History:  Procedure Laterality Date   ABDOMINAL HYSTERECTOMY     BREAST BIOPSY Left    benign   CHOLECYSTECTOMY     SHOULDER ARTHROSCOPY Right 07/06/2020   Procedure: CLOSED MANIPULATION UNDER ANESTHESIA OF RIGHT SHOULDER WITH STEROID INJECTION VERSUS ARTHROSCOPIC DEBRIDEMENT.;  Surgeon: Christena Flake, MD;  Location: [facility] ORS;  Service: Orthopedics;  Laterality: Right;    No family history on file.  Social History   Social History Narrative   Not on file     Review of Systems General: Denies fevers, chills, weight loss CV: Denies chest pain, shortness of breath, palpitations Pannus: Patient has a very large for her body size pannus which hangs well past her symphysis pubis.  She states that she has rashes on the inferior aspect and in the intertriginous regions.  Physical Exam    07/24/2022    9:15 AM 07/06/2020    1:38 PM 07/06/2020   12:30 PM  Vitals with BMI  Height     Weight 132 lbs 10 oz    BMI 25.07    Systolic 120 94 97  Diastolic 64 63 45  Pulse 60 63 63    General:  No acute distress,  Alert and oriented, Non-Toxic, Normal speech and affect Abdomen: Patient has a relatively large pannus which is noted above and below the symphysis pubis.  There is evidence of ongoing rashes today. Mammogram: I cannot find a recent mammogram for this patient.  Will contact her and offer to order one if she has not had one in the past year. 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  Shewouldbeagoodcandidateforupperabdominal but I think that the benefits would be minimal as she already has a  very nice relatively flat upper abdomen.  We discussed panniculectomy at length including the location of the incisions.  We discussed the use of drains and compression postoperatively which she understands will be quite important for her healing.  We discussed the postoperative limitations including no heavy lifting greater than 20 pounds, no vigorous activity, and no submerging incisions in water. The patient's last hemoglobin A1c on March 2024 was 7.9.  I have told her that this is too high to proceed with surgery and have asked that she contact her  primary care physician to discuss this and work on lowering it closer to 6.  She will not be scheduled for surgery until her hemoglobin A1c is under 7.  Santiago Glad 07/24/2022, 10:49 AM

## 2022-07-29 ENCOUNTER — Other Ambulatory Visit: Payer: Self-pay | Admitting: Internal Medicine

## 2022-07-29 DIAGNOSIS — Z1231 Encounter for screening mammogram for malignant neoplasm of breast: Secondary | ICD-10-CM

## 2022-07-29 NOTE — Telephone Encounter (Signed)
Pt is scheduled to have her mammogram tomorrow.

## 2022-07-30 ENCOUNTER — Ambulatory Visit
Admission: RE | Admit: 2022-07-30 | Discharge: 2022-07-30 | Disposition: A | Discharge status: Home / Self Care | Payer: 59 | Source: Ambulatory Visit | Attending: Internal Medicine | Admitting: Internal Medicine

## 2022-07-30 DIAGNOSIS — Z1231 Encounter for screening mammogram for malignant neoplasm of breast: Secondary | ICD-10-CM

## 2022-07-30 NOTE — Telephone Encounter (Signed)
Thank you :)

## 2022-08-23 ENCOUNTER — Telehealth: Payer: Self-pay

## 2022-08-23 NOTE — Telephone Encounter (Signed)
Uploaded CPT Code [phone] Tracking#[id number]. Patient notified by MyChart.

## 2022-09-05 ENCOUNTER — Telehealth: Payer: Self-pay

## 2022-09-05 NOTE — Telephone Encounter (Signed)
CPT code approved authorization# [id number] eff 08/27/2022-11/25/2022. Patient notified via MyChart. Gave file to [facility] to schedule surgery.

## 2022-09-12 ENCOUNTER — Telehealth: Payer: Self-pay | Admitting: *Deleted

## 2022-09-12 ENCOUNTER — Telehealth: Payer: Self-pay

## 2022-09-12 NOTE — Telephone Encounter (Signed)
Patient is wanting to speak to [facility] regarding her A1C appointment, Pain Management Appt, and an Appt for [place].  Advised patient Herbert Seta left for the day and will reach out to her sometime tomorrow.

## 2022-09-12 NOTE — Telephone Encounter (Signed)
Spoke to patient, notified that she needs new A1c labs ordered by PCP as her A1c must be under <7 prior to scheduling her approved panniculectomy surgery. Patient voiced understanding

## 2022-09-19 ENCOUNTER — Telehealth: Payer: Self-pay | Admitting: *Deleted

## 2022-09-19 NOTE — Telephone Encounter (Signed)
LVM for updated A1C

## 2022-09-27 ENCOUNTER — Telehealth: Payer: Self-pay | Admitting: Plastic Surgery

## 2022-09-27 NOTE — Telephone Encounter (Signed)
Pt called and stated she went and had her A1C checked and wants to know if the provider has seen it and what her next steps were in moving forward with sx

## 2022-09-30 ENCOUNTER — Telehealth: Payer: Self-pay | Admitting: *Deleted

## 2022-09-30 NOTE — Telephone Encounter (Signed)
LVM returning patients call about A1C.  Spoke to Dr.Taylor who states she can schedule follow up with PA once A1c is 7.0. No surgery until it is down to 6.5

## 2022-09-30 NOTE — Telephone Encounter (Signed)
Heather, my note was unclear. She could have a ( PA) follow up once it was under 7. Surgery will be 6.5 or less.  Thanks, J

## 2022-10-08 ENCOUNTER — Encounter: Payer: Self-pay | Admitting: Dermatology

## 2022-10-08 ENCOUNTER — Ambulatory Visit (INDEPENDENT_AMBULATORY_CARE_PROVIDER_SITE_OTHER): Payer: 59 | Admitting: Dermatology

## 2022-10-08 DIAGNOSIS — W908XXA Exposure to other nonionizing radiation, initial encounter: Secondary | ICD-10-CM

## 2022-10-08 DIAGNOSIS — L578 Other skin changes due to chronic exposure to nonionizing radiation: Secondary | ICD-10-CM | POA: Diagnosis not present

## 2022-10-08 DIAGNOSIS — Z872 Personal history of diseases of the skin and subcutaneous tissue: Secondary | ICD-10-CM

## 2022-10-08 DIAGNOSIS — L570 Actinic keratosis: Secondary | ICD-10-CM | POA: Diagnosis not present

## 2022-10-08 DIAGNOSIS — L988 Other specified disorders of the skin and subcutaneous tissue: Secondary | ICD-10-CM | POA: Diagnosis not present

## 2022-10-08 DIAGNOSIS — Z7189 Other specified counseling: Secondary | ICD-10-CM

## 2022-10-08 DIAGNOSIS — Z5111 Encounter for antineoplastic chemotherapy: Secondary | ICD-10-CM

## 2022-10-08 MED ORDER — TRETINOIN 0.1 % EX CREA: TOPICAL_CREAM | Freq: Every day | CUTANEOUS | 2 refills | Status: DC

## 2022-10-08 NOTE — Progress Notes (Signed)
Follow-Up Visit   Subjective  [name] is a 67 y.o. female who presents for the following: AK follow up and recheck right nasal dorsum.  Patient would also like to increase tretinoin strength, currently using 0.05%.   The following portions of the chart were reviewed this encounter and updated as appropriate: medications, allergies, medical history  Review of Systems:  No other skin or systemic complaints except as noted in HPI or Assessment and Plan.  Objective  Well appearing patient in no apparent distress; mood and affect are within normal limits.   A focused examination was performed of the following areas: Face, arms, hands  Relevant exam findings are noted in the Assessment and Plan.  L forearm x 1, R wrist x 1, R nasal dorsum x 3, L nasal dorsum x 1, R upper lip x 2 (8) Erythematous thin papules/macules with gritty scale.   face Rhytides and volume loss.     Assessment & Plan     AK (actinic keratosis) (8) L forearm x 1, R wrist x 1, R nasal dorsum x 3, L nasal dorsum x 1, R upper lip x 2  Actinic keratoses are precancerous spots that appear secondary to cumulative UV radiation exposure/sun exposure over time. They are chronic with expected duration over 1 year. A portion of actinic keratoses will progress to squamous cell carcinoma of the skin. It is not possible to reliably predict which spots will progress to skin cancer and so treatment is recommended to prevent development of skin cancer.  Recommend daily broad spectrum sunscreen SPF 30+ to sun-exposed areas, reapply every 2 hours as needed.  Recommend staying in the shade or wearing long sleeves, sun glasses (UVA+UVB protection) and wide brim hats (4-inch brim around the entire circumference of the hat). Call for new or changing lesions.  Residual at nose  Destruction of lesion - L forearm x 1, R wrist x 1, R nasal dorsum x 3, L nasal dorsum x 1, R upper lip x 2  Destruction method: cryotherapy    Informed consent: discussed and consent obtained   Lesion destroyed using liquid nitrogen: Yes   Region frozen until ice ball extended beyond lesion: Yes   Outcome: patient tolerated procedure well with no complications   Post-procedure details: wound care instructions given   Additional details:  Prior to procedure, discussed risks of blister formation, small wound, skin dyspigmentation, or rare scar following cryotherapy. Recommend Vaseline ointment to treated areas while healing.   Elastosis of skin face  Increase tretinoin to 0.1 % cream at bedtime as tolerated  Topical retinoid medications like tretinoin/Retin-A, adapalene/Differin, tazarotene/Fabior, and Epiduo/Epiduo Forte can cause dryness and irritation when first started. Only apply a pea-sized amount to the entire affected area. Avoid applying it around the eyes, edges of mouth and creases at the nose. If you experience irritation, use a good moisturizer first and/or apply the medicine less often. If you are doing well with the medicine, you can increase how often you use it until you are applying every night. Be careful with sun protection while using this medication as it can make you sensitive to the sun. This medicine should not be used by pregnant women.    Related Medications tretinoin (RETIN-A) 0.05 % cream Apply topically at bedtime.   ACTINIC DAMAGE WITH PRECANCEROUS ACTINIC KERATOSES Counseling for Topical Chemotherapy Management: Patient exhibits: - Severe, confluent actinic changes with pre-cancerous actinic keratoses that is secondary to cumulative UV radiation exposure over time - Condition that  is severe; chronic, not at goal. - diffuse scaly erythematous macules and papules with underlying dyspigmentation - Discussed Prescription "Field Treatment" topical Chemotherapy for Severe, Chronic Confluent Actinic Changes with Pre-Cancerous Actinic Keratoses Field treatment involves treatment of an entire area of skin  that has confluent Actinic Changes (Sun/ Ultraviolet light damage) and PreCancerous Actinic Keratoses by method of PhotoDynamic Therapy (PDT) and/or prescription Topical Chemotherapy agents such as 5-fluorouracil, 5-fluorouracil/calcipotriene, and/or imiquimod.  The purpose is to decrease the number of clinically evident and subclinical PreCancerous lesions to prevent progression to development of skin cancer by chemically destroying early precancer changes that may or may not be visible.  It has been shown to reduce the risk of developing skin cancer in the treated area. As a result of treatment, redness, scaling, crusting, and open sores may occur during treatment course. One or more than one of these methods may be used and may have to be used several times to control, suppress and eliminate the PreCancerous changes. Discussed treatment course, expected reaction, and possible side effects. - Recommend daily broad spectrum sunscreen SPF 30+ to sun-exposed areas, reapply every 2 hours as needed.  - Staying in the shade or wearing long sleeves, sun glasses (UVA+UVB protection) and wide brim hats (4-inch brim around the entire circumference of the hat) are also recommended. - Call for new or changing lesions.  - Start 5-fluorouracil/calcipotriene cream twice a day for 5 - 7 days to affected areas including nose, upper lip. Prescription sent to [facility]. Patient advised they will receive an email to purchase the medication online and have it sent to their home. Patient provided with handout reviewing treatment course and side effects and advised to call or message [place] on MyChart with any concerns.  Reviewed course of treatment and expected reaction.  Patient advised to expect inflammation and crusting and advised that erosions are possible.  Patient advised to be diligent with sun protection during and after treatment. Counseled to keep medication out of reach of children and  pets.   HISTORY OF PRECANCEROUS ACTINIC KERATOSIS - site(s) of PreCancerous Actinic Keratosis clear today. - these may recur and new lesions may form requiring treatment to prevent transformation into skin cancer - observe for new or changing spots and contact [facility] for appointment if occur - photoprotection with sun protective clothing; sunglasses and broad spectrum sunscreen with SPF of at least 30 + and frequent self skin exams recommended - yearly exams by a dermatologist recommended for persons with history of PreCancerous Actinic Keratoses   Return in about 6 months (around 04/10/2023) for AK follow up.  Anise Salvo, RMA, am acting as scribe for Willeen Niece, MD .   Documentation: I have reviewed the above documentation for accuracy and completeness, and I agree with the above.  Willeen Niece, MD

## 2022-10-08 NOTE — Patient Instructions (Addendum)
Cryotherapy Aftercare  Wash gently with soap and water everyday.   Apply Vaseline and Band-Aid daily until healed.   Increase tretinoin to 0.1 % cream at bedtime as tolerated.  Can use "sandwich method" with moisturizer - apply moisturizer, then medication, then moisturizer.  Topical retinoid medications like tretinoin/Retin-A, adapalene/Differin, tazarotene/Fabior, and Epiduo/Epiduo Forte can cause dryness and irritation when first started. Only apply a pea-sized amount to the entire affected area. Avoid applying it around the eyes, edges of mouth and creases at the nose. If you experience irritation, use a good moisturizer first and/or apply the medicine less often. If you are doing well with the medicine, you can increase how often you use it until you are applying every night. Be careful with sun protection while using this medication as it can make you sensitive to the sun. This medicine should not be used by pregnant women.   Basic OTC daily skin care regimen to prevent photoaging:   Recommend CeraVe or The Ordinary eye cream.   Recommend facial moisturizer with sunscreen SPF 30 every morning (OTC brands include CeraVe AM, Neutrogena, Eucerin, Cetaphil, Aveeno, La Roche Posay).  Can also apply a topical Vit C serum which is an antioxidant (OTC brands include CeraVe, La Roche Posay, and The Ordinary) underneath sunscreen in morning. If you are outside during the day in the summer for extended periods, especially swimming and/or sweating, make sure you apply a water resistant facial sunscreen lotion spf 30 or higher.   At night recommend a cream with retinol (a vitamin A derivative which stimulates collagen production) like CeraVe skin renewing retinol serum or ROC retinol correxion cream or Neutrogena rapid wrinkle repair cream. Retinol may cause skin irritation in people with sensitive skin.  Can use it every other day and/or apply on top of a hyaluronic acid (HA) moisturizer/serum (Neutrogena  Hydroboost water cream) if better tolerated that way.  Retinol may also help with lightening brown spots.   Our office sells high quality, medically tested skin care lines such as Elta MD sunscreens (with Zinc), and Alastin skin care products, which are very effective in treating photoaging. The Alastin line includes cosmeceutical grade Vit.C serum, HA serum, Elastin stimulating moisturizers/serums, lightening serum, and sunscreens.  If you want prescription treatment, then you would need an appointment (Rx tretinoin and fade creams, Botox, filler injections, laser treatments, etc.) These prescriptions and procedures are not covered by insurance but work very well.  - Start 5-fluorouracil cream twice a day for 5-7 days to affected areas including nose, upper lip.  Reviewed course of treatment and expected reaction.  Patient advised to expect inflammation and crusting and advised that erosions are possible.  Patient advised to be diligent with sun protection during and after treatment. Handout with details of how to apply medication and what to expect provided. Counseled to keep medication out of reach of children and pets.  Instructions for [facility] Medications  One or more of your medications was sent to the [facility] mail order compounding pharmacy. You will receive an email from them and can purchase the medicine through that link. It will then be mailed to your home at the address you confirmed. If for any reason you do not receive an email from them, please check your spam folder. If you still do not find the email, please let us know. [facility] phone number is [phone].  Due to recent changes in healthcare laws, you may see results of your pathology and/or laboratory studies on MyChart  before the doctors have had a chance to review them. We understand that in some cases there may be results that are confusing or concerning to you. Please understand that not all results are  received at the same time and often the doctors may need to interpret multiple results in order to provide you with the best plan of care or course of treatment. Therefore, we ask that you please give [place] 2 business days to thoroughly review all your results before contacting the office for clarification. Should we see a critical lab result, you will be contacted sooner.   If You Need Anything After Your Visit  If you have any questions or concerns for your doctor, please call our main line at [phone] and press option 4 to reach your doctor's medical assistant. If no one answers, please leave a voicemail as directed and we will return your call as soon as possible. Messages left after 4 pm will be answered the following business day.   You may also send [place] a message via MyChart. We typically respond to MyChart messages within 1-2 business days.  For prescription refills, please ask your pharmacy to contact our office. Our fax number is [phone].  If you have an urgent issue when the clinic is closed that cannot wait until the next business day, you can page your doctor at the number below.    Please note that while we do our best to be available for urgent issues outside of office hours, we are not available 24/7.   If you have an urgent issue and are unable to reach [place], you may choose to seek medical care at your doctor's office, retail clinic, urgent care center, or emergency room.  If you have a medical emergency, please immediately call 911 or go to the emergency department.  Pager Numbers  - Dr. Gwen Pounds: [phone]  - Dr. Neale Burly: [phone]  - Dr. Roseanne Reno: [phone]  In the event of inclement weather, please call our main line at [phone] for an update on the status of any delays or closures.  Dermatology Medication Tips: Please keep the boxes that topical medications come in in order to help keep track of the instructions about where and how to use these.  Pharmacies typically print the medication instructions only on the boxes and not directly on the medication tubes.   If your medication is too expensive, please contact our office at [phone] option 4 or send [place] a message through MyChart.   We are unable to tell what your co-pay for medications will be in advance as this is different depending on your insurance coverage. However, we may be able to find a substitute medication at lower cost or fill out paperwork to get insurance to cover a needed medication.   If a prior authorization is required to get your medication covered by your insurance company, please allow [place] 1-2 business days to complete this process.  Drug prices often vary depending on where the prescription is filled and some pharmacies may offer cheaper prices.  The website www.goodrx.com contains coupons for medications through different pharmacies. The prices here do not account for what the cost may be with help from insurance (it may be cheaper with your insurance), but the website can give you the price if you did not use any insurance.  - You can print the associated coupon and take it with your prescription to the pharmacy.  - You may also stop by our office during regular business hours  and pick up a GoodRx coupon card.  - If you need your prescription sent electronically to a different pharmacy, notify our office through [facility] or by phone at [phone] option 4.

## 2022-12-24 NOTE — Telephone Encounter (Signed)
Patient called back and said her A1C is now below 7. Based on message below I scheduled patient with a PA

## 2023-01-03 ENCOUNTER — Encounter: Payer: Self-pay | Admitting: Student

## 2023-01-03 ENCOUNTER — Ambulatory Visit (INDEPENDENT_AMBULATORY_CARE_PROVIDER_SITE_OTHER): Payer: 59 | Admitting: Student

## 2023-01-03 VITALS — BP 117/52 | HR 62 | Ht 61.0 in | Wt 121.4 lb

## 2023-01-03 DIAGNOSIS — M793 Panniculitis, unspecified: Secondary | ICD-10-CM

## 2023-01-03 NOTE — Progress Notes (Signed)
Referring Provider Lauro Regulus, MD [address] [facility] [place] [facility] [place],  [place] [address]   CC:  Chief Complaint  Patient presents with   Follow-up      [name] is an 67 y.o. female.  HPI: Patient is a 67 year old female who was seen by Dr. Ladona Ridgel initially for consult on 07/24/2022.  At this visit, patient presented for evaluation of excess skin on the anterior abdominal wall.  Patient reported that this area caused rashes and pain especially in the summer when she sweats.  Patient stated that she had a 70 pound weight loss which had been accomplished through diet, exercise and the use of exam back.  Patient was requesting surgical removal of her anterior abdominal wall pannus.  Patient was found to be an excellent candidate for panniculectomy.  Patient would also be a good candidate for upper abdominal but it was discussed with the patient that the benefits would most likely be minimal.  The patient's last hemoglobin A1c was back in March 2024 which was 7.9.  It was discussed with the patient that that was too high to proceed and she would have to work on lowering her hemoglobin A1c closer to 6.  It was discussed with the patient that she would not be scheduled until A1c was under 7.  Per chart review, patient's most recent hemoglobin A1c was 2 weeks ago which was 6.9.  Today, patient reports she is doing well.  She states that her hemoglobin A1c is now below 7 and she would like to move forward with scheduling surgery for panniculectomy.  She states that this has already been approved by her insurance.  Patient states that she is still having rashes underneath her pannus.  She denies any recent fevers chills or changes in her health.  Patient also mentioned that she would like to have the wrinkles to her face possibly addressed at some point.  Review of Systems General: Denies any recent fevers, chills or changes in her health  Physical Exam    07/24/2022     9:15 AM 07/06/2020    1:38 PM 07/06/2020   12:30 PM  Vitals with BMI  Height 5\' 1"     Weight 132 lbs 10 oz    BMI 25.07    Systolic 120 94 97  Diastolic 64 63 45  Pulse 60 63 63    General:  No acute distress,  Alert and oriented, Non-Toxic, Normal speech and affect Psych: Normal mood/normal behavior Respiratory: Unlabored breathing, no respiratory distress MSK: Ambulatory  Assessment/Plan  Panniculitis - Plan: Ambulatory Referral For Surgery Scheduling   Discussed with patient that given her hemoglobin A1c is not below 7, we will go ahead and schedule for surgery.  Discussed with her to continue to work on lowering her A1c.  Patient expressed understanding.  Discussed with patient the differences between panniculectomy with upper abdominal add-on and just a panniculectomy.  Patient states she wants to move forward with just a panniculectomy.  Also discussed with patient that her wrinkles should be addressed after she is completely recovered from her upcoming surgery.  I did discuss some options for her including halo laser and Moxi laser.  I told her that she can think about these and let us know if this is something she would be interested in at a later time.  Patient expressed understanding.  Instructed patient to call back if she has any questions or concerns about anything.  [name]  01/03/2023, 10:11 AM

## 2023-01-14 ENCOUNTER — Other Ambulatory Visit: Payer: Self-pay

## 2023-01-14 MED ORDER — TRETINOIN 0.1 % EX CREA: TOPICAL_CREAM | Freq: Every day | CUTANEOUS | 2 refills | Status: DC

## 2023-02-03 ENCOUNTER — Encounter: Payer: Self-pay | Admitting: Student

## 2023-02-03 ENCOUNTER — Ambulatory Visit (INDEPENDENT_AMBULATORY_CARE_PROVIDER_SITE_OTHER): Payer: 59 | Admitting: Student

## 2023-02-03 VITALS — BP 136/65 | HR 72 | Ht 61.0 in | Wt 115.6 lb

## 2023-02-03 DIAGNOSIS — M793 Panniculitis, unspecified: Secondary | ICD-10-CM | POA: Diagnosis not present

## 2023-02-03 MED ORDER — ONDANSETRON HCL 4 MG PO TABS: 4.0000 mg | ORAL_TABLET | Freq: Three times a day (TID) | ORAL | 0 refills | Status: AC | PRN

## 2023-02-03 NOTE — H&P (View-Only) (Signed)
 Patient ID: [name], female    DOB: Aug 13, 1955, 67 y.o.   MRN: [id number]  Chief Complaint  Patient presents with   Pre-op Exam      ICD-10-CM   1. Panniculitis  M79.3        History of Present Illness: [name] is a 67 y.o.  female  with a history of panniculitis.  She presents for preoperative evaluation for upcoming procedure, panniculectomy, scheduled for 02/18/2023 with Dr. Ladona Ridgel.  Patient presents with her cousin at bedside.  The patient has not had problems with anesthesia.  Patient denies any history of cardiac disease.  She denies seeing a cardiologist for anything.  Patient does reports she is on aspirin 81 mg, just for preventative reasons.  Patient reports she is not a smoker.  Patient denies taking any birth control or hormone replacement.  She denies any history of miscarriages.  She denies any personal history of blood clots.  She thinks her grandfather may have had a blood clot.  She denies any personal or family history of clotting diseases.  She denies any recent surgeries, traumas, infections.  She denies any history of stroke or heart attack.  She denies any history of Crohn's disease or ulcerative colitis.  She does reports she has COPD, but reports it is controlled and she has never had issues with it.  She denies any history of cancer.  Patient does states she has scattered varicosities to her lower extremities.  She denies any fevers or chills or recent changes in her health.  Summary of Previous Visit: Patient was seen for initial consult by Dr. Ladona Ridgel on 07/24/2022. At this visit, patient presented for evaluation of excess skin on the anterior abdominal wall. Patient reported that this area caused rashes and pain especially in the summer when she sweats. Patient stated that she had a 70 pound weight loss which had been accomplished through diet, exercise and the use of exam back. Patient was requesting surgical removal of her anterior abdominal wall  pannus. Patient was found to be an excellent candidate for panniculectomy. Patient would also be a good candidate for upper abdominal but it was discussed with the patient that the benefits would most likely be minimal. The patient's last hemoglobin A1c was back in March 2024 which was 7.9. It was discussed with the patient that that was too high to proceed and she would have to work on lowering her hemoglobin A1c closer to 6. It was discussed with the patient that she would not be scheduled until A1c was under 7.   Per chart review, patient's most recent Hgb A1c was on 12/16/22 and it was 6.9.   Job: Does not work at this time.  PMH Significant for: History of pneumonia, COPD, depression, type 2 diabetes, GERD, panniculitis  Patient does states she receives oxycodone from her pain management provider.   Past Medical History: Allergies: Allergies  Allergen Reactions   Hydroxyzine Other (See Comments)    Excessive sleeping   Metformin Diarrhea   Semaglutide     Gi upset   Trazodone     Makes her feel like she doesn't want to get up the next day     Current Medications:  Current Outpatient Medications:    acetaminophen (TYLENOL) 500 MG tablet, Take 500 mg by mouth every 6 (six) hours as needed for moderate pain., Disp: , Rfl:    albuterol (PROVENTIL HFA;VENTOLIN HFA) 108 (90 BASE) MCG/ACT inhaler, Inhale 2 puffs into  the lungs 4 (four) times daily as needed for wheezing or shortness of breath., Disp: , Rfl:    ALPRAZolam (XANAX) 1 MG tablet, Take 1 mg by mouth See admin instructions. Take 1 mg at night, may take an additional 1 mg dose during the day as needed for anxiety, Disp: , Rfl:    amphetamine-dextroamphetamine (ADDERALL) 15 MG tablet, Take 15 mg by mouth daily at 12 noon., Disp: , Rfl:    amphetamine-dextroamphetamine (ADDERALL) 30 MG tablet, Take 30 mg by mouth 2 (two) times daily., Disp: , Rfl:    aspirin EC 81 MG tablet, Take 81 mg by mouth daily. Swallow whole., Disp: , Rfl:     Dexlansoprazole 30 MG capsule DR, Take 1 capsule by mouth daily., Disp: , Rfl:    diclofenac Sodium (VOLTAREN) 1 % GEL, Apply 1 application topically 4 (four) times daily as needed (foot pain)., Disp: , Rfl:    esomeprazole (NEXIUM) 40 MG capsule, Take 40 mg by mouth 2 (two) times daily., Disp: , Rfl:    fluticasone (FLONASE) 50 MCG/ACT nasal spray, Place 2 sprays into both nostrils 2 (two) times daily as needed for allergies or rhinitis., Disp: , Rfl:    Fluticasone-Salmeterol (ADVAIR) 500-50 MCG/DOSE AEPB, Inhale 1 puff into the lungs 2 (two) times daily as needed for shortness of breath., Disp: , Rfl:    gabapentin (NEURONTIN) 100 MG capsule, Take 100-200 mg by mouth See admin instructions. Take 100 mg in the morning and 200 mg at night, Disp: , Rfl:    ibuprofen (ADVIL,MOTRIN) 800 MG tablet, Take 800 mg by mouth 2 (two) times daily as needed for moderate pain., Disp: , Rfl:    JARDIANCE 25 MG TABS tablet, Take 25 mg by mouth daily., Disp: , Rfl:    Lidocaine (ZTLIDO) 1.8 % PTCH, Apply 1 patch topically daily., Disp: , Rfl:    lidocaine-prilocaine (EMLA) cream, Apply 1 application topically 4 (four) times daily as needed (pain)., Disp: , Rfl:    lisinopril (PRINIVIL,ZESTRIL) 5 MG tablet, Take 5 mg by mouth every morning. DOES NOT HAVE HTN-TAKES FOR KIDNEY PROTECTION DUE TO DM, Disp: , Rfl:    Oxycodone HCl 10 MG TABS, Take 10 mg by mouth every 6 (six) hours as needed for pain., Disp: , Rfl:    OZEMPIC, 2 MG/DOSE, 8 MG/3ML SOPN, Inject into the skin., Disp: , Rfl:    pioglitazone (ACTOS) 45 MG tablet, Take 45 mg by mouth at bedtime., Disp: , Rfl:    Polyethylene Glycol 400 (VISINE DRY EYE RELIEF OP), Place 1 drop into both eyes daily as needed (dryness)., Disp: , Rfl:    PRESCRIPTION MEDICATION, Apply 1 Pump topically at bedtime. DICLOFENAC 3%, CYCLOBENZAPRINE 2%, GABAPENTIN 2%, Disp: , Rfl:    rosuvastatin (CRESTOR) 20 MG tablet, Take 20 mg by mouth every morning., Disp: , Rfl:    tiotropium  (SPIRIVA) 18 MCG inhalation capsule, Place 18 mcg into inhaler and inhale as needed., Disp: , Rfl:    tiZANidine (ZANAFLEX) 4 MG tablet, Take 4 mg by mouth at bedtime., Disp: , Rfl:    tretinoin (RETIN-A) 0.1 % cream, Apply topically at bedtime., Disp: 20 g, Rfl: 2   Vilazodone HCl (VIIBRYD) 10 MG TABS, Take 10 mg by mouth every morning., Disp: , Rfl:   Past Medical Problems: Past Medical History:  Diagnosis Date   Aortic atherosclerosis (HCC)    Back pain    COPD (chronic obstructive pulmonary disease) (HCC)    DDD (degenerative disc disease), lumbar  Diabetes mellitus without complication (HCC)    Hypercholesteremia     Past Surgical History: Past Surgical History:  Procedure Laterality Date   ABDOMINAL HYSTERECTOMY     BREAST BIOPSY Left    benign   CHOLECYSTECTOMY     SHOULDER ARTHROSCOPY Right 07/06/2020   Procedure: CLOSED MANIPULATION UNDER ANESTHESIA OF RIGHT SHOULDER WITH STEROID INJECTION VERSUS ARTHROSCOPIC DEBRIDEMENT.;  Surgeon: Christena Flake, MD;  Location: [facility] ORS;  Service: Orthopedics;  Laterality: Right;    Social History: Social History   Socioeconomic History   Marital status: Widowed    Spouse name: Not on file   Number of children: Not on file   Years of education: Not on file   Highest education level: Not on file  Occupational History   Not on file  Tobacco Use   Smoking status: Former    Current packs/day: 0.00    Average packs/day: 2.0 packs/day for 20.0 years (40.0 ttl pk-yrs)    Types: Cigarettes    Start date: 06/30/1990    Quit date: 06/30/2010    Years since quitting: 12.6   Smokeless tobacco: Never  Vaping Use   Vaping status: Never Used  Substance and Sexual Activity   Alcohol use: Yes    Comment: rare   Drug use: No   Sexual activity: Not on file  Other Topics Concern   Not on file  Social History Narrative   Not on file   Social Determinants of Health   Financial Resource Strain: Low Risk  (12/23/2022)   Received from  [facility]   Overall Financial Resource Strain (CARDIA)    Difficulty of Paying Living Expenses: Not hard at all  Food Insecurity: No Food Insecurity (12/23/2022)   Received from [facility]   Hunger Vital Sign    Worried About Running Out of Food in the Last Year: Never true    Ran Out of Food in the Last Year: Never true  Transportation Needs: No Transportation Needs (12/23/2022)   Received from [facility] - Transportation    In the past 12 months, has lack of transportation kept you from medical appointments or from getting medications?: No    Lack of Transportation (Non-Medical): No  Physical Activity: Not on file  Stress: Not on file  Social Connections: Not on file  Intimate Partner Violence: Not on file    Family History: No family history on file.  Review of Systems: Denies any recent fevers or chills  Physical Exam: Vital Signs BP 136/65 (BP Location: Left Arm, Patient Position: Sitting, Cuff Size: Small)   Pulse 72   Ht 5\' 1"  (1.549 m)   Wt 115 lb 9.6 oz (52.4 kg)   SpO2 98%   BMI 21.84 kg/m   Physical Exam  Constitutional:      General: Not in acute distress.    Appearance: Normal appearance. Not ill-appearing.  HENT:     Head: Normocephalic and atraumatic.  Neck:     Musculoskeletal: Normal range of motion.  Cardiovascular:     Rate and Rhythm: Normal rate Pulmonary:     Effort: Pulmonary effort is normal. No respiratory distress.  Musculoskeletal: Normal range of motion.  Skin:    General: Skin is warm and dry.     Findings: No erythema or rash.  Neurological:     Mental Status: Alert and oriented to person, place, and time. Mental status is at baseline.  Psychiatric:  Mood and Affect: Mood normal.        Behavior: Behavior normal.    Assessment/Plan: The patient is scheduled for panniculectomy with Dr. Ladona Ridgel.  Risks, benefits, and alternatives of procedure discussed,  questions answered and consent obtained.    Smoking Status: Non-smoker; Counseling Given?  N/A  Will send clearance to patient's PCP.  Caprini Score: 8; Risk Factors include: Age, varicosities to the lower extremities, and family history of thrombosis, and length of planned surgery. Recommendation for mechanical and possible pharmacological prophylaxis.  Will discuss the possibility of postoperative Lovenox with Dr. Ladona Ridgel.  Encourage early ambulation.   Pictures obtained: @consult   Post-op Rx sent to pharmacy:  Zofran, discussed with patient that given she is receiving oxycodone from her pain management provider, she may take oxycodone for pain after surgery that she is already been prescribed or she may defer to pain management for further postoperative pain management.  Patient expressed understanding was in agreement with this.  Discussed with patient that she should hold her aspirin 1 week prior to surgery as well as any multivitamins, supplements and ibuprofen.  Also discussed with the patient that she should hold her Adderall the day of surgery, Xanax the day of surgery, lisinopril, oxycodone and Zanaflex in the day of surgery.  Recommended she hold her Ozempic the dose before surgery.  Also instructed her to hold her empagliflozin the day of surgery.  Patient expressed understanding.  Patient was provided with the General Surgical Risk consent document and Pain Medication Agreement prior to their appointment.  They had adequate time to read through the risk consent documents and Pain Medication Agreement. We also discussed them in person together during this preop appointment. All of their questions were answered to their satisfaction.  Recommended calling if they have any further questions.  Risk consent form and Pain Medication Agreement to be scanned into patient's chart.  The consent was obtained with risks and complications reviewed which included bleeding, pain, scar, infection and the  risk of anesthesia.  The patients questions were answered to the patients expressed satisfaction.   Discussed with patient given her history of diabetes and COPD, she may be at increased risk of perioperative complications including wound healing delays in anesthesia.  Patient expressed understanding.   Electronically signed by: [name], PA-C 02/03/2023 10:43 AM

## 2023-02-03 NOTE — Progress Notes (Signed)
Patient ID: [name], female    DOB: Aug 13, 1955, 67 y.o.   MRN: [id number]  Chief Complaint  Patient presents with   Pre-op Exam      ICD-10-CM   1. Panniculitis  M79.3        History of Present Illness: [name] is a 67 y.o.  female  with a history of panniculitis.  She presents for preoperative evaluation for upcoming procedure, panniculectomy, scheduled for 02/18/2023 with Dr. Ladona Ridgel.  Patient presents with her cousin at bedside.  The patient has not had problems with anesthesia.  Patient denies any history of cardiac disease.  She denies seeing a cardiologist for anything.  Patient does reports she is on aspirin 81 mg, just for preventative reasons.  Patient reports she is not a smoker.  Patient denies taking any birth control or hormone replacement.  She denies any history of miscarriages.  She denies any personal history of blood clots.  She thinks her grandfather may have had a blood clot.  She denies any personal or family history of clotting diseases.  She denies any recent surgeries, traumas, infections.  She denies any history of stroke or heart attack.  She denies any history of Crohn's disease or ulcerative colitis.  She does reports she has COPD, but reports it is controlled and she has never had issues with it.  She denies any history of cancer.  Patient does states she has scattered varicosities to her lower extremities.  She denies any fevers or chills or recent changes in her health.  Summary of Previous Visit: Patient was seen for initial consult by Dr. Ladona Ridgel on 07/24/2022. At this visit, patient presented for evaluation of excess skin on the anterior abdominal wall. Patient reported that this area caused rashes and pain especially in the summer when she sweats. Patient stated that she had a 70 pound weight loss which had been accomplished through diet, exercise and the use of exam back. Patient was requesting surgical removal of her anterior abdominal wall  pannus. Patient was found to be an excellent candidate for panniculectomy. Patient would also be a good candidate for upper abdominal but it was discussed with the patient that the benefits would most likely be minimal. The patient's last hemoglobin A1c was back in March 2024 which was 7.9. It was discussed with the patient that that was too high to proceed and she would have to work on lowering her hemoglobin A1c closer to 6. It was discussed with the patient that she would not be scheduled until A1c was under 7.   Per chart review, patient's most recent Hgb A1c was on 12/16/22 and it was 6.9.   Job: Does not work at this time.  PMH Significant for: History of pneumonia, COPD, depression, type 2 diabetes, GERD, panniculitis  Patient does states she receives oxycodone from her pain management provider.   Past Medical History: Allergies: Allergies  Allergen Reactions   Hydroxyzine Other (See Comments)    Excessive sleeping   Metformin Diarrhea   Semaglutide     Gi upset   Trazodone     Makes her feel like she doesn't want to get up the next day     Current Medications:  Current Outpatient Medications:    acetaminophen (TYLENOL) 500 MG tablet, Take 500 mg by mouth every 6 (six) hours as needed for moderate pain., Disp: , Rfl:    albuterol (PROVENTIL HFA;VENTOLIN HFA) 108 (90 BASE) MCG/ACT inhaler, Inhale 2 puffs into  the lungs 4 (four) times daily as needed for wheezing or shortness of breath., Disp: , Rfl:    ALPRAZolam (XANAX) 1 MG tablet, Take 1 mg by mouth See admin instructions. Take 1 mg at night, may take an additional 1 mg dose during the day as needed for anxiety, Disp: , Rfl:    amphetamine-dextroamphetamine (ADDERALL) 15 MG tablet, Take 15 mg by mouth daily at 12 noon., Disp: , Rfl:    amphetamine-dextroamphetamine (ADDERALL) 30 MG tablet, Take 30 mg by mouth 2 (two) times daily., Disp: , Rfl:    aspirin EC 81 MG tablet, Take 81 mg by mouth daily. Swallow whole., Disp: , Rfl:     Dexlansoprazole 30 MG capsule DR, Take 1 capsule by mouth daily., Disp: , Rfl:    diclofenac Sodium (VOLTAREN) 1 % GEL, Apply 1 application topically 4 (four) times daily as needed (foot pain)., Disp: , Rfl:    esomeprazole (NEXIUM) 40 MG capsule, Take 40 mg by mouth 2 (two) times daily., Disp: , Rfl:    fluticasone (FLONASE) 50 MCG/ACT nasal spray, Place 2 sprays into both nostrils 2 (two) times daily as needed for allergies or rhinitis., Disp: , Rfl:    Fluticasone-Salmeterol (ADVAIR) 500-50 MCG/DOSE AEPB, Inhale 1 puff into the lungs 2 (two) times daily as needed for shortness of breath., Disp: , Rfl:    gabapentin (NEURONTIN) 100 MG capsule, Take 100-200 mg by mouth See admin instructions. Take 100 mg in the morning and 200 mg at night, Disp: , Rfl:    ibuprofen (ADVIL,MOTRIN) 800 MG tablet, Take 800 mg by mouth 2 (two) times daily as needed for moderate pain., Disp: , Rfl:    JARDIANCE 25 MG TABS tablet, Take 25 mg by mouth daily., Disp: , Rfl:    Lidocaine (ZTLIDO) 1.8 % PTCH, Apply 1 patch topically daily., Disp: , Rfl:    lidocaine-prilocaine (EMLA) cream, Apply 1 application topically 4 (four) times daily as needed (pain)., Disp: , Rfl:    lisinopril (PRINIVIL,ZESTRIL) 5 MG tablet, Take 5 mg by mouth every morning. DOES NOT HAVE HTN-TAKES FOR KIDNEY PROTECTION DUE TO DM, Disp: , Rfl:    Oxycodone HCl 10 MG TABS, Take 10 mg by mouth every 6 (six) hours as needed for pain., Disp: , Rfl:    OZEMPIC, 2 MG/DOSE, 8 MG/3ML SOPN, Inject into the skin., Disp: , Rfl:    pioglitazone (ACTOS) 45 MG tablet, Take 45 mg by mouth at bedtime., Disp: , Rfl:    Polyethylene Glycol 400 (VISINE DRY EYE RELIEF OP), Place 1 drop into both eyes daily as needed (dryness)., Disp: , Rfl:    PRESCRIPTION MEDICATION, Apply 1 Pump topically at bedtime. DICLOFENAC 3%, CYCLOBENZAPRINE 2%, GABAPENTIN 2%, Disp: , Rfl:    rosuvastatin (CRESTOR) 20 MG tablet, Take 20 mg by mouth every morning., Disp: , Rfl:    tiotropium  (SPIRIVA) 18 MCG inhalation capsule, Place 18 mcg into inhaler and inhale as needed., Disp: , Rfl:    tiZANidine (ZANAFLEX) 4 MG tablet, Take 4 mg by mouth at bedtime., Disp: , Rfl:    tretinoin (RETIN-A) 0.1 % cream, Apply topically at bedtime., Disp: 20 g, Rfl: 2   Vilazodone HCl (VIIBRYD) 10 MG TABS, Take 10 mg by mouth every morning., Disp: , Rfl:   Past Medical Problems: Past Medical History:  Diagnosis Date   Aortic atherosclerosis (HCC)    Back pain    COPD (chronic obstructive pulmonary disease) (HCC)    DDD (degenerative disc disease), lumbar  Diabetes mellitus without complication (HCC)    Hypercholesteremia     Past Surgical History: Past Surgical History:  Procedure Laterality Date   ABDOMINAL HYSTERECTOMY     BREAST BIOPSY Left    benign   CHOLECYSTECTOMY     SHOULDER ARTHROSCOPY Right 07/06/2020   Procedure: CLOSED MANIPULATION UNDER ANESTHESIA OF RIGHT SHOULDER WITH STEROID INJECTION VERSUS ARTHROSCOPIC DEBRIDEMENT.;  Surgeon: Christena Flake, MD;  Location: [facility] ORS;  Service: Orthopedics;  Laterality: Right;    Social History: Social History   Socioeconomic History   Marital status: Widowed    Spouse name: Not on file   Number of children: Not on file   Years of education: Not on file   Highest education level: Not on file  Occupational History   Not on file  Tobacco Use   Smoking status: Former    Current packs/day: 0.00    Average packs/day: 2.0 packs/day for 20.0 years (40.0 ttl pk-yrs)    Types: Cigarettes    Start date: 06/30/1990    Quit date: 06/30/2010    Years since quitting: 12.6   Smokeless tobacco: Never  Vaping Use   Vaping status: Never Used  Substance and Sexual Activity   Alcohol use: Yes    Comment: rare   Drug use: No   Sexual activity: Not on file  Other Topics Concern   Not on file  Social History Narrative   Not on file   Social Determinants of Health   Financial Resource Strain: Low Risk  (12/23/2022)   Received from  [facility]   Overall Financial Resource Strain (CARDIA)    Difficulty of Paying Living Expenses: Not hard at all  Food Insecurity: No Food Insecurity (12/23/2022)   Received from [facility]   Hunger Vital Sign    Worried About Running Out of Food in the Last Year: Never true    Ran Out of Food in the Last Year: Never true  Transportation Needs: No Transportation Needs (12/23/2022)   Received from [facility] - Transportation    In the past 12 months, has lack of transportation kept you from medical appointments or from getting medications?: No    Lack of Transportation (Non-Medical): No  Physical Activity: Not on file  Stress: Not on file  Social Connections: Not on file  Intimate Partner Violence: Not on file    Family History: No family history on file.  Review of Systems: Denies any recent fevers or chills  Physical Exam: Vital Signs BP 136/65 (BP Location: Left Arm, Patient Position: Sitting, Cuff Size: Small)   Pulse 72   Ht 5\' 1"  (1.549 m)   Wt 115 lb 9.6 oz (52.4 kg)   SpO2 98%   BMI 21.84 kg/m   Physical Exam  Constitutional:      General: Not in acute distress.    Appearance: Normal appearance. Not ill-appearing.  HENT:     Head: Normocephalic and atraumatic.  Neck:     Musculoskeletal: Normal range of motion.  Cardiovascular:     Rate and Rhythm: Normal rate Pulmonary:     Effort: Pulmonary effort is normal. No respiratory distress.  Musculoskeletal: Normal range of motion.  Skin:    General: Skin is warm and dry.     Findings: No erythema or rash.  Neurological:     Mental Status: Alert and oriented to person, place, and time. Mental status is at baseline.  Psychiatric:  Mood and Affect: Mood normal.        Behavior: Behavior normal.    Assessment/Plan: The patient is scheduled for panniculectomy with Dr. Ladona Ridgel.  Risks, benefits, and alternatives of procedure discussed,  questions answered and consent obtained.    Smoking Status: Non-smoker; Counseling Given?  N/A  Will send clearance to patient's PCP.  Caprini Score: 8; Risk Factors include: Age, varicosities to the lower extremities, and family history of thrombosis, and length of planned surgery. Recommendation for mechanical and possible pharmacological prophylaxis.  Will discuss the possibility of postoperative Lovenox with Dr. Ladona Ridgel.  Encourage early ambulation.   Pictures obtained: @consult   Post-op Rx sent to pharmacy:  Zofran, discussed with patient that given she is receiving oxycodone from her pain management provider, she may take oxycodone for pain after surgery that she is already been prescribed or she may defer to pain management for further postoperative pain management.  Patient expressed understanding was in agreement with this.  Discussed with patient that she should hold her aspirin 1 week prior to surgery as well as any multivitamins, supplements and ibuprofen.  Also discussed with the patient that she should hold her Adderall the day of surgery, Xanax the day of surgery, lisinopril, oxycodone and Zanaflex in the day of surgery.  Recommended she hold her Ozempic the dose before surgery.  Also instructed her to hold her empagliflozin the day of surgery.  Patient expressed understanding.  Patient was provided with the General Surgical Risk consent document and Pain Medication Agreement prior to their appointment.  They had adequate time to read through the risk consent documents and Pain Medication Agreement. We also discussed them in person together during this preop appointment. All of their questions were answered to their satisfaction.  Recommended calling if they have any further questions.  Risk consent form and Pain Medication Agreement to be scanned into patient's chart.  The consent was obtained with risks and complications reviewed which included bleeding, pain, scar, infection and the  risk of anesthesia.  The patients questions were answered to the patients expressed satisfaction.   Discussed with patient given her history of diabetes and COPD, she may be at increased risk of perioperative complications including wound healing delays in anesthesia.  Patient expressed understanding.   Electronically signed by: [name], PA-C 02/03/2023 10:43 AM

## 2023-02-10 ENCOUNTER — Encounter (HOSPITAL_BASED_OUTPATIENT_CLINIC_OR_DEPARTMENT_OTHER): Payer: Self-pay | Admitting: Plastic Surgery

## 2023-02-10 ENCOUNTER — Other Ambulatory Visit: Payer: Self-pay

## 2023-02-11 ENCOUNTER — Encounter (HOSPITAL_BASED_OUTPATIENT_CLINIC_OR_DEPARTMENT_OTHER)
Admission: RE | Admit: 2023-02-11 | Discharge: 2023-02-11 | Disposition: A | Discharge status: Home / Self Care | Payer: 59 | Source: Ambulatory Visit | Attending: Plastic Surgery | Admitting: Plastic Surgery

## 2023-02-11 DIAGNOSIS — Z01810 Encounter for preprocedural cardiovascular examination: Secondary | ICD-10-CM | POA: Diagnosis present

## 2023-02-11 LAB — BASIC METABOLIC PANEL
Anion gap: 8 (ref 5–15)
BUN: 11 mg/dL (ref 8–23)
CO2: 29 mmol/L (ref 22–32)
Calcium: 9.3 mg/dL (ref 8.9–10.3)
Chloride: 101 mmol/L (ref 98–111)
Creatinine, Ser: 0.76 mg/dL (ref 0.44–1.00)
GFR, Estimated: >60 mL/min (ref >60)
Glucose, Bld: 119 mg/dL — ABNORMAL HIGH (ref 70–99)
Potassium: 4.2 mmol/L (ref 3.5–5.1)
Sodium: 138 mmol/L (ref 135–145)

## 2023-02-11 MED ORDER — CHLORHEXIDINE GLUCONATE CLOTH 2 % EX PADS: 6.0000 | MEDICATED_PAD | Freq: Once | CUTANEOUS | Status: DC

## 2023-02-11 NOTE — Progress Notes (Signed)

## 2023-02-14 ENCOUNTER — Encounter: Payer: Self-pay | Admitting: Student

## 2023-02-14 NOTE — Progress Notes (Signed)
Surgical Clearance has been received from Dr. Einar Crow for patient's upcoming surgery with Dr. Ladona Ridgel .  Per chart review, patient was seen by Dr. Einar Crow on 02/10/23. Per note from the visit: "Mild increase risk with copd but exercise capacity if reasonable so she should be able to tolerate her low risk panniculectomy without further preop testing needed given risk of frequent infections if not done"

## 2023-02-18 ENCOUNTER — Other Ambulatory Visit: Payer: Self-pay

## 2023-02-18 ENCOUNTER — Ambulatory Visit (HOSPITAL_BASED_OUTPATIENT_CLINIC_OR_DEPARTMENT_OTHER)
Admission: RE | Admit: 2023-02-18 | Discharge: 2023-02-18 | Disposition: A | Discharge status: Home / Self Care | Payer: 59 | Attending: Plastic Surgery | Admitting: Plastic Surgery

## 2023-02-18 ENCOUNTER — Encounter (HOSPITAL_BASED_OUTPATIENT_CLINIC_OR_DEPARTMENT_OTHER): Payer: Self-pay | Admitting: Plastic Surgery

## 2023-02-18 ENCOUNTER — Ambulatory Visit (HOSPITAL_BASED_OUTPATIENT_CLINIC_OR_DEPARTMENT_OTHER): Payer: 59 | Admitting: Anesthesiology

## 2023-02-18 ENCOUNTER — Encounter (HOSPITAL_BASED_OUTPATIENT_CLINIC_OR_DEPARTMENT_OTHER)
Admission: RE | Disposition: A | Discharge status: Home / Self Care | Payer: Self-pay | Source: Home / Self Care | Attending: Plastic Surgery

## 2023-02-18 DIAGNOSIS — Z7985 Long-term (current) use of injectable non-insulin antidiabetic drugs: Secondary | ICD-10-CM | POA: Insufficient documentation

## 2023-02-18 DIAGNOSIS — E119 Type 2 diabetes mellitus without complications: Secondary | ICD-10-CM | POA: Diagnosis not present

## 2023-02-18 DIAGNOSIS — Z7982 Long term (current) use of aspirin: Secondary | ICD-10-CM | POA: Insufficient documentation

## 2023-02-18 DIAGNOSIS — Z87891 Personal history of nicotine dependence: Secondary | ICD-10-CM | POA: Insufficient documentation

## 2023-02-18 DIAGNOSIS — J449 Chronic obstructive pulmonary disease, unspecified: Secondary | ICD-10-CM | POA: Diagnosis not present

## 2023-02-18 DIAGNOSIS — Z7984 Long term (current) use of oral hypoglycemic drugs: Secondary | ICD-10-CM | POA: Diagnosis not present

## 2023-02-18 DIAGNOSIS — K219 Gastro-esophageal reflux disease without esophagitis: Secondary | ICD-10-CM | POA: Insufficient documentation

## 2023-02-18 DIAGNOSIS — F418 Other specified anxiety disorders: Secondary | ICD-10-CM | POA: Insufficient documentation

## 2023-02-18 DIAGNOSIS — Z01818 Encounter for other preprocedural examination: Secondary | ICD-10-CM

## 2023-02-18 DIAGNOSIS — M793 Panniculitis, unspecified: Secondary | ICD-10-CM | POA: Insufficient documentation

## 2023-02-18 HISTORY — PX: PANNICULECTOMY: SHX5360

## 2023-02-18 HISTORY — DX: Depression, unspecified: F32.A

## 2023-02-18 HISTORY — DX: Anxiety disorder, unspecified: F41.9

## 2023-02-18 LAB — GLUCOSE, CAPILLARY
Glucose-Capillary: 122 mg/dL — ABNORMAL HIGH (ref 70–99)
Glucose-Capillary: 122 mg/dL — ABNORMAL HIGH (ref 70–99)

## 2023-02-18 SURGERY — PANNICULECTOMY
Anesthesia: General | Site: Breast | Laterality: Bilateral

## 2023-02-18 MED ORDER — ONDANSETRON HCL 4 MG/2ML IJ SOLN
INTRAMUSCULAR | Status: DC | PRN
  Administered 2023-02-18: 4 mg via INTRAVENOUS

## 2023-02-18 MED ORDER — ONDANSETRON HCL 4 MG/2ML IJ SOLN
4.0000 mg | Freq: Four times a day (QID) | INTRAMUSCULAR | Status: AC | PRN
  Administered 2023-02-18: 4 mg via INTRAVENOUS

## 2023-02-18 MED ORDER — OXYCODONE HCL 5 MG/5ML PO SOLN: 5.0000 mg | Freq: Once | ORAL | Status: AC | PRN

## 2023-02-18 MED ORDER — ONDANSETRON HCL 4 MG/2ML IJ SOLN
INTRAMUSCULAR | Status: AC
  Filled 2023-02-18: qty 2

## 2023-02-18 MED ORDER — PROPOFOL 10 MG/ML IV BOLUS
INTRAVENOUS | Status: DC | PRN
  Administered 2023-02-18: 100 mg via INTRAVENOUS

## 2023-02-18 MED ORDER — SUCCINYLCHOLINE CHLORIDE 200 MG/10ML IV SOSY
PREFILLED_SYRINGE | INTRAVENOUS | Status: AC
  Filled 2023-02-18: qty 10

## 2023-02-18 MED ORDER — DEXAMETHASONE SODIUM PHOSPHATE 10 MG/ML IJ SOLN
INTRAMUSCULAR | Status: AC
Start: 2023-02-18 — End: ?
  Filled 2023-02-18: qty 1

## 2023-02-18 MED ORDER — BUPIVACAINE LIPOSOME 1.3 % IJ SUSP
INTRAMUSCULAR | Status: AC
  Filled 2023-02-18: qty 20

## 2023-02-18 MED ORDER — AMISULPRIDE (ANTIEMETIC) 5 MG/2ML IV SOLN
10.0000 mg | Freq: Once | INTRAVENOUS | Status: AC
  Administered 2023-02-18: 10 mg via INTRAVENOUS

## 2023-02-18 MED ORDER — OXYCODONE HCL 5 MG PO TABS
5.0000 mg | ORAL_TABLET | Freq: Once | ORAL | Status: AC | PRN
  Administered 2023-02-18: 5 mg via ORAL

## 2023-02-18 MED ORDER — FENTANYL CITRATE (PF) 100 MCG/2ML IJ SOLN
INTRAMUSCULAR | Status: DC | PRN
  Administered 2023-02-18 (×4): 50 ug via INTRAVENOUS

## 2023-02-18 MED ORDER — FENTANYL CITRATE (PF) 100 MCG/2ML IJ SOLN
INTRAMUSCULAR | Status: AC
  Filled 2023-02-18: qty 2

## 2023-02-18 MED ORDER — SODIUM CHLORIDE (PF) 0.9 % IJ SOLN
INTRAMUSCULAR | Status: AC
  Filled 2023-02-18: qty 50

## 2023-02-18 MED ORDER — LIDOCAINE 2% (20 MG/ML) 5 ML SYRINGE
INTRAMUSCULAR | Status: AC
  Filled 2023-02-18: qty 5

## 2023-02-18 MED ORDER — SODIUM CHLORIDE (PF) 0.9 % IJ SOLN
INTRAMUSCULAR | Status: DC | PRN
  Administered 2023-02-18: 100 mL

## 2023-02-18 MED ORDER — ROCURONIUM BROMIDE 10 MG/ML (PF) SYRINGE
PREFILLED_SYRINGE | INTRAVENOUS | Status: AC
  Filled 2023-02-18: qty 10

## 2023-02-18 MED ORDER — ACETAMINOPHEN 10 MG/ML IV SOLN
INTRAVENOUS | Status: AC
  Filled 2023-02-18: qty 100

## 2023-02-18 MED ORDER — DEXAMETHASONE SODIUM PHOSPHATE 4 MG/ML IJ SOLN
INTRAMUSCULAR | Status: DC | PRN
  Administered 2023-02-18: 5 mg via INTRAVENOUS

## 2023-02-18 MED ORDER — 0.9 % SODIUM CHLORIDE (POUR BTL) OPTIME
TOPICAL | Status: DC | PRN
  Administered 2023-02-18 (×2): 1000 mL

## 2023-02-18 MED ORDER — ROCURONIUM BROMIDE 100 MG/10ML IV SOLN
INTRAVENOUS | Status: DC | PRN
  Administered 2023-02-18: 20 mg via INTRAVENOUS
  Administered 2023-02-18: 40 mg via INTRAVENOUS

## 2023-02-18 MED ORDER — CEFAZOLIN SODIUM-DEXTROSE 2-4 GM/100ML-% IV SOLN
INTRAVENOUS | Status: AC
  Filled 2023-02-18: qty 100

## 2023-02-18 MED ORDER — EPHEDRINE SULFATE (PRESSORS) 50 MG/ML IJ SOLN
INTRAMUSCULAR | Status: DC | PRN
  Administered 2023-02-18: 15 mg via INTRAVENOUS

## 2023-02-18 MED ORDER — ACETAMINOPHEN 10 MG/ML IV SOLN: 1000.0000 mg | Freq: Four times a day (QID) | INTRAVENOUS | Status: DC

## 2023-02-18 MED ORDER — MIDAZOLAM HCL 5 MG/5ML IJ SOLN
INTRAMUSCULAR | Status: DC | PRN
  Administered 2023-02-18: 1 mg via INTRAVENOUS

## 2023-02-18 MED ORDER — AMISULPRIDE (ANTIEMETIC) 5 MG/2ML IV SOLN
INTRAVENOUS | Status: AC
Start: 2023-02-18 — End: ?
  Filled 2023-02-18: qty 4

## 2023-02-18 MED ORDER — FENTANYL CITRATE (PF) 100 MCG/2ML IJ SOLN
25.0000 ug | INTRAMUSCULAR | Status: DC | PRN
Start: 2023-02-18 — End: 2023-02-18
  Administered 2023-02-18 (×3): 50 ug via INTRAVENOUS

## 2023-02-18 MED ORDER — CEFAZOLIN SODIUM-DEXTROSE 2-4 GM/100ML-% IV SOLN
2.0000 g | INTRAVENOUS | Status: AC
  Administered 2023-02-18: 2 g via INTRAVENOUS

## 2023-02-18 MED ORDER — MIDAZOLAM HCL 2 MG/2ML IJ SOLN
INTRAMUSCULAR | Status: AC
Start: 2023-02-18 — End: ?
  Filled 2023-02-18: qty 2

## 2023-02-18 MED ORDER — LACTATED RINGERS IV SOLN
INTRAVENOUS | Status: DC
Start: 2023-02-18 — End: 2023-02-18

## 2023-02-18 MED ORDER — BUPIVACAINE HCL (PF) 0.25 % IJ SOLN
INTRAMUSCULAR | Status: AC
  Filled 2023-02-18: qty 30

## 2023-02-18 MED ORDER — OXYCODONE HCL 5 MG PO TABS
ORAL_TABLET | ORAL | Status: AC
  Filled 2023-02-18: qty 1

## 2023-02-18 MED ORDER — SUGAMMADEX SODIUM 200 MG/2ML IV SOLN
INTRAVENOUS | Status: DC | PRN
  Administered 2023-02-18: 150 mg via INTRAVENOUS

## 2023-02-18 SURGICAL SUPPLY — 63 items
APPLIER CLIP 9.375 MED OPEN (MISCELLANEOUS)
BINDER ABDOMINAL 12 ML 46-62 (SOFTGOODS) IMPLANT
BINDER ABDOMINAL 12 SM 30-45 (SOFTGOODS) IMPLANT
BINDER ABDOMINAL 9 SM 30-45 (SOFTGOODS) IMPLANT
BIOPATCH RED 1 DISK 7.0 (GAUZE/BANDAGES/DRESSINGS) ×2 IMPLANT
BLADE CLIPPER SURG (BLADE) IMPLANT
BLADE SURG 10 STRL SS (BLADE) ×4 IMPLANT
BLADE SURG 11 STRL SS (BLADE) IMPLANT
BLADE SURG 15 STRL LF DISP TIS (BLADE) ×1 IMPLANT
BLADE SURG 15 STRL SS (BLADE) ×1
CANISTER SUCT 1200ML W/VALVE (MISCELLANEOUS) ×1 IMPLANT
CLIP APPLIE 9.375 MED OPEN (MISCELLANEOUS) IMPLANT
DERMABOND ADVANCED .7 DNX12 (GAUZE/BANDAGES/DRESSINGS) ×2 IMPLANT
DRAIN CHANNEL 19F RND (DRAIN) ×2 IMPLANT
DRAPE UTILITY XL STRL (DRAPES) ×1 IMPLANT
DRSG TEGADERM 4X4.75 (GAUZE/BANDAGES/DRESSINGS) IMPLANT
ELECT BLADE 4.0 EZ CLEAN MEGAD (MISCELLANEOUS)
ELECT COATED BLADE 2.86 ST (ELECTRODE) IMPLANT
ELECT REM PT RETURN 9FT ADLT (ELECTROSURGICAL) ×2
ELECTRODE BLDE 4.0 EZ CLN MEGD (MISCELLANEOUS) IMPLANT
ELECTRODE REM PT RTRN 9FT ADLT (ELECTROSURGICAL) ×2 IMPLANT
EVACUATOR SILICONE 100CC (DRAIN) ×2 IMPLANT
GAUZE PAD ABD 8X10 STRL (GAUZE/BANDAGES/DRESSINGS) ×3 IMPLANT
GAUZE SPONGE 2X2 STRL 8-PLY (GAUZE/BANDAGES/DRESSINGS) IMPLANT
GLOVE BIO SURGEON STRL SZ 6.5 (GLOVE) IMPLANT
GLOVE BIO SURGEON STRL SZ7.5 (GLOVE) ×1 IMPLANT
GLOVE BIO SURGEON STRL SZ8 (GLOVE) ×2 IMPLANT
GLOVE BIOGEL PI IND STRL 6.5 (GLOVE) IMPLANT
GLOVE BIOGEL PI IND STRL 7.0 (GLOVE) IMPLANT
GLOVE BIOGEL PI IND STRL 7.5 (GLOVE) IMPLANT
GLOVE BIOGEL PI IND STRL 8 (GLOVE) ×2 IMPLANT
GLOVE SURG SS PI 6.5 STRL IVOR (GLOVE) IMPLANT
GOWN STRL REUS W/ TWL LRG LVL3 (GOWN DISPOSABLE) ×1 IMPLANT
GOWN STRL REUS W/ TWL XL LVL3 (GOWN DISPOSABLE) ×1 IMPLANT
GOWN STRL REUS W/TWL LRG LVL3 (GOWN DISPOSABLE) ×4
GOWN STRL REUS W/TWL XL LVL3 (GOWN DISPOSABLE) ×3 IMPLANT
HEMOSTAT ARISTA ABSORB 3G PWDR (HEMOSTASIS) IMPLANT
HIBICLENS CHG 4% 4OZ BTL (MISCELLANEOUS) ×1 IMPLANT
NDL HYPO 22X1.5 SAFETY MO (MISCELLANEOUS) ×1 IMPLANT
NEEDLE HYPO 22X1.5 SAFETY MO (MISCELLANEOUS) ×1
NS IRRIG 1000ML POUR BTL (IV SOLUTION) ×1 IMPLANT
PACK BASIN DAY SURGERY FS (CUSTOM PROCEDURE TRAY) ×1 IMPLANT
PACK UNIVERSAL I (CUSTOM PROCEDURE TRAY) ×1 IMPLANT
PENCIL SMOKE EVACUATOR (MISCELLANEOUS) ×2 IMPLANT
PIN SAFETY STERILE (MISCELLANEOUS) ×1 IMPLANT
SLEEVE SCD COMPRESS KNEE MED (STOCKING) ×1 IMPLANT
SPONGE T-LAP 18X18 ~~LOC~~+RFID (SPONGE) ×2 IMPLANT
STAPLER INSORB 30 2030 C-SECTI (MISCELLANEOUS) IMPLANT
STAPLER SKIN PROX WIDE 3.9 (STAPLE) ×1 IMPLANT
SUT MNCRL AB 3-0 PS2 27 (SUTURE) ×2 IMPLANT
SUT MNCRL AB 4-0 PS2 18 (SUTURE) ×3 IMPLANT
SUT SILK 2 0 PERMA HAND 18 BK (SUTURE) ×2 IMPLANT
SUT VIC AB 2-0 CT1 TAPERPNT 27 (SUTURE) ×2 IMPLANT
SUT VIC AB 3-0 PS1 18XBRD (SUTURE) IMPLANT
SUT VIC AB 3-0 SH 27X BRD (SUTURE) IMPLANT
SUT VICRYL AB 3 0 TIES (SUTURE) IMPLANT
SYR BULB IRRIG 60ML STRL (SYRINGE) IMPLANT
SYR CONTROL 10ML LL (SYRINGE) ×1 IMPLANT
TOWEL GREEN STERILE FF (TOWEL DISPOSABLE) ×2 IMPLANT
TRAY DSU PREP LF (CUSTOM PROCEDURE TRAY) ×1 IMPLANT
TUBE CONNECTING 20X1/4 (TUBING) ×1 IMPLANT
UNDERPAD 30X36 HEAVY ABSORB (UNDERPADS AND DIAPERS) ×2 IMPLANT
YANKAUER SUCT BULB TIP NO VENT (SUCTIONS) ×1 IMPLANT

## 2023-02-18 NOTE — Anesthesia Preprocedure Evaluation (Signed)
Anesthesia Evaluation  Patient identified by MRN, date of birth, ID band Patient awake    Reviewed: Allergy & Precautions, H&P , NPO status , Patient's Chart, lab work & pertinent test results  Airway Mallampati: II   Neck ROM: full    Dental   Pulmonary COPD, former smoker   breath sounds clear to auscultation       Cardiovascular negative cardio ROS  Rhythm:regular Rate:Normal     Neuro/Psych  PSYCHIATRIC DISORDERS Anxiety Depression       GI/Hepatic   Endo/Other  diabetes, Type 2    Renal/GU      Musculoskeletal  (+) Arthritis ,    Abdominal   Peds  Hematology   Anesthesia Other Findings   Reproductive/Obstetrics                             Anesthesia Physical Anesthesia Plan  ASA: 3  Anesthesia Plan: General   Post-op Pain Management:    Induction: Intravenous  PONV Risk Score and Plan: 3 and Ondansetron, Dexamethasone, Midazolam and Treatment may vary due to age or medical condition  Airway Management Planned: Oral ETT  Additional Equipment:   Intra-op Plan:   Post-operative Plan: Extubation in OR  Informed Consent: I have reviewed the patients History and Physical, chart, labs and discussed the procedure including the risks, benefits and alternatives for the proposed anesthesia with the patient or authorized representative who has indicated his/her understanding and acceptance.     Dental advisory given  Plan Discussed with: CRNA, Anesthesiologist and Surgeon  Anesthesia Plan Comments:        Anesthesia Quick Evaluation

## 2023-02-18 NOTE — Op Note (Signed)
DATE OF OPERATION: 02/18/2023  LOCATION: [facility] operating Room  PREOPERATIVE DIAGNOSIS: Panniculitis  POSTOPERATIVE DIAGNOSIS: Same  PROCEDURE: Panniculectomy  SURGEON: Loren Racer, MD  ASSISTANT: Caroline More, Filomena Jungling medical student  EBL: 200 cc  CONDITION: Stable  COMPLICATIONS: None  INDICATION: The patient, [name], is a 67 y.o. female born on [birth year], is here for treatment ongoing rashes on the posterior aspect of her pannus and in the intertriginous regions.   PROCEDURE DETAILS:  The patient was seen prior to surgery and marked.   IV antibiotics were given. The patient was taken to the operating room and given a general anesthetic. A standard time out was performed and all information was confirmed by those in the room. SCDs were placed.   The abdomen was prepped and draped in usual sterile manner.  A transverse incision was made across the lower portion of the abdomen with scalpel and dissection carried out down to the level of the anterior abdominal wall fascia with electrocautery.  The skin and fat were elevated in a caudad to cranial manner to the level of the umbilicus.  A portion of skin that could be removed with out compromising the closure was identified and the scalpel was used to make the upper cut.  Skin of fat was removed and the wound was inspected for bleeding and meticulous hemostasis achieved with the electrocautery and suture ligation.  The patient did have more bleeding than usual probably due to the increased vascularity of the fat.  The wound was irrigated with warm normal saline and the subfascial space above the rectus muscle and external obliques and in the subcutaneous tissues were infiltrated with a combination of Exparel mixed with quarter percent plain bupivacaine and 50 mL of saline.  The wound was irrigated a second time and [address] [place] round drains were placed in the wound and brought out through separate stab incisions.  I  elected to also use Arista hemostatic agent in the wound to help with hemostasis.  The skin edges were then tailor tacked in place with skin clips and Scarpa's fascia approximated with interrupted 2-0 Vicryl sutures.  The dermis was closed with interrupted and running 3-0 Monocryl sutures and the skin was closed with skin clips.  The patient was was placed in a supportive garment with bandages and awakened from anesthesia without incident.  She was transferred to the recovery room in good condition.  All instrument needle and sponge counts were reported as correct and there were no complications noted during the procedure. The patient was allowed to wake up and taken to recovery room in stable condition at the end of the case. The family was notified at the end of the case.   The advanced practice practitioner (APP) assisted throughout the case.  The APP was essential in retraction and counter traction when needed to make the case progress smoothly.  This retraction and assistance made it possible to see the tissue plans for the procedure.  The assistance was needed for blood control, tissue re-approximation and assisted with closure of the incision site.

## 2023-02-18 NOTE — Anesthesia Procedure Notes (Signed)
Procedure Name: Intubation Date/Time: 02/18/2023 12:47 PM  Performed by: Ronnette Hila, CRNAPre-anesthesia Checklist: Patient identified, Emergency Drugs available, Suction available and Patient being monitored Patient Re-evaluated:Patient Re-evaluated prior to induction Oxygen Delivery Method: Circle system utilized Preoxygenation: Pre-oxygenation with 100% oxygen Induction Type: IV induction Ventilation: Mask ventilation without difficulty Laryngoscope Size: Mac and 3 Grade View: Grade I Tube type: Oral Tube size: 7.0 mm Number of attempts: 1 Airway Equipment and Method: Stylet and Oral airway Placement Confirmation: ETT inserted through vocal cords under direct vision, positive ETCO2 and breath sounds checked- equal and bilateral Secured at: 19 cm Tube secured with: Tape Dental Injury: Teeth and Oropharynx as per pre-operative assessment

## 2023-02-18 NOTE — Discharge Instructions (Addendum)
INSTRUCTIONS FOR AFTER ABDOMINAL SURGERY   You will likely have some questions about what to expect following your operation.  The following information will help you and your family understand what to expect when you are discharged from the hospital.  It is important to follow these guidelines to help ensure a smooth recovery and reduce complication.  Postoperative instructions include information on: diet, wound care, medications and physical activity.  AFTER SURGERY Expect to go home after the procedure.  In some cases, you may need to spend one night in the hospital for observation.  DIET Abdominal surgery does not require a specific diet.  However, the healthier you eat the better your body will heal. It is important to increasing your protein intake.  This means limiting the foods with sugar and carbohydrates.  Focus on vegetables and some meat.  If you have liposuction during your procedure be sure to drink water.  If your urine is bright yellow, then it is concentrated, and you need to drink more water.  As a general rule after surgery, you should have 8 ounces of water every hour while awake.  If you find you are persistently nauseated or unable to take in liquids let us know.  NO TOBACCO USE or EXPOSURE.  This will slow your healing process and lead to a wound.  WOUND CARE Leave the binder on at all times except when showering . Use fragrance free soap like Dial, Dove or [place].   After 24 hours you can remove the binder to shower. Once dry apply binder or compressive spanx. No baths, pools or hot tubs for four weeks. We close your incision to leave the smallest and best-looking scar. No ointment or creams on your incisions for four weeks.  No Neosporin (Too many skin reactions).  A few weeks after surgery you can use Mederma and start massaging the scar. We ask you to wear your binder or compressive spanx for the first 6 weeks around the clock, including while sleeping. This provides added  comfort and helps reduce the fluid accumulation at the surgery site. NO Ice or heating pads to the operative site.  You have a very high risk of a BURN before you feel the temperature change. Continue to empty, recharge, & record drainage from drains 2-3 times a day, as needed.  ACTIVITY No heavy lifting until cleared by the doctor.  This usually means no more than a half-gallon of milk.  It is OK to walk and climb stairs. Moving your legs is very important to decrease your risk of a blood clot.  It will also help keep you from getting deconditioned.  Every 1 to 2 hours get up and walk for 5 minutes. This will help with a quicker recovery back to normal.  Let pain be your guide so you don't do too much.  This time is for you to recover.  You will be more comfortable if you sleep and rest with your head elevated either with a few pillows under you or in a recliner.  No stomach sleeping for a three months.  WORK Everyone returns to work at different times. As a rough guide, most people take at least 1 - 2 weeks off prior to returning to work. If you need documentation for your job, give the forms to the front staff at the clinic.  DRIVING Arrange for someone to bring you home from the hospital after your surgery.  You may be able to drive a few days after  surgery but not while taking any narcotics or valium.  BOWEL MOVEMENTS Constipation can occur after anesthesia and while taking pain medication.  It is important to stay ahead for your comfort.  We recommend taking Milk of Magnesia (2 tablespoons; twice a day) while taking the pain pills.  MEDICATIONS You may be prescribed should start after surgery At your preoperative visit for you history and physical you may have been given the following medications: Zofran 4 mg:  This is to treat nausea and vomiting.  You can take this every 6 hours as needed and only if needed.  YOU MAY RESTART YOUR ASPIRIN 24 HOURS AFTER SURGERY   Over the counter  Medication to take: Ibuprofen (Motrin) 600 mg:  Take this every 6 hours.  If you have additional pain then take 500 mg of the Tylenol every 8 hours.  Only take the Norco after you have tried these two. No Tylenol until after 9:45pm today, if needed.  MiraLAX or Milk of Magnesia: Take this according to the bottle if you take the Norco.  WHEN TO CALL Call your surgeon's office if any of the following occur: Fever 101 degrees F or greater Excessive bleeding or fluid from the incision site. Pain that increases over time without aid from the medications Redness, warmth, or pus draining from incision sites Persistent nausea or inability to take in liquids Severe misshapen area that underwent the operation.   Post Anesthesia Home Care Instructions  Activity: Get plenty of rest for the remainder of the day. A responsible individual must stay with you for 24 hours following the procedure.  For the next 24 hours, DO NOT: -Drive a car -Advertising copywriter -Drink alcoholic beverages -Take any medication unless instructed by your physician -Make any legal decisions or sign important papers.  Meals: Start with liquid foods such as gelatin or soup. Progress to regular foods as tolerated. Avoid greasy, spicy, heavy foods. If nausea and/or vomiting occur, drink only clear liquids until the nausea and/or vomiting subsides. Call your physician if vomiting continues.  Special Instructions/Symptoms: Your throat may feel dry or sore from the anesthesia or the breathing tube placed in your throat during surgery. If this causes discomfort, gargle with warm salt water. The discomfort should disappear within 24 hours.  If you had a scopolamine patch placed behind your ear for the management of post- operative nausea and/or vomiting:  1. The medication in the patch is effective for 72 hours, after which it should be removed.  Wrap patch in a tissue and discard in the trash. Wash hands thoroughly with soap and  water. 2. You may remove the patch earlier than 72 hours if you experience unpleasant side effects which may include dry mouth, dizziness or visual disturbances. 3. Avoid touching the patch. Wash your hands with soap and water after contact with the patch.   Information for Discharge Teaching: EXPAREL (bupivacaine liposome injectable suspension)   Pain relief is important to your recovery. The goal is to control your pain so you can move easier and return to your normal activities as soon as possible after your procedure. Your physician may use several types of medicines to manage pain, swelling, and more.  Your surgeon or anesthesiologist gave you EXPAREL(bupivacaine) to help control your pain after surgery.  EXPAREL is a local anesthetic designed to release slowly over an extended period of time to provide pain relief by numbing the tissue around the surgical site. EXPAREL is designed to release pain medication over time and  can control pain for up to 72 hours. Depending on how you respond to EXPAREL, you may require less pain medication during your recovery. EXPAREL can help reduce or eliminate the need for opioids during the first few days after surgery when pain relief is needed the most. EXPAREL is not an opioid and is not addictive. It does not cause sleepiness or sedation.   Important! A teal colored band has been placed on your arm with the date, time and amount of EXPAREL you have received. Please leave this armband in place for the full 96 hours following administration, and then you may remove the band. If you return to the hospital for any reason within 96 hours following the administration of EXPAREL, the armband provides important information that your health care providers to know, and alerts them that you have received this anesthetic.    Possible side effects of EXPAREL: Temporary loss of sensation or ability to move in the area where medication was injected. Nausea, vomiting,  constipation Rarely, numbness and tingling in your mouth or lips, lightheadedness, or anxiety may occur. Call your doctor right away if you think you may be experiencing any of these sensations, or if you have other questions regarding possible side effects.  Follow all other discharge instructions given to you by your surgeon or nurse. Eat a healthy diet and drink plenty of water or other fluids.  No Oxycodone until after 10:00pm today.About my Jackson-Pratt Bulb Drain  What is a Jackson-Pratt bulb? A Jackson-Pratt is a soft, round device used to collect drainage. It is connected to a long, thin drainage catheter, which is held in place by one or two small stiches near your surgical incision site. When the bulb is squeezed, it forms a vacuum, forcing the drainage to empty into the bulb.  Emptying the Jackson-Pratt bulb- To empty the bulb: 1. Release the plug on the top of the bulb. 2. Pour the bulb's contents into a measuring container which your nurse will provide. 3. Record the time emptied and amount of drainage. Empty the drain(s) as often as your     doctor or nurse recommends.  Date                  Time                    Amount (Drain 1)                 Amount (Drain 2)  _____________________________________________________________________  _____________________________________________________________________  _____________________________________________________________________  _____________________________________________________________________  _____________________________________________________________________  _____________________________________________________________________  _____________________________________________________________________  _____________________________________________________________________  Squeezing the Jackson-Pratt Bulb- To squeeze the bulb: 1. Make sure the plug at the top of the bulb is open. 2. Squeeze the bulb tightly in your  fist. You will hear air squeezing from the bulb. 3. Replace the plug while the bulb is squeezed. 4. Use a safety pin to attach the bulb to your clothing. This will keep the catheter from     pulling at the bulb insertion site.  When to call your doctor- Call your doctor if: Drain site becomes red, swollen or hot. You have a fever greater than 101 degrees F. There is oozing at the drain site. Drain falls out (apply a guaze bandage over the drain hole and secure it with tape). Drainage increases daily not related to activity patterns. (You will usually have more drainage when you are active than when you are resting.) Drainage has a bad odor.

## 2023-02-18 NOTE — Interval H&P Note (Signed)
History and Physical Interval Note: No change in exam or indication for surgery. She does have a rash on the posterior aspect of the pannus but it should not interfere with surgery. All questions answered. Marked with her concurrence. Will proceed at her request.    02/18/2023 11:53 AM  Flo Shanks  has presented today for surgery, with the diagnosis of pannius.  The various methods of treatment have been discussed with the patient and family. After consideration of risks, benefits and other options for treatment, the patient has consented to  Procedure(s): PANNICULECTOMY (Bilateral) as a surgical intervention.  The patient's history has been reviewed, patient examined, no change in status, stable for surgery.  I have reviewed the patient's chart and labs.  Questions were answered to the patient's satisfaction.     Santiago Glad

## 2023-02-18 NOTE — Transfer of Care (Signed)
Immediate Anesthesia Transfer of Care Note  Patient: SUMIYAH PIGG  Procedure(s) Performed: PANNICULECTOMY (Bilateral: Breast)  Patient Location: PACU  Anesthesia Type:General  Level of Consciousness: awake, alert , oriented, drowsy, and patient cooperative  Airway & Oxygen Therapy: Patient Spontanous Breathing and Patient connected to face mask oxygen  Post-op Assessment: Report given to RN and Post -op Vital signs reviewed and stable  Post vital signs: Reviewed and stable  Last Vitals:  Vitals Value Taken Time  BP    Temp    Pulse 77 02/18/23 1521  Resp    SpO2 99 % 02/18/23 1521  Vitals shown include unfiled device data.  Last Pain:  Vitals:   02/18/23 1141  TempSrc: Temporal  PainSc: 8          Complications: No notable events documented.

## 2023-02-19 ENCOUNTER — Encounter (HOSPITAL_BASED_OUTPATIENT_CLINIC_OR_DEPARTMENT_OTHER): Payer: Self-pay | Admitting: Plastic Surgery

## 2023-02-19 NOTE — Anesthesia Postprocedure Evaluation (Signed)
Anesthesia Post Note  Patient: [name]  Procedure(s) Performed: PANNICULECTOMY (Bilateral: Breast)     Patient location during evaluation: PACU Anesthesia Type: General Level of consciousness: awake and alert Pain management: pain level controlled Vital Signs Assessment: post-procedure vital signs reviewed and stable Respiratory status: spontaneous breathing, nonlabored ventilation, respiratory function stable and patient connected to nasal cannula oxygen Cardiovascular status: blood pressure returned to baseline and stable Postop Assessment: no apparent nausea or vomiting Anesthetic complications: no   No notable events documented.  Last Vitals:  Vitals:   02/18/23 1615 02/18/23 1630  BP: 130/63 (!) 152/72  Pulse: 65 78  Resp: 10 16  Temp:  (!) 36.2 C  SpO2: 97% 96%    Last Pain:  Vitals:   02/18/23 1630  TempSrc:   PainSc: 8                  Khari Lett S

## 2023-02-26 ENCOUNTER — Ambulatory Visit: Payer: 59 | Admitting: Plastic Surgery

## 2023-02-26 ENCOUNTER — Encounter: Payer: Self-pay | Admitting: Plastic Surgery

## 2023-02-26 VITALS — BP 116/56 | HR 75 | Ht 61.0 in | Wt 116.6 lb

## 2023-02-26 DIAGNOSIS — Z9889 Other specified postprocedural states: Secondary | ICD-10-CM

## 2023-02-26 NOTE — Progress Notes (Signed)
Ms. Durakovic returns today approximately 1 week postop after a panniculectomy.  She is doing okay.  She is still having some discomfort especially when she stands.  She states that her right leg feels weaker than the left.  On examination the incision is clean dry and intact.  There is minimal ecchymoses along the incision.  Neither of the bulbs on her suction are compressed.  We discussed at length the importance of maintaining suction on the drains.  Once I added suction again today there was obvious fluid removed.  I elected to leave the drains in place until next Monday.  She may slowly begin increasing activity.  She is encouraged to continue wearing her compression though she may change to compression shorts or girdle if she desires.  Follow-up Monday

## 2023-03-03 ENCOUNTER — Encounter: Payer: Self-pay | Admitting: Surgical

## 2023-03-03 ENCOUNTER — Ambulatory Visit (INDEPENDENT_AMBULATORY_CARE_PROVIDER_SITE_OTHER): Payer: 59 | Admitting: Surgical

## 2023-03-03 VITALS — BP 143/60 | HR 78

## 2023-03-03 DIAGNOSIS — Z9889 Other specified postprocedural states: Secondary | ICD-10-CM

## 2023-03-03 NOTE — Progress Notes (Signed)
67 year old female status post panniculectomy with Dr. Ladona Ridgel on 02/18/2023.  She is 2 weeks postop.  She is here today for postop evaluation and possible drain removal.  She is here with her daughter.  She reports overall she is doing well, she does report that she has continued to have about 30 cc of output or more per day of drainage from bilateral JP drains.  She forgot her drain log today, but reports that has been about 30 cc or more per day per JP drain.  She reports she does have some ongoing tenderness  She reports she has not been drinking much water, reports she has been drinking a lot of 0 sodas and diet teas.   Chaperone present on exam On exam abdominal incision is intact and overall appears to be healing well.  She does have some erythema along the left lateral incision surrounding the staples.  There is no cellulitic changes.  There is no subcutaneous fluid collection noted palpation.  Staples are all in place along the entire abdominal incision.  Bilateral JP drains are in place with dark sanguinous drainage in each bulb.  She does have some abrasions within the bilateral inguinal crease   A/P:  JP drain output is too high at this time, would recommend continue to monitor.  I did discuss following up later this week, however patient would be unable to come later this week as she would not have transportation as her daughter will be working during that time.  The next available time that she would be able to come to the office would be Wednesday next week based on her daughters availability for transportation.  Recommend continue with compressive garments and avoiding strenuous activities  Recommend applying thin layer of Vaseline to abdominal incision and inguinal crease abrasions.  Discussed with patient the abrasions are possibly from swelling or from the compression binder.  There is no signs of infection or concern on exam, but she does have some irritation from the staples in  the left side.  Recommend closely monitoring this.   We discussed continuing to ambulate, continue with compression, and recommend increasing her water intake.  Pictures were obtained of the patient and placed in the chart with the patient's or guardian's permission.

## 2023-03-10 ENCOUNTER — Telehealth: Payer: Self-pay | Admitting: Plastic Surgery

## 2023-03-10 NOTE — Telephone Encounter (Signed)
Spoke with patient's aunt, notified her that tissue in the bulb is normal.  She reports everything else was doing well.  We will plan to see Ms. Janak in 2 days for follow-up.  She did not report any other concerns.

## 2023-03-10 NOTE — Telephone Encounter (Signed)
Patient's niece is describing an issue within the bulb, it looks like a meaty tissue, looks like a worm, a 3/4 of the diameter of a cup, has yellowish hue to it, doesn't look like a blood clot, she would like to know what to do? Please reach out to her if possible, Aunt had asked her reach out to [place] please call [phone].

## 2023-03-12 ENCOUNTER — Ambulatory Visit: Payer: 59 | Admitting: Student

## 2023-03-12 DIAGNOSIS — Z9889 Other specified postprocedural states: Secondary | ICD-10-CM

## 2023-03-12 MED ORDER — CEPHALEXIN 500 MG PO CAPS: 500.0000 mg | ORAL_CAPSULE | Freq: Four times a day (QID) | ORAL | 0 refills | Status: AC

## 2023-03-12 NOTE — Progress Notes (Signed)
Patient is a 67 year old female who underwent panniculectomy with Dr. Ladona Ridgel on 02/18/2023.  She is 3 weeks postop.  She presents to the clinic today for postoperative follow-up.  She was last seen in the office on 03/03/2023.  At this visit, she was doing well.  She reported that she was having about 30 cc of output or more per day of drainage from her JP drains bilaterally.  On exam, incision was overall intact and healing well.  There was some erythema along the lateral incision surrounding the staples.  No fluid collections on exam.  There were some abrasions along the bilateral inguinal crease.  It was recommended to the patient that she continue compressive garments and avoid strenuous activities.  Is recommended that she apply a thin layer of Vaseline to the abdominal incision and inguinal crease abrasions.  Today, patient presents with her family member at bedside.  She states that overall she is doing pretty well.  She states that she is having some tenderness and redness at her left drain site.  Denies any fevers or chills.  She reports that her drain output has been about 30 to 40 cc per 24 hours from both drains.  Denies any other issues or concerns at this time.  Chaperone present on exam.  On exam, patient is sitting upright in no acute distress.  Abdomen is soft and nontender.  No fluid collections palpated on exam.  There is no overlying erythema to the abdomen.  Incision is intact with staples.  There is some mild surrounding erythema to the incision.  There is some superficial scabbing noted on the left side.  Right JP drain is intact and functioning with dark serous drainage in the bulb.  Left JP drain is intact and functioning with some dark serous drainage in the bulb.  There is erythema and tenderness around the left drain site, could be reflective of early cellulitis versus irritation.  Staples were removed at bedside without any difficulty.  Patient tolerated well.  Given that  patient has had both of her drains in for 3 weeks, decision was made to pull one of the drains.  Left drain was removed without difficulty.  Patient tolerated well.  Discussed with patient given her erythema and tenderness to the left drain site, I will prescribe her antibiotics.  She denies any penicillin allergies.  Discussed with patient I like her to apply Vaseline to her incision daily.  Discussed with her she can apply a dressing over the drain site as it may drain a little bit over the next few days.  Patient expressed understanding.  We will have the patient follow back up next week for reevaluation and most likely drain removal given that I will be 4 weeks postop.  Instructed patient to call in the meantime if she has any questions or concerns about anything.  Pictures were obtained of the patient and placed in the chart with the patient's or guardian's permission.

## 2023-03-13 ENCOUNTER — Other Ambulatory Visit: Payer: Self-pay | Admitting: Student

## 2023-03-13 ENCOUNTER — Telehealth: Payer: Self-pay | Admitting: Plastic Surgery

## 2023-03-13 MED ORDER — FLUCONAZOLE 150 MG PO TABS: 150.0000 mg | ORAL_TABLET | Freq: Once | ORAL | 0 refills | Status: AC

## 2023-03-13 NOTE — Progress Notes (Signed)
Patient called stating that she tends to get yeast infections when taking antibiotics.  Will call her in a one-time dose of Diflucan in the event that she has a yeast infection.

## 2023-03-13 NOTE — Telephone Encounter (Signed)
Pt called and says she was prescribed antibiotics from Dr Ladona Ridgel. She says that antibiotics cause her to have yeast infections and wants to know if there is a medication that Dr Ladona Ridgel can prescribe for this.

## 2023-03-13 NOTE — Telephone Encounter (Signed)
Called patient back. Will send in diflucan.

## 2023-03-17 ENCOUNTER — Ambulatory Visit (INDEPENDENT_AMBULATORY_CARE_PROVIDER_SITE_OTHER): Payer: 59 | Admitting: Student

## 2023-03-17 DIAGNOSIS — Z9889 Other specified postprocedural states: Secondary | ICD-10-CM

## 2023-03-17 NOTE — Progress Notes (Signed)
Patient is a 67 year old female who underwent panniculectomy with Dr. Ladona Ridgel on 02/18/2023.  She is almost 4 weeks postop.  She presents to the clinic today for postoperative follow-up.  Patient was last seen in the clinic on 03/12/2023.  At this visit, patient reported she was doing okay.  She did state that her drain output had been about 30 to 40 cc per 24 hours from both drains.  She was also reporting some tenderness and redness at her left drain site.  Abdomen was soft and nontender.  No fluid collections on exam.  Superficial scabbing was noted to the left side.  JP drains were in place and functioning.  There was some erythema and tenderness around the left drain site.  Left drain was removed without difficulty.  Staples were removed without difficulty.  Antibiotics were started given erythema and tenderness to the left drain site.  Today, patient presents with her family member at bedside.  Patient reports that she is doing well.  She states that she has a little bit of drainage from her previous drain site, otherwise denies any new issues or concerns.  Denies any fevers or chills.  Reports her right drain has been putting out approximately 25 to 30 cc/day.  Reports she has been applying Vaseline to her incision and has been taking the antibiotic  Chaperone present on exam.  On exam, patient is sitting upright in no acute distress.  Abdomen is soft and nontender.  There is no overlying erythema.  No obvious fluid collections on exam.  There is a small superficial wound noted just lateral/left of midline with superficial exudate noted.  No signs of infection.  Incision otherwise appears to be intact and healing well.  There is some mild surrounding erythema that appears to be consistent with irritation rather than cellulitis.  There was 1 staple remaining in the incision.  This was removed without any difficulty.  Tenderness and erythema to left drain site appeared to have improved significantly.   Right JP drain is in place and functioning with very minimal serous drainage in the bulb.  JP drain was removed without any difficulty.  Patient tolerated well.  Discussed with patient lunch that she may apply Vaseline to her incision into the drain sites daily.  Discussed with her the right drain site may drain a little bit, she may apply gauze, ABD pad or maxi pad over the area.  Patient expressed understanding.  Discussed with patient the importance of wearing her compression at all times.  Patient to follow back up in 2 weeks.  I instructed her to call in the meantime if she has any questions or concerns about anything.

## 2023-03-31 ENCOUNTER — Ambulatory Visit (INDEPENDENT_AMBULATORY_CARE_PROVIDER_SITE_OTHER): Payer: 59 | Admitting: Student

## 2023-03-31 DIAGNOSIS — Z9889 Other specified postprocedural states: Secondary | ICD-10-CM

## 2023-03-31 NOTE — Progress Notes (Cosign Needed)
Patient is a 67 year old female who underwent panniculectomy with Dr. Ladona Ridgel on 02/18/2023.  She is almost 6 weeks postop.  She presents to the clinic today for postoperative follow-up.  Patient was last seen in the clinic on 03/17/2023.  At this visit, patient reported she was doing well.  On exam, her abdomen was soft and nontender.  There was a small superficial wound just lateral/left of midline.  Incision was overall intact and healing well.  JP drain was removed without any difficulty.  Patient was to follow-up in 2 weeks.  Today, patient presents with her sister at bedside.  She reports that she is overall doing pretty well.  She does state that the area where her left JP drain was in place is still bothering her a little bit.  She denies any fevers or chills.  She also states that she sometimes has swelling in her feet bilaterally.  Denies any unilateral swelling.  Denies any shortness of breath or chest pain.  Denies any other issues or concerns.  Reports she has been applying Vaseline to her incision.  Chaperone present on exam.  On exam, patient is sitting upright in no acute distress.  Abdomen is soft and nontender.  There is no overlying erythema.  No obvious fluid collections on exam.  There is some superficial scabbing /eschar noted to the left side of the incision.  This was debrided with scissors and underneath there was exudate noted.  No surrounding erythema or active drainage on exam.  No signs of infection on exam.  Left JP drain site is noted to have a little bit of eschar with exudate.  There is no active drainage or surrounding erythema.  No signs of infection.  Discussed with patient would like her to apply Vaseline to her incisions daily and to the drain site.  Discussed with her it does not appear infected at this time and to continue to monitor it and apply the Vaseline daily.  Patient expressed understanding.  Discussed with patient that she no longer has to wear compression  and that she may start gradually increasing her activities.  Recommended that she elevate her legs while she is lying down and to wear compression socks to help with the swelling in her lower extremities.  Patient expressed understanding.  Patient to follow back up in 3 weeks.  Instructed her to call in the meantime she has any questions or concerns about anything.

## 2023-04-14 ENCOUNTER — Ambulatory Visit (INDEPENDENT_AMBULATORY_CARE_PROVIDER_SITE_OTHER): Payer: 59 | Admitting: Student

## 2023-04-14 VITALS — BP 156/73 | HR 62

## 2023-04-14 DIAGNOSIS — Z9889 Other specified postprocedural states: Secondary | ICD-10-CM

## 2023-04-14 NOTE — Progress Notes (Signed)
 Patient is a 68 year old female who underwent panniculectomy with Dr. Waddell on 02/18/2023.  She is almost 2 months postop.  She presents to the clinic today for postoperative follow-up.  Patient was last seen in the clinic on 03/31/2023.  At this visit, patient reported she was doing well overall.  She did state that she still had some discomfort where her left JP drain site was.  On exam, abdomen was soft and nontender.  There was some superficial scabbing/eschar noted to the left side of the incision.  No signs of infection on exam.  Today, patient is accompanied by her sister at bedside.  Patient reports she is doing really well.  She states that the left drain site still bothers her a little bit.  She denies any drainage from the area.  She denies any fevers or chills.  Denies any other concerns.  Chaperone present on exam.  On exam patient is sitting upright in no acute distress.  Abdomen is soft and nontender.  There is no overlying erythema.  Obvious fluid collections palpated on exam.  There is some superficial exudate noted to the left aspect of the incision.  This was debrided with scissors.  Patient tolerated well.  Underneath, there is a small superficial wound.  The remainder of the incision appears to be healing well.  Left drain site appears to be improving, appears much improved from previous exam..  There is some mild tenderness to palpation.  No surrounding erythema.  No drainage on exam.  There is some firmness underneath it, consistent with some scar tissue underneath.  No fluctuance noted on exam.  No obvious signs of infection on exam.  Discussed with the patient that she should continue to apply Vaseline to her incision daily, and make sure that she is getting the areas where the superficial scabbing and wounds are.  Also recommended that she continue to apply Vaseline to the left drain site daily.  Also discussed with her she may gently massage this area as some of the scar tissue  may be contributing to her discomfort.  The patient expressed understanding.  Discussed with patient that she should continue to monitor the left drain site closely though and if she has any worsening symptoms or changes she should let us  know.  Patient expressed understanding.  Patient to follow back up in 3 weeks.  Instructed her to call in the meantime she has any questions or concerns.  Patient was also inquiring about what she can do about her wrinkles.  We did discuss different options such as lasers and topical treatments.  I recommended that she start by seeing our aesthetician, Lilly for further options and treatments.  Patient was in agreement with this.  Pictures were obtained of the patient and placed in the chart with the patient's or guardian's permission.

## 2023-04-22 ENCOUNTER — Ambulatory Visit: Payer: 59 | Admitting: Dermatology

## 2023-04-23 ENCOUNTER — Ambulatory Visit: Payer: 59 | Admitting: Dermatology

## 2023-04-23 DIAGNOSIS — L820 Inflamed seborrheic keratosis: Secondary | ICD-10-CM | POA: Diagnosis not present

## 2023-04-23 DIAGNOSIS — L570 Actinic keratosis: Secondary | ICD-10-CM

## 2023-04-23 DIAGNOSIS — L578 Other skin changes due to chronic exposure to nonionizing radiation: Secondary | ICD-10-CM | POA: Diagnosis not present

## 2023-04-23 DIAGNOSIS — W908XXA Exposure to other nonionizing radiation, initial encounter: Secondary | ICD-10-CM

## 2023-04-23 DIAGNOSIS — Z5111 Encounter for antineoplastic chemotherapy: Secondary | ICD-10-CM

## 2023-04-23 DIAGNOSIS — L739 Follicular disorder, unspecified: Secondary | ICD-10-CM

## 2023-04-23 MED ORDER — FLUOCINONIDE 0.05 % EX SOLN: CUTANEOUS | 3 refills | Status: DC

## 2023-04-23 MED ORDER — KETOCONAZOLE 2 % EX SHAM: MEDICATED_SHAMPOO | CUTANEOUS | 3 refills | Status: DC

## 2023-04-23 NOTE — Patient Instructions (Addendum)
-   In 1 week, start 5-fluorouracil/calcipotriene cream twice a day for 5-7 days to affected areas including nose, cheeks, upper lip.  5-Fluorouracil/Calcipotriene Patient Education   Actinic keratoses are the dry, red scaly spots on the skin caused by sun damage. A portion of these spots can turn into skin cancer with time, and treating them can help prevent development of skin cancer.   Treatment of these spots requires removal of the defective skin cells. There are various ways to remove actinic keratoses, including freezing with liquid nitrogen, treatment with creams, or treatment with a blue light procedure in the office.   5-fluorouracil cream is a topical cream used to treat actinic keratoses. It works by interfering with the growth of abnormal fast-growing skin cells, such as actinic keratoses. These cells peel off and are replaced by healthy ones.   5-fluorouracil/calcipotriene is a combination of the 5-fluorouracil cream with a vitamin D analog cream called calcipotriene. The calcipotriene alone does not treat actinic keratoses. However, when it is combined with 5-fluorouracil, it helps the 5-fluorouracil treat the actinic keratoses much faster so that the same results can be achieved with a much shorter treatment time.  INSTRUCTIONS FOR 5-FLUOROURACIL/CALCIPOTRIENE CREAM:   5-fluorouracil/calcipotriene cream typically only needs to be used for 4-7 days. A thin layer should be applied twice a day to the treatment areas recommended by your physician.   If your physician prescribed you separate tubes of 5-fluourouracil and calcipotriene, apply a thin layer of 5-fluorouracil followed by a thin layer of calcipotriene.   Avoid contact with your eyes, nostrils, and mouth. Do not use 5-fluorouracil/calcipotriene cream on infected or open wounds.   You will develop redness, irritation and some crusting at areas where you have pre-cancer damage/actinic keratoses. IF YOU DEVELOP PAIN, BLEEDING, OR  SIGNIFICANT CRUSTING, STOP THE TREATMENT EARLY - you have already gotten a good response and the actinic keratoses should clear up well.  Wash your hands after applying 5-fluorouracil 5% cream on your skin.   A moisturizer or sunscreen with a minimum SPF 30 should be applied each morning.   Once you have finished the treatment, you can apply a thin layer of Vaseline twice a day to irritated areas to soothe and calm the areas more quickly. If you experience significant discomfort, contact your physician.  For some patients it is necessary to repeat the treatment for best results.  SIDE EFFECTS: When using 5-fluorouracil/calcipotriene cream, you may have mild irritation, such as redness, dryness, swelling, or a mild burning sensation. This usually resolves within 2 weeks. The more actinic keratoses you have, the more redness and inflammation you can expect during treatment. Eye irritation has been reported rarely. If this occurs, please let us know.  If you have any trouble using this cream, please call the office. If you have any other questions about this information, please do not hesitate to ask me before you leave the office.

## 2023-04-23 NOTE — Progress Notes (Signed)
Follow-Up Visit   Subjective  [name] is a 68 y.o. female who presents for the following: 6 month follow-up Actinic keratosis of the face. She has 5FU/Calcipotriene cream, but hasn't used it yet. She also has bumps in the scalp that are bothersome. The patient has spots, moles and lesions to be evaluated, some may be new or changing and the patient may have concern these could be cancer.   The following portions of the chart were reviewed this encounter and updated as appropriate: medications, allergies, medical history  Review of Systems:  No other skin or systemic complaints except as noted in HPI or Assessment and Plan.  Objective  Well appearing patient in no apparent distress; mood and affect are within normal limits.  A focused examination was performed of the following areas: Face, scalp  Relevant exam findings are noted in the Assessment and Plan.  R nasal dorsum x 2, L glabella x 1 (3) Pink scaly macules.  vertex scalp Waxy tan crusted papule, excoriated, pt c/o of getting other similar lesions in other parts of scalp  Assessment & Plan  AK (ACTINIC KERATOSIS) (3) R nasal dorsum x 2, L glabella x 1 (3) Actinic keratoses are precancerous spots that appear secondary to cumulative UV radiation exposure/sun exposure over time. They are chronic with expected duration over 1 year. A portion of actinic keratoses will progress to squamous cell carcinoma of the skin. It is not possible to reliably predict which spots will progress to skin cancer and so treatment is recommended to prevent development of skin cancer.  Recommend daily broad spectrum sunscreen SPF 30+ to sun-exposed areas, reapply every 2 hours as needed.  Recommend staying in the shade or wearing long sleeves, sun glasses (UVA+UVB protection) and wide brim hats (4-inch brim around the entire circumference of the hat). Call for new or changing lesions. Destruction of lesion - R nasal dorsum x 2, L glabella x 1  (3)  Destruction method: cryotherapy   Informed consent: discussed and consent obtained   Lesion destroyed using liquid nitrogen: Yes   Region frozen until ice ball extended beyond lesion: Yes   Outcome: patient tolerated procedure well with no complications   Post-procedure details: wound care instructions given   Additional details:  Prior to procedure, discussed risks of blister formation, small wound, skin dyspigmentation, or rare scar following cryotherapy. Recommend Vaseline ointment to treated areas while healing.  INFLAMED SEBORRHEIC KERATOSIS vertex scalp vs Folliculitis.  Symptomatic, irritating, patient would like treated. Avoid picking.   Start ketoconazole 2% shampoo massage into scalp 2-3 times a week, let sit several minutes before rinsing 3Rf. Start Lidex scalp solution spot treat itchy spots on scalp 1-2 times a day as needed 3Rf.  Destruction of lesion - vertex scalp  Destruction method: cryotherapy   Informed consent: discussed and consent obtained   Lesion destroyed using liquid nitrogen: Yes   Region frozen until ice ball extended beyond lesion: Yes   Outcome: patient tolerated procedure well with no complications   Post-procedure details: wound care instructions given   Additional details:  Prior to procedure, discussed risks of blister formation, small wound, skin dyspigmentation, or rare scar following cryotherapy. Recommend Vaseline ointment to treated areas while healing.   ACTINIC DAMAGE WITH PRECANCEROUS ACTINIC KERATOSES Counseling for Topical Chemotherapy Management: Patient exhibits: - Severe, confluent actinic changes with pre-cancerous actinic keratoses that is secondary to cumulative UV radiation exposure over time - Condition that is severe; chronic, not at goal. - diffuse scaly  erythematous macules and papules with underlying dyspigmentation - Discussed Prescription "Field Treatment" topical Chemotherapy for Severe, Chronic Confluent Actinic  Changes with Pre-Cancerous Actinic Keratoses Field treatment involves treatment of an entire area of skin that has confluent Actinic Changes (Sun/ Ultraviolet light damage) and PreCancerous Actinic Keratoses by method of PhotoDynamic Therapy (PDT) and/or prescription Topical Chemotherapy agents such as 5-fluorouracil, 5-fluorouracil/calcipotriene, and/or imiquimod.  The purpose is to decrease the number of clinically evident and subclinical PreCancerous lesions to prevent progression to development of skin cancer by chemically destroying early precancer changes that may or may not be visible.  It has been shown to reduce the risk of developing skin cancer in the treated area. As a result of treatment, redness, scaling, crusting, and open sores may occur during treatment course. One or more than one of these methods may be used and may have to be used several times to control, suppress and eliminate the PreCancerous changes. Discussed treatment course, expected reaction, and possible side effects. - Recommend daily broad spectrum sunscreen SPF 30+ to sun-exposed areas, reapply every 2 hours as needed.  - Staying in the shade or wearing long sleeves, sun glasses (UVA+UVB protection) and wide brim hats (4-inch brim around the entire circumference of the hat) are also recommended. - Call for new or changing lesions. - Start 5-fluorouracil/calcipotriene cream twice a day for 5 - 7 days to affected areas including nose, upper lip. Patient has Rx at home. Patient provided with handout reviewing treatment course and side effects and advised to call or message [place] on MyChart with any concerns.   Reviewed course of treatment and expected reaction.  Patient advised to expect inflammation and crusting and advised that erosions are possible.  Patient advised to be diligent with sun protection during and after treatment. Counseled to keep medication out of reach of children and pets.     Return 3-4 months, for  AKs.  ICherlyn Labella, CMA, am acting as scribe for Willeen Niece, MD .   Documentation: I have reviewed the above documentation for accuracy and completeness, and I agree with the above.  Willeen Niece, MD

## 2023-05-12 ENCOUNTER — Ambulatory Visit (INDEPENDENT_AMBULATORY_CARE_PROVIDER_SITE_OTHER): Payer: 59 | Admitting: Student

## 2023-05-12 ENCOUNTER — Ambulatory Visit (INDEPENDENT_AMBULATORY_CARE_PROVIDER_SITE_OTHER): Payer: Self-pay

## 2023-05-12 ENCOUNTER — Encounter: Payer: Self-pay | Admitting: Student

## 2023-05-12 VITALS — BP 147/74 | HR 63 | Ht 61.0 in | Wt 119.2 lb

## 2023-05-12 DIAGNOSIS — Z9889 Other specified postprocedural states: Secondary | ICD-10-CM

## 2023-05-12 DIAGNOSIS — Z719 Counseling, unspecified: Secondary | ICD-10-CM

## 2023-05-12 NOTE — Progress Notes (Signed)
Patient is a 68 year old female who underwent panniculectomy with Dr. Ladona Ridgel on 02/18/2023.  Patient is almost 3 months postop.  She presents to the clinic today for postoperative follow-up.  Patient was most recently seen in the clinic on 04/14/2023.  At this visit, patient was doing well.  On exam, the abdomen was soft and nontender.  There was a small superficial wound noted to the left aspect of the incision.  It was recommended that patient apply Vaseline to her wounds and to the left drain site.  Today, patient is doing well.  She has no complaints or concerns.  She reports that she was putting Vaseline on her incisions but is not at this time.  She states that the left drain site has been feeling better.  She denies any fevers or chills.  Chaperone present on exam.  On exam, patient is sitting upright in no acute distress.  Abdomen is soft and nontender.  There is no overlying erythema.  No obvious fluid collections on exam.  Incision appears to be completely healed.  Wounds to the left side appeared to be completely resolved.  Left drain site appears to have healed well.  No signs of infection on exam.  Discussed with the patient that she may transition to scar creams at this time if she would like.  We discussed different scar cream options such as Mederma or silicone based scar cream.  Patient expressed understanding.  Patient to follow-up as needed.  I instructed her to call if she has any questions or concerns about anything.  Pictures were obtained of the patient and placed in the chart with the patient's or guardian's permission.

## 2023-08-19 ENCOUNTER — Ambulatory Visit: Payer: 59 | Admitting: Dermatology

## 2023-08-19 ENCOUNTER — Other Ambulatory Visit: Payer: Self-pay | Admitting: Dermatology

## 2023-09-17 ENCOUNTER — Ambulatory Visit: Admitting: Dermatology

## 2023-09-17 ENCOUNTER — Encounter: Payer: Self-pay | Admitting: Dermatology

## 2023-09-17 DIAGNOSIS — L578 Other skin changes due to chronic exposure to nonionizing radiation: Secondary | ICD-10-CM | POA: Diagnosis not present

## 2023-09-17 DIAGNOSIS — Z872 Personal history of diseases of the skin and subcutaneous tissue: Secondary | ICD-10-CM | POA: Diagnosis not present

## 2023-09-17 DIAGNOSIS — W908XXA Exposure to other nonionizing radiation, initial encounter: Secondary | ICD-10-CM | POA: Diagnosis not present

## 2023-09-17 DIAGNOSIS — L988 Other specified disorders of the skin and subcutaneous tissue: Secondary | ICD-10-CM

## 2023-09-17 DIAGNOSIS — L570 Actinic keratosis: Secondary | ICD-10-CM

## 2023-09-17 MED ORDER — TRETINOIN 0.1 % EX CREA: TOPICAL_CREAM | Freq: Every day | CUTANEOUS | 3 refills | Status: AC

## 2023-09-17 NOTE — Progress Notes (Signed)
 Follow-Up Visit   Subjective  [name] is a 68 y.o. female who presents for the following: Ak 29mf/u, 5FU/Calcipotriene to nose, forehead, and upper lip with good response The patient has spots, moles and lesions to be evaluated, some may be new or changing and the patient may have concern these could be cancer.   The following portions of the chart were reviewed this encounter and updated as appropriate: medications, allergies, medical history  Review of Systems:  No other skin or systemic complaints except as noted in HPI or Assessment and Plan.  Objective  Well appearing patient in no apparent distress; mood and affect are within normal limits.   A focused examination was performed of the following areas: face  Relevant exam findings are noted in the Assessment and Plan.  L nasal dorsum x 1 Pink scaly macules  Assessment & Plan     AK (ACTINIC KERATOSIS) L nasal dorsum x 1 Actinic keratoses are precancerous spots that appear secondary to cumulative UV radiation exposure/sun exposure over time. They are chronic with expected duration over 1 year. A portion of actinic keratoses will progress to squamous cell carcinoma of the skin. It is not possible to reliably predict which spots will progress to skin cancer and so treatment is recommended to prevent development of skin cancer.  Recommend daily broad spectrum sunscreen SPF 30+ to sun-exposed areas, reapply every 2 hours as needed.  Recommend staying in the shade or wearing long sleeves, sun glasses (UVA+UVB protection) and wide brim hats (4-inch brim around the entire circumference of the hat). Call for new or changing lesions. Destruction of lesion - L nasal dorsum x 1  Destruction method: cryotherapy   Informed consent: discussed and consent obtained   Lesion destroyed using liquid nitrogen: Yes   Region frozen until ice ball extended beyond lesion: Yes   Outcome: patient tolerated procedure well with no  complications   Post-procedure details: wound care instructions given   Additional details:  Prior to procedure, discussed risks of blister formation, small wound, skin dyspigmentation, or rare scar following cryotherapy. Recommend Vaseline ointment to treated areas while healing.   ACTINIC DAMAGE - chronic, secondary to cumulative UV radiation exposure/sun exposure over time - diffuse scaly erythematous macules with underlying dyspigmentation - Recommend daily broad spectrum sunscreen SPF 30+ to sun-exposed areas, reapply every 2 hours as needed.  - Recommend staying in the shade or wearing long sleeves, sun glasses (UVA+UVB protection) and wide brim hats (4-inch brim around the entire circumference of the hat). - Call for new or changing lesions.   HISTORY OF PRECANCEROUS ACTINIC KERATOSIS - site(s) of PreCancerous Actinic Keratosis clear today. 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  Treatment Plan: Cont Tretinoin  0.1% cr qhs to face as tolerated  Recommend daily broad spectrum sunscreen SPF 30+ to sun-exposed areas, reapply every 2 hours as needed. Call for new or changing lesions.  Staying in the shade or wearing long sleeves, sun glasses (UVA+UVB protection) and  wide brim hats (4-inch brim around the entire circumference of the hat) are also recommended for sun protection.   Topical retinoid medications like tretinoin /Retin-A ,  adapalene/Differin, tazarotene/Fabior, and Epiduo/Epiduo Forte can cause dryness and irritation when first started. Only apply a pea-sized amount to the entire affected area. Avoid applying it around the eyes, edges of mouth and creases at the nose. If you experience irritation, use a good moisturizer first and/or apply the medicine less often. If you are doing well with the medicine, you can increase how often you use it until you are applying every night. Be careful with sun protection while using this medication as it can make you sensitive to the sun. This medicine should not be used by pregnant women.    Return in about 6 months (around 03/18/2024) for AK f/u.  I, Rollie Clipper, RMA, am acting as scribe for Artemio Larry, MD .   Documentation: I have reviewed the above documentation for accuracy and completeness, and I agree with the above.  Artemio Larry, MD

## 2023-09-17 NOTE — Patient Instructions (Addendum)

## 2023-12-17 ENCOUNTER — Other Ambulatory Visit: Payer: Self-pay | Admitting: Dermatology

## 2023-12-24 ENCOUNTER — Other Ambulatory Visit: Payer: Self-pay | Admitting: Internal Medicine

## 2023-12-24 DIAGNOSIS — Z1231 Encounter for screening mammogram for malignant neoplasm of breast: Secondary | ICD-10-CM

## 2024-01-06 ENCOUNTER — Other Ambulatory Visit: Payer: Self-pay | Admitting: Dermatology

## 2024-02-19 ENCOUNTER — Ambulatory Visit
Admission: RE | Admit: 2024-02-19 | Discharge: 2024-02-19 | Disposition: A | Discharge status: Home / Self Care | Source: Ambulatory Visit | Attending: Internal Medicine | Admitting: Internal Medicine

## 2024-02-19 DIAGNOSIS — Z1231 Encounter for screening mammogram for malignant neoplasm of breast: Secondary | ICD-10-CM | POA: Diagnosis present

## 2024-03-09 ENCOUNTER — Ambulatory Visit: Admitting: Dermatology

## 2024-04-27 ENCOUNTER — Ambulatory Visit: Admitting: Dermatology

## 2024-05-19 ENCOUNTER — Ambulatory Visit: Admitting: Dermatology
